# Patient Record
Sex: Female | Born: 1950 | Race: White | Hispanic: No | Marital: Single | Smoking: Never smoker
Health system: Southern US, Community
[De-identification: ages and names within clinical notes are randomized; demographics above are authoritative.]

## PROBLEM LIST (undated history)

## (undated) DIAGNOSIS — F70 Mild intellectual disabilities: Secondary | ICD-10-CM

## (undated) DIAGNOSIS — F329 Major depressive disorder, single episode, unspecified: Secondary | ICD-10-CM

## (undated) DIAGNOSIS — F32A Depression, unspecified: Secondary | ICD-10-CM

## (undated) DIAGNOSIS — I10 Essential (primary) hypertension: Secondary | ICD-10-CM

## (undated) DIAGNOSIS — E785 Hyperlipidemia, unspecified: Secondary | ICD-10-CM

## (undated) DIAGNOSIS — M810 Age-related osteoporosis without current pathological fracture: Secondary | ICD-10-CM

## (undated) HISTORY — PX: ABDOMINAL HYSTERECTOMY: SHX81

---

## 1999-03-29 ENCOUNTER — Emergency Department (HOSPITAL_COMMUNITY): Admission: EM | Admit: 1999-03-29 | Discharge: 1999-03-30 | Payer: Self-pay | Admitting: Emergency Medicine

## 1999-09-13 ENCOUNTER — Emergency Department (HOSPITAL_COMMUNITY): Admission: EM | Admit: 1999-09-13 | Discharge: 1999-09-13 | Payer: Self-pay | Admitting: Emergency Medicine

## 1999-09-20 ENCOUNTER — Emergency Department (HOSPITAL_COMMUNITY): Admission: EM | Admit: 1999-09-20 | Discharge: 1999-09-20 | Payer: Self-pay | Admitting: Emergency Medicine

## 2000-11-08 ENCOUNTER — Encounter (INDEPENDENT_AMBULATORY_CARE_PROVIDER_SITE_OTHER): Payer: Self-pay | Admitting: Specialist

## 2000-11-08 ENCOUNTER — Ambulatory Visit (HOSPITAL_COMMUNITY): Admission: RE | Admit: 2000-11-08 | Discharge: 2000-11-08 | Payer: Self-pay | Admitting: Gastroenterology

## 2002-11-20 ENCOUNTER — Ambulatory Visit (HOSPITAL_COMMUNITY): Admission: RE | Admit: 2002-11-20 | Discharge: 2002-11-20 | Payer: Self-pay | Admitting: Family Medicine

## 2002-11-29 ENCOUNTER — Encounter: Payer: Self-pay | Admitting: Emergency Medicine

## 2002-11-29 ENCOUNTER — Emergency Department (HOSPITAL_COMMUNITY): Admission: EM | Admit: 2002-11-29 | Discharge: 2002-11-29 | Payer: Self-pay | Admitting: Emergency Medicine

## 2002-12-23 ENCOUNTER — Ambulatory Visit (HOSPITAL_COMMUNITY): Admission: RE | Admit: 2002-12-23 | Discharge: 2002-12-23 | Payer: Self-pay | Admitting: Family Medicine

## 2002-12-23 ENCOUNTER — Encounter: Payer: Self-pay | Admitting: Family Medicine

## 2003-03-19 ENCOUNTER — Ambulatory Visit (HOSPITAL_COMMUNITY): Admission: RE | Admit: 2003-03-19 | Discharge: 2003-03-19 | Payer: Self-pay | Admitting: Family Medicine

## 2003-03-19 ENCOUNTER — Encounter: Payer: Self-pay | Admitting: Family Medicine

## 2005-04-18 ENCOUNTER — Ambulatory Visit (HOSPITAL_COMMUNITY): Admission: RE | Admit: 2005-04-18 | Discharge: 2005-04-18 | Payer: Self-pay | Admitting: *Deleted

## 2005-04-18 ENCOUNTER — Encounter (INDEPENDENT_AMBULATORY_CARE_PROVIDER_SITE_OTHER): Payer: Self-pay | Admitting: Specialist

## 2006-03-12 ENCOUNTER — Ambulatory Visit (HOSPITAL_COMMUNITY): Admission: RE | Admit: 2006-03-12 | Discharge: 2006-03-12 | Payer: Self-pay | Admitting: Family Medicine

## 2007-04-09 ENCOUNTER — Ambulatory Visit (HOSPITAL_COMMUNITY): Admission: RE | Admit: 2007-04-09 | Discharge: 2007-04-09 | Payer: Self-pay | Admitting: *Deleted

## 2008-04-07 ENCOUNTER — Ambulatory Visit (HOSPITAL_COMMUNITY): Admission: RE | Admit: 2008-04-07 | Discharge: 2008-04-07 | Payer: Self-pay | Admitting: Family Medicine

## 2008-09-01 ENCOUNTER — Encounter: Admission: RE | Admit: 2008-09-01 | Discharge: 2008-09-01 | Payer: Self-pay | Admitting: Family Medicine

## 2008-09-09 ENCOUNTER — Encounter: Admission: RE | Admit: 2008-09-09 | Discharge: 2008-09-09 | Payer: Self-pay | Admitting: Family Medicine

## 2008-09-21 ENCOUNTER — Encounter: Admission: RE | Admit: 2008-09-21 | Discharge: 2008-09-21 | Payer: Self-pay | Admitting: Gastroenterology

## 2008-09-21 ENCOUNTER — Encounter: Admission: RE | Admit: 2008-09-21 | Discharge: 2008-09-21 | Payer: Self-pay | Admitting: Family Medicine

## 2009-09-13 ENCOUNTER — Encounter: Admission: RE | Admit: 2009-09-13 | Discharge: 2009-09-13 | Payer: Self-pay | Admitting: Family Medicine

## 2010-06-13 ENCOUNTER — Ambulatory Visit (HOSPITAL_COMMUNITY): Admission: RE | Admit: 2010-06-13 | Discharge: 2010-06-13 | Payer: Self-pay | Admitting: Family Medicine

## 2010-08-07 ENCOUNTER — Encounter: Payer: Self-pay | Admitting: Family Medicine

## 2010-08-23 ENCOUNTER — Other Ambulatory Visit: Payer: Self-pay | Admitting: Family Medicine

## 2010-08-23 DIAGNOSIS — Z1231 Encounter for screening mammogram for malignant neoplasm of breast: Secondary | ICD-10-CM

## 2010-09-21 ENCOUNTER — Ambulatory Visit
Admission: RE | Admit: 2010-09-21 | Discharge: 2010-09-21 | Disposition: A | Discharge status: Home / Self Care | Payer: Self-pay | Source: Ambulatory Visit | Attending: Family Medicine | Admitting: Family Medicine

## 2010-09-21 DIAGNOSIS — Z1231 Encounter for screening mammogram for malignant neoplasm of breast: Secondary | ICD-10-CM

## 2010-09-23 ENCOUNTER — Other Ambulatory Visit: Payer: Self-pay | Admitting: Family Medicine

## 2010-09-23 DIAGNOSIS — R928 Other abnormal and inconclusive findings on diagnostic imaging of breast: Secondary | ICD-10-CM

## 2010-10-05 ENCOUNTER — Ambulatory Visit
Admission: RE | Admit: 2010-10-05 | Discharge: 2010-10-05 | Disposition: A | Discharge status: Home / Self Care | Payer: Self-pay | Source: Ambulatory Visit | Attending: Family Medicine | Admitting: Family Medicine

## 2010-10-05 DIAGNOSIS — R928 Other abnormal and inconclusive findings on diagnostic imaging of breast: Secondary | ICD-10-CM

## 2010-12-02 NOTE — Op Note (Signed)
[place]  Patient:    AAVA, DELAND                       MRN: [id number] Proc. Date: 11/08/00 Adm. Date:  [id number] Attending:  Orland Mustard CC:         Chales Salmon. Abigail Miyamoto, M.D.   Operative Report  PROCEDURE:  Colonoscopy and coagulation of polyps.  MEDICATIONS:  Fentanyl 50 mcg, Versed 5 mg IV.  INDICATION:  Polyps found on sigmoidoscopy in the rectum.  DESCRIPTION OF PROCEDURE:  The procedure had been explained to the patient prior to returning and consent obtained.  Placed in the left lateral decubitus position.  The Olympus adult video colonoscope inserted and advanced under direct visualization.  The prep was quite good.  We were able to advance to the cecum without difficulty.  Right lower quadrant transilluminated, ileocecal valve seen.  Scope withdrawn, and cecum, ascending colon, hepatic flexure, transverse colon, splenic flexure, descending, and sigmoid colon seen well.  In the rectum were two small 3-4 mm polyps that were cauterized and placed in a single jar.  The patient tolerated the procedure well, was maintained on low-flow oxygen and pulse oximetry.  ASSESSMENT:  Two small rectal polyps, cauterized.  PLAN:  Check pathology.  Routine postpolypectomy instructions. DD:  11/08/00 TD:  11/09/00 Job: [id number]

## 2010-12-02 NOTE — Op Note (Signed)
Amy Solis, Amy Solis                ACCOUNT NO.:  [id number]   MEDICAL RECORD NO.:  [id number]          PATIENT TYPE:  AMB   LOCATION:  SDC                           FACILITY:  [facility]   PHYSICIAN:  [place] B. Earlene Plater, M.D.  DATE OF BIRTH:  1951-07-06   DATE OF PROCEDURE:  04/18/2005  DATE OF DISCHARGE:                                 OPERATIVE REPORT   PREOPERATIVE DIAGNOSIS:  Persistent simple ovarian cyst.   POSTOPERATIVE DIAGNOSIS:  Persistent simple ovarian cyst.   PROCEDURE:  Laparoscopic bilateral salpingo-oophorectomy.   SURGEON:  Chester Holstein. Earlene Plater, M.D.   ASSISTANT:  Richardean Sale, M.D.   ANESTHESIA:  General.   SPECIMENS:  Bilateral tubes and ovaries.   ESTIMATED BLOOD LOSS:  Minimal.   COMPLICATIONS:  None.   INDICATIONS:  Patient with a history of a persistent simple-appearing  adnexal cyst.  Laterality was not clear on ultrasound but was suggestive of  emanating from the right.  Regardless, the patient is menopausal and we plan  to proceed with bilateral salpingo-oophorectomy.  Preop CA125 was normal.  Ultrasound appearance was of a simple 8 cm cyst with no complexity  whatsoever and no increased blood flow.  The patient had been counseled  preoperatively that there was a very low risk of malignancy and unless  intraoperative findings changed that opinion, we would proceed  laparoscopically.  Risks of surgery discussed including bleeding, infection  and damage to surrounding organs.   PROCEDURE:  The patient was taken to the operating room and general  anesthesia obtained.  Placed in the Perkins stirrups and prepped and draped in  standard fashion.  A Foley catheter inserted in the bladder.   A 10 mm incision placed in the umbilicus, carried sharply to the fascia.  The fascia was divided sharply and elevated with Kocher clamps.  Posterior  sheath and the peritoneum elevated with an Allis clamp and entered sharply  with a knife.  A pursestring suture of 0 Vicryl placed  around the fascial  defect.  Hasson cannula inserted and secured.  Pneumoperitoneum attained  with CO2 gas.  Five millimeter ports were placed in the midline  suprapubically and right lower quadrant.  An 11 mm XL trocar placed in the  left lower quadrant, all under direct laparoscopic visualization.   Trendelenburg position obtained, bowel mobilized superiorly.  There were  filmy adhesions from the sigmoid colon to the left adnexa.  This was taken  down sharply.  With inspection of the pelvis, it was determined that the  cystic mass was emanating from the left tube as a large benign simple-  appearing paratubal cyst.  Both ovaries appeared completely normal, as did  the anterior and posterior cul-de-sac, uterus, appendix and upper abdomen.  There was no sign of malignancy whatsoever.  Pelvic washings were obtained.   The left paratubal cyst was incised at its dome and drained with suction.  The left adnexa was then placed on traction.  Course of the left ureter  identified.  The IP ligament cauterized with bipolar and divided.  Dissection continued toward the uterine cornu.  The  tube and the uterine-  ovarian ligament were cauterized and divided.  The specimen was removed in  an Endobag.   Attention turned to the right ovary.  The course of the right ureter  identified and found to be well away.  Right IP ligament placed on traction,  cauterized with bipolar and divided.  Dissection continued back up to the  uterine-ovarian ligament and left tube.  These were all divided with  electrocauterization with bipolar.   The pelvis was irrigated and sites of dissection inspected, and they were  hemostatic.  The trocars were removed and their sites inspected.  Each was  hemostatic.  As the 11 mm port was an XL trocar placed in a Z-track fashion,  fascial closure was not deemed necessary.   Gas removed and hasson canula removed.  Fascia closed with the pursestring  suture.  The skin at the  umbilicus was closed with a layer of running  subcuticular 4-0 Vicryl.  The superior ports were closed with Dermabond.   The patient tolerated the procedure well, and there were no complications.  She was taken to the recovery room awake, alert and in stable condition.      Gerri Spore B. Earlene Plater, M.D.  Electronically Signed     WBD/MEDQ  D:  04/18/2005  T:  04/18/2005  Job:  [id number]

## 2011-08-02 ENCOUNTER — Other Ambulatory Visit: Payer: Self-pay | Admitting: Family Medicine

## 2011-08-02 DIAGNOSIS — Z1231 Encounter for screening mammogram for malignant neoplasm of breast: Secondary | ICD-10-CM

## 2011-09-26 ENCOUNTER — Ambulatory Visit
Admission: RE | Admit: 2011-09-26 | Discharge: 2011-09-26 | Disposition: A | Discharge status: Home / Self Care | Payer: Self-pay | Source: Ambulatory Visit | Attending: Family Medicine | Admitting: Family Medicine

## 2011-09-26 DIAGNOSIS — Z1231 Encounter for screening mammogram for malignant neoplasm of breast: Secondary | ICD-10-CM

## 2013-04-25 ENCOUNTER — Other Ambulatory Visit: Payer: Self-pay | Admitting: Nurse Practitioner

## 2013-04-25 DIAGNOSIS — Z1231 Encounter for screening mammogram for malignant neoplasm of breast: Secondary | ICD-10-CM

## 2013-05-13 ENCOUNTER — Ambulatory Visit: Payer: Self-pay

## 2013-05-15 ENCOUNTER — Ambulatory Visit
Admission: RE | Admit: 2013-05-15 | Discharge: 2013-05-15 | Disposition: A | Discharge status: Home / Self Care | Payer: BC Managed Care – PPO | Source: Ambulatory Visit | Attending: Nurse Practitioner | Admitting: Nurse Practitioner

## 2013-05-15 DIAGNOSIS — Z1231 Encounter for screening mammogram for malignant neoplasm of breast: Secondary | ICD-10-CM

## 2013-06-16 ENCOUNTER — Emergency Department (HOSPITAL_COMMUNITY): Payer: BC Managed Care – PPO

## 2013-06-16 ENCOUNTER — Encounter (HOSPITAL_COMMUNITY): Payer: Self-pay | Admitting: Emergency Medicine

## 2013-06-16 ENCOUNTER — Emergency Department (HOSPITAL_COMMUNITY)
Admission: EM | Admit: 2013-06-16 | Discharge: 2013-06-16 | Disposition: A | Discharge status: Home / Self Care | Payer: BC Managed Care – PPO | Attending: Emergency Medicine | Admitting: Emergency Medicine

## 2013-06-16 DIAGNOSIS — W010XXA Fall on same level from slipping, tripping and stumbling without subsequent striking against object, initial encounter: Secondary | ICD-10-CM

## 2013-06-16 DIAGNOSIS — M810 Age-related osteoporosis without current pathological fracture: Secondary | ICD-10-CM | POA: Insufficient documentation

## 2013-06-16 DIAGNOSIS — I10 Essential (primary) hypertension: Secondary | ICD-10-CM | POA: Insufficient documentation

## 2013-06-16 DIAGNOSIS — Z79899 Other long term (current) drug therapy: Secondary | ICD-10-CM | POA: Insufficient documentation

## 2013-06-16 DIAGNOSIS — S0003XA Contusion of scalp, initial encounter: Secondary | ICD-10-CM | POA: Insufficient documentation

## 2013-06-16 DIAGNOSIS — Y9210 Unspecified residential institution as the place of occurrence of the external cause: Secondary | ICD-10-CM | POA: Insufficient documentation

## 2013-06-16 DIAGNOSIS — W1809XA Striking against other object with subsequent fall, initial encounter: Secondary | ICD-10-CM | POA: Insufficient documentation

## 2013-06-16 DIAGNOSIS — Z7982 Long term (current) use of aspirin: Secondary | ICD-10-CM | POA: Insufficient documentation

## 2013-06-16 DIAGNOSIS — F79 Unspecified intellectual disabilities: Secondary | ICD-10-CM | POA: Insufficient documentation

## 2013-06-16 DIAGNOSIS — S0093XA Contusion of unspecified part of head, initial encounter: Secondary | ICD-10-CM

## 2013-06-16 DIAGNOSIS — Y939 Activity, unspecified: Secondary | ICD-10-CM | POA: Insufficient documentation

## 2013-06-16 HISTORY — DX: Depression, unspecified: F32.A

## 2013-06-16 HISTORY — DX: Age-related osteoporosis without current pathological fracture: M81.0

## 2013-06-16 HISTORY — DX: Mild intellectual disabilities: F70

## 2013-06-16 HISTORY — DX: Hyperlipidemia, unspecified: E78.5

## 2013-06-16 HISTORY — DX: Essential (primary) hypertension: I10

## 2013-06-16 HISTORY — DX: Major depressive disorder, single episode, unspecified: F32.9

## 2013-06-16 LAB — CBC
HCT: 41.2 % (ref 36.0–46.0)
Hemoglobin: 13.9 g/dL (ref 12.0–15.0)
MCH: 31.9 pg (ref 26.0–34.0)
MCHC: 33.7 g/dL (ref 30.0–36.0)
MCV: 94.5 fL (ref 78.0–100.0)
Platelets: 264 10*3/uL (ref 150–400)
RBC: 4.36 MIL/uL (ref 3.87–5.11)
WBC: 3.7 10*3/uL — ABNORMAL LOW (ref 4.0–10.5)

## 2013-06-16 LAB — BLOOD GAS, ARTERIAL
Acid-Base Excess: 1.7 mmol/L (ref 0.0–2.0)
Bicarbonate: 24.9 mEq/L — ABNORMAL HIGH (ref 20.0–24.0)
Drawn by: 310571
O2 Saturation: 92.4 %
pCO2 arterial: 36.5 mmHg (ref 35.0–45.0)
pH, Arterial: 7.449 (ref 7.350–7.450)

## 2013-06-16 LAB — COMPREHENSIVE METABOLIC PANEL
ALT: 16 U/L (ref 0–35)
AST: 17 U/L (ref 0–37)
CO2: 24 mEq/L (ref 19–32)
Calcium: 9.7 mg/dL (ref 8.4–10.5)
Chloride: 105 mEq/L (ref 96–112)
Creatinine, Ser: 0.8 mg/dL (ref 0.50–1.10)
GFR calc Af Amer: 88 mL/min — ABNORMAL LOW (ref >90)
GFR calc non Af Amer: 76 mL/min — ABNORMAL LOW (ref >90)
Glucose, Bld: 107 mg/dL — ABNORMAL HIGH (ref 70–99)
Total Bilirubin: 0.5 mg/dL (ref 0.3–1.2)

## 2013-06-16 NOTE — ED Notes (Signed)
Pt Oxygen sat 86%, called group home to check if pt was on oxygen there or any respiratory issues but pt has no respiratory issues or O2 at home. Pt denies SOB.

## 2013-06-16 NOTE — ED Provider Notes (Signed)
CSN: [id number]     Arrival date & time 06/16/13  7253 History   First MD Initiated Contact with Patient 06/16/13 [phone]     Chief Complaint  Patient presents with  . Fall   (Consider location/radiation/quality/duration/timing/severity/associated sxs/prior Treatment) Patient is a 62 y.o. female presenting with fall. The history is provided by the patient and a caregiver.  Fall Associated symptoms include headaches. Pertinent negatives include no chest pain, no abdominal pain and no shortness of breath.  pt with hx htn, mr, from group home. Fall at group home today. Was standing and fell back, hit head, dazed, c/o headache since, moderate. Staff at group home also report fall in past couple weeks where hit head very hard on counter - pt cant recall specifics of that fall. At baseline pt walks w walker, somewhat slow/unsteadily, fall risk.  Pt denies neck or back pain. No nv. No numbness/weakness. Pt denies any other pain or injury.  No anticoag use.  States otherwise recent health at baseline w exception that had recent cough/congestion. 'cold', but pt/staff feels those symptoms have improved. No fever.      Past Medical History  Diagnosis Date  . Hypertension   . Hyperlipemia   . Depressive disorder   . Osteoporosis   . Mild mental retardation    History reviewed. No pertinent past surgical history. No family history on file. History  Substance Use Topics  . Smoking status: Never Smoker   . Smokeless tobacco: Not on file  . Alcohol Use: No   OB History   Grav Para Term Preterm Abortions TAB SAB Ect Mult Living                 Review of Systems  Constitutional: Negative for fever and chills.  HENT: Negative for sore throat.   Eyes: Negative for visual disturbance.  Respiratory: Negative for shortness of breath.   Cardiovascular: Negative for chest pain.  Gastrointestinal: Negative for vomiting and abdominal pain.  Genitourinary: Negative for flank pain.  Musculoskeletal:  Negative for back pain and neck pain.  Skin: Negative for wound.  Neurological: Positive for headaches. Negative for weakness and numbness.  Hematological: Does not bruise/bleed easily.  Psychiatric/Behavioral: Negative for confusion.    Allergies  Review of patient's allergies indicates no known allergies.  Home Medications   Current Outpatient Rx  Name  Route  Sig  Dispense  Refill  . amLODipine-benazepril (LOTREL) 5-20 MG per capsule   Oral   Take 1 capsule by mouth daily.         Marland Kitchen ammonium lactate (LAC-HYDRIN) 12 % lotion   Topical   Apply 1 application topically 2 (two) times daily.         Marland Kitchen aspirin 81 MG tablet   Oral   Take 81 mg by mouth daily.         . Calcium Carb-Cholecalciferol (CALCIUM 600 + D PO)   Oral   Take 1 tablet by mouth 2 (two) times daily.         Marland Kitchen gabapentin (NEURONTIN) 800 MG tablet   Oral   Take 0.5 tablets by mouth 2 (two) times daily.         . Multiple Vitamins-Minerals (CEROVITE PO)   Oral   Take 1 tablet by mouth daily.         Marland Kitchen omeprazole (PRILOSEC) 20 MG capsule   Oral   Take 1 capsule by mouth daily.         Marland Kitchen oxybutynin (DITROPAN)  5 MG tablet   Oral   Take 1 tablet by mouth daily.         Marland Kitchen PARoxetine (PAXIL) 40 MG tablet   Oral   Take 1 tablet by mouth daily.         Bertram Gala Glycol-Propyl Glycol (SYSTANE) 0.4-0.3 % SOLN   Ophthalmic   Apply 1 drop to eye 2 (two) times daily.         . risperiDONE (RISPERDAL) 1 MG tablet   Oral   Take 1 tablet by mouth daily.         . simvastatin (ZOCOR) 20 MG tablet   Oral   Take 1 tablet by mouth daily.          BP 122/77  Pulse 86  Temp(Src) 97.9 F (36.6 C) (Oral)  Resp 16  SpO2 85% Physical Exam  Nursing note and vitals reviewed. Constitutional: She appears well-developed and well-nourished. No distress.  HENT:  Mouth/Throat: Oropharynx is clear and moist.  Contusion, tenderness posterior scalp.   Eyes: Conjunctivae are normal. Pupils  are equal, round, and reactive to light. No scleral icterus.  Neck: Normal range of motion. Neck supple. No tracheal deviation present.  Cardiovascular: Normal rate, normal heart sounds and intact distal pulses.  Exam reveals no gallop and no friction rub.   No murmur heard. Pulmonary/Chest: Effort normal and breath sounds normal. No respiratory distress.  Abdominal: Soft. Normal appearance. She exhibits no distension. There is no tenderness.  Musculoskeletal: She exhibits no edema and no tenderness.  CTLS spine, non tender, aligned, no step off. Good rom bil extremities without pain or focal tenderness.   Neurological: She is alert.  Alert, smiling, pleasant, responds to questions.  Mental status c/w baseline per caregiver/staff.   Motor intact bil, 5/5.   Skin: Skin is warm and dry. No rash noted. She is not diaphoretic.  Psychiatric: She has a normal mood and affect.    ED Course  Procedures (including critical care time)  EKG Interpretation   None      Results for orders placed during the hospital encounter of 06/16/13  CBC      Result Value Range   WBC 3.7 (*) 4.0 - 10.5 K/uL   RBC 4.36  3.87 - 5.11 MIL/uL   Hemoglobin 13.9  12.0 - 15.0 g/dL   HCT 16.1  09.6 - 04.5 %   MCV 94.5  78.0 - 100.0 fL   MCH 31.9  26.0 - 34.0 pg   MCHC 33.7  30.0 - 36.0 g/dL   RDW 40.9  81.1 - 91.4 %   Platelets 264  150 - 400 K/uL  COMPREHENSIVE METABOLIC PANEL      Result Value Range   Sodium 139  135 - 145 mEq/L   Potassium 4.1  3.5 - 5.1 mEq/L   Chloride 105  96 - 112 mEq/L   CO2 24  19 - 32 mEq/L   Glucose, Bld 107 (*) 70 - 99 mg/dL   BUN 17  6 - 23 mg/dL   Creatinine, Ser 7.82  0.50 - 1.10 mg/dL   Calcium 9.7  8.4 - 95.6 mg/dL   Total Protein 6.8  6.0 - 8.3 g/dL   Albumin 3.3 (*) 3.5 - 5.2 g/dL   AST 17  0 - 37 U/L   ALT 16  0 - 35 U/L   Alkaline Phosphatase 83  39 - 117 U/L   Total Bilirubin 0.5  0.3 - 1.2 mg/dL   GFR  calc non Af Amer 76 (*) >90 mL/min   GFR calc Af Amer 88  (*) >90 mL/min  BLOOD GAS, ARTERIAL      Result Value Range   pH, Arterial 7.449  7.350 - 7.450   pCO2 arterial 36.5  35.0 - 45.0 mmHg   pO2, Arterial 62.7 (*) 80.0 - 100.0 mmHg   Bicarbonate 24.9 (*) 20.0 - 24.0 mEq/L   TCO2 21.6  0 - 100 mmol/L   Acid-Base Excess 1.7  0.0 - 2.0 mmol/L   O2 Saturation 92.4     Patient temperature 98.6     Collection site LEFT RADIAL     Drawn by [id number]     Sample type ARTERIAL DRAW     Allens test (pass/fail) PASS  PASS   Dg Chest 2 View  06/16/2013   CLINICAL DATA:  Fall  EXAM: CHEST  2 VIEW  COMPARISON:  None.  FINDINGS: Cardiomediastinal silhouette is unremarkable. No acute infiltrate or pulmonary edema. Mild basilar atelectasis. Bony thorax is unremarkable.  IMPRESSION: No acute infiltrate or pulmonary edema.  Mild basilar atelectasis.   Electronically Signed   By: [name] Mead M.D.   On: 06/16/2013 10:59   Ct Head Wo Contrast  06/16/2013   CLINICAL DATA:  Head injury and pain after fall.  EXAM: CT HEAD WITHOUT CONTRAST  TECHNIQUE: Contiguous axial images were obtained from the base of the skull through the vertex without intravenous contrast.  COMPARISON:  Report of MRI of March 19, 2003. Images are not available.  FINDINGS: Bony calvarium appears intact. As noted on prior MRI exam, there appears to be partial agenesis of the corpus callosum. No mass effect or midline shift is noted. Two focal low densities are noted in the left cerebellar hemisphere consistent with old infarctions. There is no evidence of mass lesion, hemorrhage or acute infarction.  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 No acute intracranial abnormality seen.   Electronically Signed   By: Roque Lias M.D.   On: 06/16/2013 11:06      MDM  Ct.  Nursing notes low pulse ox on a couple different monitors, although small/scarred nails, some w polish. Will add cxr and labs. Pt  denies dyspnea.   Reviewed nursing notes and prior charts for additional history.   On room air abg, sat 92%, po 63.   Checked w pt on several occasions, pt denies any sob, or unusual doe or fatigue.  Pt states she feels she is breathing at baseline. Color good/normal.   Pt presents w mech fall, workup neg for acute traumatic inj.   Pt continues to deny any dyspnea/asymptomatic from resp standpoint - given marginal/low po2, borderline hypoxia on room air abg, will have f/u as outpt w pcp Cyndia Bent).     Suzi Roots, MD 06/16/13 [phone]

## 2013-06-16 NOTE — ED Notes (Signed)
Pt from group home. Pt was standing today and just fell back wards hitting back of head. Pt's caregiver states no LOC. Pt denies pain.

## 2013-06-22 ENCOUNTER — Encounter (HOSPITAL_COMMUNITY): Payer: Self-pay | Admitting: Emergency Medicine

## 2013-06-22 ENCOUNTER — Emergency Department (HOSPITAL_COMMUNITY): Payer: BC Managed Care – PPO

## 2013-06-22 ENCOUNTER — Emergency Department (HOSPITAL_COMMUNITY)
Admission: EM | Admit: 2013-06-22 | Discharge: 2013-06-22 | Disposition: A | Discharge status: Home / Self Care | Payer: BC Managed Care – PPO | Attending: Emergency Medicine | Admitting: Emergency Medicine

## 2013-06-22 DIAGNOSIS — L988 Other specified disorders of the skin and subcutaneous tissue: Secondary | ICD-10-CM | POA: Insufficient documentation

## 2013-06-22 DIAGNOSIS — F329 Major depressive disorder, single episode, unspecified: Secondary | ICD-10-CM | POA: Insufficient documentation

## 2013-06-22 DIAGNOSIS — Z7982 Long term (current) use of aspirin: Secondary | ICD-10-CM | POA: Insufficient documentation

## 2013-06-22 DIAGNOSIS — E785 Hyperlipidemia, unspecified: Secondary | ICD-10-CM | POA: Insufficient documentation

## 2013-06-22 DIAGNOSIS — F3289 Other specified depressive episodes: Secondary | ICD-10-CM | POA: Insufficient documentation

## 2013-06-22 DIAGNOSIS — R0902 Hypoxemia: Secondary | ICD-10-CM

## 2013-06-22 DIAGNOSIS — Z79899 Other long term (current) drug therapy: Secondary | ICD-10-CM | POA: Insufficient documentation

## 2013-06-22 DIAGNOSIS — I10 Essential (primary) hypertension: Secondary | ICD-10-CM | POA: Insufficient documentation

## 2013-06-22 DIAGNOSIS — Z8739 Personal history of other diseases of the musculoskeletal system and connective tissue: Secondary | ICD-10-CM | POA: Insufficient documentation

## 2013-06-22 LAB — BASIC METABOLIC PANEL
BUN: 15 mg/dL (ref 6–23)
Calcium: 9.5 mg/dL (ref 8.4–10.5)
Chloride: 103 mEq/L (ref 96–112)
Creatinine, Ser: 0.76 mg/dL (ref 0.50–1.10)
GFR calc non Af Amer: 87 mL/min — ABNORMAL LOW (ref >90)
Glucose, Bld: 94 mg/dL (ref 70–99)
Sodium: 134 mEq/L — ABNORMAL LOW (ref 135–145)

## 2013-06-22 LAB — CBC WITH DIFFERENTIAL/PLATELET
Basophils Absolute: 0 10*3/uL (ref 0.0–0.1)
Eosinophils Absolute: 0.1 10*3/uL (ref 0.0–0.7)
Eosinophils Relative: 3 % (ref 0–5)
Lymphocytes Relative: 38 % (ref 12–46)
MCH: 32.9 pg (ref 26.0–34.0)
MCV: 95.2 fL (ref 78.0–100.0)
Monocytes Absolute: 0.7 10*3/uL (ref 0.1–1.0)
Monocytes Relative: 15 % — ABNORMAL HIGH (ref 3–12)
Platelets: 234 10*3/uL (ref 150–400)
RDW: 12.8 % (ref 11.5–15.5)

## 2013-06-22 LAB — BLOOD GAS, VENOUS
Acid-Base Excess: 1.6 mmol/L (ref 0.0–2.0)
Patient temperature: 98.6
TCO2: 23.1 mmol/L (ref 0–100)
pCO2, Ven: 44.6 mmHg — ABNORMAL LOW (ref 45.0–50.0)

## 2013-06-22 LAB — CARBOXYHEMOGLOBIN
Methemoglobin: 1.8 % — ABNORMAL HIGH (ref 0.0–1.5)
O2 Saturation: 63.4 %
Total hemoglobin: 16 g/dL (ref 12.0–16.0)

## 2013-06-22 NOTE — ED Notes (Signed)
Pt was brought in from nursing home with staff they reported that pts ox level was low. When checked it was 95%  RA. Pt denies any sob, pt denies any c/o . The tech with her states that she is her for SOB.

## 2013-06-22 NOTE — ED Provider Notes (Signed)
CSN: [id number]     Arrival date & time 06/22/13  1341 History   First MD Initiated Contact with Patient 06/22/13 1507     Chief Complaint  Patient presents with  . Shortness of Breath   HPI  Patient p/w staff concerns of possible hypoxia and new discoloration about the R third digit. Patient lives at a group home, and staff provides much of HPI. Patient was seen one week ago for a fall and found to have borderline hypoxia during that evaluation. Staff has not taken the patient to her PMD since that evaluation (one of the discharge instructions.) The patient herself has no complaints.  (Staff states that this is typical for this lady). Staff denies new cough, fever, emesis, diarrhea, behavior changes.  The patient does note that her R third digit seems "darker".  No pain, no sensory loss, no other affected digits.  Past Medical History  Diagnosis Date  . Hypertension   . Hyperlipemia   . Depressive disorder   . Osteoporosis   . Mild mental retardation    History reviewed. No pertinent past surgical history. No family history on file. History  Substance Use Topics  . Smoking status: Never Smoker   . Smokeless tobacco: Not on file  . Alcohol Use: No   OB History   Grav Para Term Preterm Abortions TAB SAB Ect Mult Living                 Review of Systems  Unable to perform ROS: Other  Patient with retardation - LEVEL FIVE  Allergies  Review of patient's allergies indicates no known allergies.  Home Medications   Current Outpatient Rx  Name  Route  Sig  Dispense  Refill  . amLODipine-benazepril (LOTREL) 5-20 MG per capsule   Oral   Take 1 capsule by mouth daily.         Marland Kitchen ammonium lactate (LAC-HYDRIN) 12 % lotion   Topical   Apply 1 application topically 2 (two) times daily.         Marland Kitchen aspirin 81 MG tablet   Oral   Take 81 mg by mouth daily.         . Calcium Carb-Cholecalciferol (CALCIUM 600 + D PO)   Oral   Take 1 tablet by mouth 2 (two) times  daily.         Marland Kitchen gabapentin (NEURONTIN) 800 MG tablet   Oral   Take 400 tablets by mouth 2 (two) times daily.          . Multiple Vitamins-Minerals (CEROVITE PO)   Oral   Take 1 tablet by mouth daily.         Marland Kitchen omeprazole (PRILOSEC) 20 MG capsule   Oral   Take 1 capsule by mouth daily.         Marland Kitchen oxybutynin (DITROPAN) 5 MG tablet   Oral   Take 1 tablet by mouth daily.         Marland Kitchen PARoxetine (PAXIL) 40 MG tablet   Oral   Take 1 tablet by mouth daily.         Bertram Gala Glycol-Propyl Glycol (SYSTANE) 0.4-0.3 % SOLN   Ophthalmic   Apply 1 drop to eye 2 (two) times daily.         . risperiDONE (RISPERDAL) 1 MG tablet   Oral   Take 1 tablet by mouth daily.         . simvastatin (ZOCOR) 20 MG tablet   Oral  Take 1 tablet by mouth daily.          BP 151/93  Pulse 96  Temp(Src) 97.5 F (36.4 C) (Oral)  SpO2 95% Physical Exam  Nursing note and vitals reviewed. Constitutional: She is oriented to person, place, and time. She appears well-developed and well-nourished. No distress.  HENT:  Head: Normocephalic and atraumatic.  Eyes: Conjunctivae and EOM are normal.  Cardiovascular: Normal rate, regular rhythm, intact distal pulses and normal pulses.   Cap refill in affected digit is appropriate.  Pulmonary/Chest: Effort normal and breath sounds normal. No stridor. No respiratory distress.  Abdominal: She exhibits no distension.  Musculoskeletal: She exhibits no edema.  Neurological: She is alert and oriented to person, place, and time. No cranial nerve deficit.  Skin: Skin is warm and dry.     Psychiatric: She has a normal mood and affect. Her speech is normal and behavior is normal. Cognition and memory are impaired.    ED Course  Procedures (including critical care time) Labs Review Labs Reviewed  CBC WITH DIFFERENTIAL - Abnormal; Notable for the following:    Hemoglobin 15.9 (*)    Monocytes Relative 15 (*)    All other components within normal  limits  BLOOD GAS, VENOUS  BASIC METABOLIC PANEL  CARBOXYHEMOGLOBIN   Imaging Review Dg Chest 2 View  06/22/2013   CLINICAL DATA:  Hypoxia  EXAM: CHEST  2 VIEW  COMPARISON:  06/16/2013  FINDINGS: Study is limited by poor inspiration. Cardiomegaly again noted. Hazy bilateral basilar atelectasis or infiltrate. No pulmonary edema. Small amount of fluid or thickening noted right minor fissure.  IMPRESSION: Limited study by poor inspiration. Hazy bilateral basilar atelectasis or infiltrate. No pulmonary edema.   Electronically Signed   By: [name] Mead M.D.   On: 06/22/2013 15:53    EKG Interpretation   None      After the initial eval I reviewed the E-chart.   O2- 92%ra, low / abnormal  7:45 PM Patient remains in in no distress.  Oxygen saturation is improved. MDM  No diagnosis found. This patient presents with concern for possible hypoxic.  Notably, the patient is afebrile, with no complaints.  However, given the patient's mental retardation, differential was considered, given the initial abnormal vital signs as well.  Patient remained stable throughout her emergency department course.  Repeat x-ray did not demonstrate significant opacification, and the patient is afebrile, with no cough suggesting infection.  Patient has previous did not follow up with her primary care physician, in spite of explicit discharge instructions 1 week ago.  The importance primary care followup in the coming days was reiterated to the patient and her accompanying staff member.  Given the absence of distress, currently normal vital signs, the reassuring laboratory findings, she was discharged in stable condition to follow up with her primary care physician.    Gerhard Munch, MD 06/22/13 [phone]

## 2014-02-23 ENCOUNTER — Other Ambulatory Visit: Payer: Self-pay | Admitting: Family Medicine

## 2014-02-23 DIAGNOSIS — Z1231 Encounter for screening mammogram for malignant neoplasm of breast: Secondary | ICD-10-CM

## 2014-05-20 ENCOUNTER — Encounter (INDEPENDENT_AMBULATORY_CARE_PROVIDER_SITE_OTHER): Payer: Self-pay

## 2014-05-20 ENCOUNTER — Ambulatory Visit
Admission: RE | Admit: 2014-05-20 | Discharge: 2014-05-20 | Disposition: A | Discharge status: Home / Self Care | Payer: Medicare Other | Source: Ambulatory Visit | Attending: Family Medicine | Admitting: Family Medicine

## 2014-05-20 DIAGNOSIS — Z1231 Encounter for screening mammogram for malignant neoplasm of breast: Secondary | ICD-10-CM

## 2015-04-26 ENCOUNTER — Other Ambulatory Visit: Payer: Self-pay

## 2015-04-26 DIAGNOSIS — Z1231 Encounter for screening mammogram for malignant neoplasm of breast: Secondary | ICD-10-CM

## 2015-06-07 ENCOUNTER — Ambulatory Visit
Admission: RE | Admit: 2015-06-07 | Discharge: 2015-06-07 | Disposition: A | Discharge status: Home / Self Care | Payer: Medicare Other | Source: Ambulatory Visit

## 2015-06-07 DIAGNOSIS — Z1231 Encounter for screening mammogram for malignant neoplasm of breast: Secondary | ICD-10-CM

## 2015-06-17 ENCOUNTER — Emergency Department (HOSPITAL_COMMUNITY)
Admission: EM | Admit: 2015-06-17 | Discharge: 2015-06-17 | Disposition: A | Discharge status: Home / Self Care | Payer: Medicare Other | Attending: Emergency Medicine | Admitting: Emergency Medicine

## 2015-06-17 ENCOUNTER — Encounter (HOSPITAL_COMMUNITY): Payer: Self-pay

## 2015-06-17 DIAGNOSIS — E785 Hyperlipidemia, unspecified: Secondary | ICD-10-CM | POA: Insufficient documentation

## 2015-06-17 DIAGNOSIS — I10 Essential (primary) hypertension: Secondary | ICD-10-CM | POA: Insufficient documentation

## 2015-06-17 DIAGNOSIS — R220 Localized swelling, mass and lump, head: Secondary | ICD-10-CM | POA: Insufficient documentation

## 2015-06-17 DIAGNOSIS — R7981 Abnormal blood-gas level: Secondary | ICD-10-CM

## 2015-06-17 DIAGNOSIS — Z7982 Long term (current) use of aspirin: Secondary | ICD-10-CM | POA: Insufficient documentation

## 2015-06-17 DIAGNOSIS — R229 Localized swelling, mass and lump, unspecified: Secondary | ICD-10-CM

## 2015-06-17 DIAGNOSIS — R0902 Hypoxemia: Secondary | ICD-10-CM | POA: Insufficient documentation

## 2015-06-17 DIAGNOSIS — Z79899 Other long term (current) drug therapy: Secondary | ICD-10-CM | POA: Insufficient documentation

## 2015-06-17 DIAGNOSIS — R5383 Other fatigue: Secondary | ICD-10-CM | POA: Diagnosis present

## 2015-06-17 DIAGNOSIS — M810 Age-related osteoporosis without current pathological fracture: Secondary | ICD-10-CM | POA: Diagnosis not present

## 2015-06-17 DIAGNOSIS — F329 Major depressive disorder, single episode, unspecified: Secondary | ICD-10-CM | POA: Diagnosis not present

## 2015-06-17 NOTE — Discharge Instructions (Signed)
Hives Hives are itchy, red, swollen areas of the skin. They can vary in size and location on your body. Hives can come and go for hours or several days (acute hives) or for several weeks (chronic hives). Hives do not spread from person to person (noncontagious). They may get worse with scratching, exercise, and emotional stress. CAUSES   Allergic reaction to food, additives, or drugs.  Infections, including the common cold.  Illness, such as vasculitis, lupus, or thyroid disease.  Exposure to sunlight, heat, or cold.  Exercise.  Stress.  Contact with chemicals. SYMPTOMS   Red or white swollen patches on the skin. The patches may change size, shape, and location quickly and repeatedly.  Itching.  Swelling of the hands, feet, and face. This may occur if hives develop deeper in the skin. DIAGNOSIS  Your caregiver can usually tell what is wrong by performing a physical exam. Skin or blood tests may also be done to determine the cause of your hives. In some cases, the cause cannot be determined. TREATMENT  Mild cases usually get better with medicines such as antihistamines. Severe cases may require an emergency epinephrine injection. If the cause of your hives is known, treatment includes avoiding that trigger.  HOME CARE INSTRUCTIONS   Avoid causes that trigger your hives.  Take antihistamines as directed by your caregiver to reduce the severity of your hives. Non-sedating or low-sedating antihistamines are usually recommended. Do not drive while taking an antihistamine.  Take any other medicines prescribed for itching as directed by your caregiver.  Wear loose-fitting clothing.  Keep all follow-up appointments as directed by your caregiver. SEEK MEDICAL CARE IF:   You have persistent or severe itching that is not relieved with medicine.  You have painful or swollen joints. SEEK IMMEDIATE MEDICAL CARE IF:   You have a fever.  Your tongue or lips are swollen.  You have  trouble breathing or swallowing.  You feel tightness in the throat or chest.  You have abdominal pain. These problems may be the first sign of a life-threatening allergic reaction. Call your local emergency services (911 in [place] MAKE SURE YOU:   Understand these instructions.  Will watch your condition.  Will get help right away if you are not doing well or get worse.   This information is not intended to replace advice given to you by your health care provider. Make sure you discuss any questions you have with your health care provider.   Document Released: 07/03/2005 Document Revised: 07/08/2013 Document Reviewed: 09/26/2011 Elsevier Interactive Patient Education 2016 Elsevier Inc. Chronic Hypoxemia Hypoxemia occurs when your blood does not contain enough oxygen. The body cannot work well when it does not have enough oxygen because every part of your body needs oxygen. Oxygen travels to all parts of the body through your blood. Hypoxemia can develop suddenly or can come on slowly. CAUSES Some common causes of hypoxemia include:  Long-term (chronic) lung diseases, such as chronic obstructive pulmonary disease (COPD) or interstitial lung disease.  Disorders that affect breathing at night, such as sleep apnea.  Fluid buildup in your lungs (pulmonary edema).  Lung infection (pneumonia).  Lung or throat cancer.  Abnormal blood flow that bypasses the lungs (shunt).  Certain diseasesthat affect nerves or muscles.  A collapsed lung (pneumothorax).  A blood clot in the lungs (pulmonary embolus).  Certain types of heart disease.  Slow or shallow breathing (hypoventilation).  Certain medicines.  High altitudes.  Toxic chemicals and gases. SIGNS AND SYMPTOMS Not  everyone who has hypoxemia will develop symptoms. If the hypoxemia developed quickly, you will likely have symptoms such as shortness of breath. If the hypoxemia came on slowly over months or years, you may not  notice any symptoms. Symptoms can include:  Shortness of breath (dyspnea).  Bluish color of the skin, lips, or nail beds.  Breathing that is fast, noisy, or shallow.  A fast heartbeat.  Feeling tired or sleepy.  Being confused or feeling anxious. DIAGNOSIS To determine if you have hypoxemia, your health care provider may perform:  A physical exam.  Blood tests.  A pulse oximetry. A sensor will be put on your finger, toe, or earlobe to measure the percent of oxygen in your blood. TREATMENT You will likely be treated with oxygen therapy. Depending on the cause of your hypoxemia, you may need oxygen for a short time (weeks or months), or you may need it indefinitely. Your health care provider may also recommend other therapies to treat the underlying cause of your hypoxemia. HOME CARE INSTRUCTIONS  Only take over-the-counter or prescription medicines as directed by your health care provider.  Follow oxygen safety measures if you are on oxygen therapy. These may include:  Always having a backup supply of oxygen.  Not allowing anyone to smoke around oxygen.  Handling the oxygen tanks carefully and as instructed.  If you smoke, quit. Stay away from people who smoke.  Follow up with your health care provider as directed. SEEK MEDICAL CARE IF:  You have any concerns about your oxygen therapy.  You still have trouble breathing.  You become short of breath when you exercise.  You are tired when you wake up.  You have a headache when you wake up. SEEK IMMEDIATE MEDICAL CARE IF:   Your breathing gets worse.  You have new shortness of breath with normal activity.  You have a bluish color of the skin, lips, or nail beds.  You have confusion or cloudy thinking.  You cough up dark mucus.  You have chest pain.  You have a fever. MAKE SURE YOU:  Understand these instructions.  Will watch your condition.  Will get help right away if you are not doing well or get  worse.   This information is not intended to replace advice given to you by your health care provider. Make sure you discuss any questions you have with your health care provider.   Document Released: 01/16/2011 Document Revised: 07/08/2013 Document Reviewed: 01/30/2013 Elsevier Interactive Patient Education [employer]

## 2015-06-17 NOTE — ED Notes (Signed)
Patient presents to the ED from group home with complaints of hypoxia and lethargy since earlier this morning.  Group home staff with patient states patient was lethargic, but staff also state patient was "screaming and hollerin'" this morning because she didn't want to use a shower chair.  Staff deny noticing SOB in patient.  Staff did not check patient's oxygen level, but noticed swelling under patient's right eye and took her to UC.  [facility] patient's O2 level was apparently found to be in the 80's and sent her to ED.  Patient also c/o chronic left knee pain.  Patient denies sensation of dyspnea, N/V/D, fever, cough, sore throat, ear ache and nasal congestion.  On exam, patients lung sounds are clear to auscultation with no wheezing or crackles.  Heart sounds S1/S2, no murmurs, rub or gallop. +2 radial and pedal pulses.  No pre-tibial or pedal edema.  Patient has some mild swelling under right eye that appears allergy related.

## 2015-06-17 NOTE — ED Notes (Signed)
Patient lives in a group home. Staff is here with the patient. Staff reports that the patient is suppose to be on oxygen at night, but did not have on when they arrived this AM. Staff reports that there is "no awake staff " on duty at night at the group home. Staff reports that the patient was very lethargic when they arrived this AM. Patient was placed on oxygen at the group home, but only registered 88% on O2 2L/min via [place]. Patient's Sats  In the ED 95% on O2 2L/min via [place].

## 2015-06-17 NOTE — ED Provider Notes (Signed)
CSN: [id number]     Arrival date & time 06/17/15  1154 History   First MD Initiated Contact with Patient 06/17/15 1221     Chief Complaint  Patient presents with  . oxygen issues   . lethargic      (Consider location/radiation/quality/duration/timing/severity/associated sxs/prior Treatment) HPI Patient was first taken to urgent care by her caregivers because of a small area of swelling beneath her right eye. They deny that they initiated the visit with any other immediate concerns. A while at the urgent care, oxygen saturation monitoring showed the patient had an oxygen saturation in the 80s. The patient does not endorse chest pain or shortness of breath and the caregivers have not noted the symptoms objectively. The patient was referred to the emergency department for further assessment. The patient caregivers are requesting prescription for a nighttime C Pap. They report that the patient frequently removes her oxygen during the night and they feel that having a mass, would be better. They report they discussed that with her provider who prescribes her oxygen and were told not to be concerned for that and the patient did not need a CPAP. Past Medical History  Diagnosis Date  . Hypertension   . Hyperlipemia   . Depressive disorder   . Osteoporosis   . Mild mental retardation    History reviewed. No pertinent past surgical history. Family History  Problem Relation Age of Onset  . Family history unknown: Yes   Social History  Substance Use Topics  . Smoking status: Never Smoker   . Smokeless tobacco: Never Used  . Alcohol Use: No   OB History    No data available     Review of Systems  10 Systems reviewed and are negative for acute change except as noted in the HPI.   Allergies  Review of patient's allergies indicates no known allergies.  Home Medications   Prior to Admission medications   Medication Sig Start Date End Date Taking? Authorizing Provider   amLODipine-benazepril (LOTREL) 5-20 MG per capsule Take 1 capsule by mouth daily. 05/17/13  Yes Historical Provider, MD  ammonium lactate (LAC-HYDRIN) 12 % lotion Apply 1 application topically 2 (two) times daily. Apply to face 05/19/13  Yes Historical Provider, MD  aspirin 81 MG chewable tablet Chew 81 mg by mouth daily.   Yes Historical Provider, MD  Calcium Carb-Cholecalciferol (CALCIUM 600 + D PO) Take 1 tablet by mouth 2 (two) times daily.   Yes Historical Provider, MD  gabapentin (NEURONTIN) 800 MG tablet Take 400-1,200 tablets by mouth 3 (three) times daily. Takes 400mg  at 0700 and 1600, then takes 1200mg  at bedtime 05/17/13  Yes Historical Provider, MD  Multiple Vitamin (MULTIVITAMIN WITH MINERALS) TABS tablet Take 1 tablet by mouth daily.   Yes Historical Provider, MD  omeprazole (PRILOSEC) 20 MG capsule Take 1 capsule by mouth daily with breakfast.  06/13/13  Yes Historical Provider, MD  oxybutynin (DITROPAN-XL) 10 MG 24 hr tablet Take 10 mg by mouth daily with breakfast.   Yes Historical Provider, MD  PARoxetine (PAXIL) 40 MG tablet Take 1 tablet by mouth daily. 06/13/13  Yes Historical Provider, MD  Polyethyl Glycol-Propyl Glycol (SYSTANE ULTRA) 0.4-0.3 % SOLN Place 1 drop into both eyes 2 (two) times daily.   Yes Historical Provider, MD  risperiDONE (RISPERDAL) 1 MG tablet Take 1-2 tablets by mouth 2 (two) times daily. Takes 1mg  in the morning and 2mg  at bedtime 05/17/13  Yes Historical Provider, MD  simvastatin (ZOCOR) 20 MG tablet Take  1 tablet by mouth daily with breakfast.  05/19/13  Yes Historical Provider, MD   BP 150/96 mmHg  Pulse 98  Temp(Src) 97.9 F (36.6 C) (Oral)  Resp 18  SpO2 94% Physical Exam  Constitutional: She appears well-developed and well-nourished.  Patient is no distress. She is alert and cheerful.  HENT:  Head: Normocephalic and atraumatic.  Right Ear: External ear normal.  Left Ear: External ear normal.  Nose: Nose normal.  Mouth/Throat: Oropharynx is  clear and moist.  Patient has a small, soft swelling beneath her right eye. There is no associated erythema this is very soft. There is no periorbital edema. This is consistent with a small focus of allergic edema. The remainder of the face is normal.  Eyes: EOM are normal. Pupils are equal, round, and reactive to light.  Neck: Neck supple.  Cardiovascular: Normal rate, regular rhythm, normal heart sounds and intact distal pulses.   Pulmonary/Chest: Effort normal and breath sounds normal.  Abdominal: Soft. Bowel sounds are normal. She exhibits no distension. There is no tenderness.  Musculoskeletal: Normal range of motion. She exhibits no edema.  Patient has some chronic, arthritic enlargement of the left knee. There is a small amount of effusion. No associated erythema. Calves are soft and nontender.  Neurological: She is alert. She has normal strength. Coordination normal. GCS eye subscore is 4. GCS verbal subscore is 5. GCS motor subscore is 6.  Skin: Skin is warm, dry and intact.  Psychiatric: She has a normal mood and affect.    ED Course  Procedures (including critical care time) Labs Review Labs Reviewed - No data to display  Imaging Review No results found. I have personally reviewed and evaluated these images and lab results as part of my medical decision-making.   EKG Interpretation None      MDM   Final diagnoses:  Localized soft tissue swelling  Low oxygen saturation   Currently I do not find indications the patient is having an acute hypoxic event. She is stable on her baseline home O2 and has not been noted to be dyspneic, febrile or reporting any chest pain. She has a mild localized urticarial area on the face which was the primary concern for seeking treatment today. The caregivers expressed concern that the patient removes her oxygen at night and requested C Pap prescription. Apparently they have requested this from her primary provider but it sounds like she did  not meet necessity for it. I have explained that I cannot prescribe for sleep apnea the emergency department and this will have to be further assessed on an outpatient basis. Patient is discharged in stable condition.    Arby Barrette, MD 06/17/15 [phone]

## 2015-07-16 ENCOUNTER — Other Ambulatory Visit: Payer: Self-pay | Admitting: Nurse Practitioner

## 2015-07-16 DIAGNOSIS — M810 Age-related osteoporosis without current pathological fracture: Secondary | ICD-10-CM

## 2015-08-17 ENCOUNTER — Ambulatory Visit
Admission: RE | Admit: 2015-08-17 | Discharge: 2015-08-17 | Disposition: A | Discharge status: Home / Self Care | Payer: Medicare Other | Source: Ambulatory Visit | Attending: Nurse Practitioner | Admitting: Nurse Practitioner

## 2015-08-17 DIAGNOSIS — M810 Age-related osteoporosis without current pathological fracture: Secondary | ICD-10-CM

## 2016-03-09 ENCOUNTER — Encounter: Payer: Self-pay | Admitting: Internal Medicine

## 2016-03-09 ENCOUNTER — Ambulatory Visit (INDEPENDENT_AMBULATORY_CARE_PROVIDER_SITE_OTHER): Payer: Medicare Other | Admitting: Internal Medicine

## 2016-03-09 VITALS — BP 122/82 | HR 77 | Wt 142.0 lb

## 2016-03-09 DIAGNOSIS — R0600 Dyspnea, unspecified: Secondary | ICD-10-CM | POA: Insufficient documentation

## 2016-03-09 DIAGNOSIS — R0902 Hypoxemia: Secondary | ICD-10-CM | POA: Diagnosis not present

## 2016-03-09 NOTE — Patient Instructions (Addendum)
ICD-9-CM ICD-10-CM   1. Hypoxemia 799.02 R09.02 Pulse oximetry, overnight  2. Dyspnea 786.09 R06.00 CT Chest High Resolution    Do ONO on room air Do HRCT chest supine and prone  followup  -return next weeks to see APP pr MD after above -  At followup test room air resting pulse ox

## 2016-03-09 NOTE — Progress Notes (Signed)
Subjective:    Patient ID: Amy Solis, female    DOB: [birth year], 65 y.o.   MRN: [id number]  PCP Amy InchBADGER,MICHAEL C, MD  HPI  OV 03/09/2016  Chief Amy RossettiComplaint  Patient presents with  . Pulmonary Consult    [facility] [place] lung scar[place]. Pt denies SOB, cough and CP/tightness.    65 year old lady. 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 She is always in a wheelchair because of knee issues. She lives in [place]. She is accompanied by a caretaker who knows her somewhat only. Her name is Amy Solis. We do not have old charts with her . As best as I can figure out whether she tells me that patient has been on oxygen for many years [facility] [place]. But because [place] [facility] [place] patient cannot go to that center anymore. Impression is wondering if patient can come off oxygen. Patient herself denies any cough or shortness of breath although it is shortness of breath that is mild.-Also states that intermittently when oxygen on some patient does not have any problems. No one knows what her pulse ox's. Is also chest x-ray was CT scan or any images. It is possible patient has dyspnea but she is mostly in the wheelchair and she can watch TV and understand basic stuff.    has a past medical history of Depressive disorder; Hyperlipemia; Hypertension; Mild mental retardation; and Osteoporosis.   reports that she has never smoked. She has never used smokeless tobacco.  Past Surgical History:  Procedure Laterality Date  . ABDOMINAL HYSTERECTOMY      No Known Allergies   There is no immunization history on file for this patient.  Family History  Problem Relation Age of Onset  . Breast cancer Mother      Current Outpatient Prescriptions:  .  amLODipine-benazepril (LOTREL) 5-20 MG per capsule, Take 1 capsule by mouth daily., Disp: , Rfl:  .  ammonium  lactate (AMLACTIN) 12 % cream, Apply topically as needed for dry skin., Disp: , Rfl:  .  aspirin 81 MG chewable tablet, Chew 81 mg by mouth daily., Disp: , Rfl:  .  atorvastatin (LIPITOR) 10 MG tablet, Take 10 mg by mouth daily., Disp: , Rfl:  .  gabapentin (NEURONTIN) 800 MG tablet, Take 400-1,200 tablets by mouth 3 (three) times daily. Takes 400mg  at 0700 and 1600, then takes 1200mg  at bedtime, Disp: , Rfl:  .  meloxicam (MOBIC) 15 MG tablet, Take 15 mg by mouth daily., Disp: , Rfl:  .  Multiple Vitamin (MULTIVITAMIN WITH MINERALS) TABS tablet, Take 1 tablet by mouth daily., Disp: , Rfl:  .  omeprazole (PRILOSEC) 20 MG capsule, Take 1 capsule by mouth daily with breakfast. , Disp: , Rfl:  .  oxybutynin (DITROPAN-XL) 10 MG 24 hr tablet, Take 10 mg by mouth daily with breakfast., Disp: , Rfl:  .  PARoxetine (PAXIL) 40 MG tablet, Take 1 tablet by mouth daily., Disp: , Rfl:  .  Polyethyl Glycol-Propyl Glycol (SYSTANE ULTRA) 0.4-0.3 % SOLN, Place 1 drop into both eyes 2 (two) times daily., Disp: , Rfl:  .  risperiDONE (RISPERDAL) 1 MG tablet, Take 1-2 tablets by mouth 2 (two) times daily. Takes 1mg  in the morning and 2mg  at bedtime, Disp: , Rfl:     Review of Systems  Constitutional: Negative for fever and unexpected weight change.  HENT: Negative for congestion, dental problem,  ear pain, nosebleeds, postnasal drip, rhinorrhea, sinus pressure, sneezing, sore throat and trouble swallowing.   Eyes: Negative for redness and itching.  Respiratory: Negative for cough, chest tightness, shortness of breath and wheezing.   Cardiovascular: Positive for leg swelling. Negative for palpitations.  Gastrointestinal: Negative for nausea and vomiting.  Genitourinary: Negative for dysuria.  Musculoskeletal: Negative for joint swelling.  Skin: Negative for rash.  Neurological: Negative for headaches.  Hematological: Does not bruise/bleed easily.  Psychiatric/Behavioral: Negative for dysphoric mood. The patient  is not nervous/anxious.        Objective:   Physical Exam Vitals:   03/09/16 1415  BP: 122/82  Pulse: 77  SpO2: 93%  Weight: 142 lb (64.4 kg)   There is no height or weight on file to calculate BMI.  92% on 2 L oxygen and 88% on room air at 10 minutes  Gen.: Overweight female seated on a wheelchair Central nervous system: Alert and oriented 3. Speech normal Psychiatry: Limited insight but able to understand and communicate basic stuff. Respiratory exam: No distress. Possible mild basal crackles Cardiovascular normal heart sounds regular rate and rhythm Abdomen soft nontender Extremities chronic venous stasis edema Skin: Intact      Assessment & Plan:     ICD-9-CM ICD-10-CM   1. Hypoxemia 799.02 R09.02 Pulse oximetry, overnight  2. Dyspnea 786.09 R06.00 CT Chest High Resolution    I have no idea base and the limited history walked her underlying problem is and why she is on oxygen but does well even when she is off oxygen. Best to reassess. I do not think she can do pulmonary function test because of her mild mental retardation. We will just get overnight oxygen desaturation on room air. We'll also get a high-resolution CT chest at at follow-up will get a room air resting pulse ox   Dr. Kalman ShanMurali Celese Solis, M.D., [facility]Solis.Solis.P Pulmonary and Critical Care Medicine Staff Physician [facility] Pager: [phone], If no answer or between  15:00h - 7:00h: call [phone]  03/09/2016 2:44 PM

## 2016-03-11 ENCOUNTER — Encounter: Payer: Self-pay | Admitting: Internal Medicine

## 2016-03-13 ENCOUNTER — Telehealth: Payer: Self-pay | Admitting: Internal Medicine

## 2016-03-13 DIAGNOSIS — R0902 Hypoxemia: Secondary | ICD-10-CM

## 2016-03-13 DIAGNOSIS — R0600 Dyspnea, unspecified: Secondary | ICD-10-CM

## 2016-03-13 NOTE — Telephone Encounter (Signed)
LMTC x 1  

## 2016-03-13 NOTE — Telephone Encounter (Signed)
[phone], pt care giver returned call

## 2016-03-13 NOTE — Telephone Encounter (Signed)
I called and spoke with pts caregiver and she stated that the [place] and may need to have this test done elsewhere.  Caregiver suggested that we call---[phone]---and I have lmom to call back about this.  Need to find out where we can schedule this at for the pt.

## 2016-03-15 ENCOUNTER — Inpatient Hospital Stay: Admission: RE | Admit: 2016-03-15 | Payer: Medicare Other | Source: Ambulatory Visit

## 2016-03-15 NOTE — Telephone Encounter (Signed)
Attempted to call the number provided by the pt's caregiver. There was no answer and I could not leave a message. Will try back later.

## 2016-03-16 NOTE — Telephone Encounter (Signed)
Amy Solis  If you call me and we go over the results; I can advise. Or wait till I get back to office rotation  Thanks  Dr. Kalman ShanMurali Doratha Mcswain, M.D., [facility]P Pulmonary and Critical Care Medicine Staff Physician [facility] Pager: [phone], If no answer or between  15:00h - 7:00h: call [phone]  03/16/2016 3:37 PM

## 2016-03-16 NOTE — Telephone Encounter (Signed)
Attempted to call the number given again. The line rang several times with no one coming to the line. Will try back.

## 2016-03-16 NOTE — Telephone Encounter (Signed)
Amy Solis called to get the results of the ONO for the pt.   MR please advise of these results.

## 2016-03-21 ENCOUNTER — Inpatient Hospital Stay: Admission: RE | Admit: 2016-03-21 | Payer: Medicare Other | Source: Ambulatory Visit

## 2016-03-22 NOTE — Telephone Encounter (Signed)
MR would you mind please putting the pt's results in this message. Robynn Panelise is not going to be in the office today. Thanks.

## 2016-03-23 ENCOUNTER — Ambulatory Visit (INDEPENDENT_AMBULATORY_CARE_PROVIDER_SITE_OTHER)
Admission: RE | Admit: 2016-03-23 | Discharge: 2016-03-23 | Disposition: A | Discharge status: Home / Self Care | Payer: Medicare Other | Source: Ambulatory Visit | Attending: Internal Medicine | Admitting: Internal Medicine

## 2016-03-23 DIAGNOSIS — R0600 Dyspnea, unspecified: Secondary | ICD-10-CM | POA: Diagnosis not present

## 2016-03-24 NOTE — Telephone Encounter (Signed)
Patient's nurse calling to get results from CT and ONO.  She said she can be reached at [phone] Home Number (call this number first), or Cell: [phone].    IMPRESSION: 1. No findings to suggest interstitial lung disease. 2. Marked elevation of the right hemidiaphragm which appears to be chronic based on comparison with prior chest radiographs. This is associated with some scarring and/or subsegmental atelectasis in the right lung base. 3. Aortic atherosclerosis, in addition to left main and 3 vessel coronary artery disease. Please note that although the presence of coronary artery calcium documents the presence of coronary artery disease, the severity of this disease and any potential stenosis cannot be assessed on this non-gated CT examination. Assessment for potential risk factor modification, dietary therapy or pharmacologic therapy may be warranted, if clinically indicated. 4. 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 Recommend annual imaging followup by CTA or MRA. This recommendation follows 2010 ACCF/AHA/AATS/ACR/ASA/SCA/SCAI/SIR/STS/SVM Guidelines for the Diagnosis and Management of Patients with Thoracic Aortic Disease. Circulation. 2010; 121: [id number]. 5. Mild air trapping, indicative of mild small airways disease.  Dr. Marchelle Gearingamaswamy, please advise.  Caregiver notified that MR is out of office until Monday, okay waiting until then for results.

## 2016-03-24 NOTE — Telephone Encounter (Signed)
Patient caregiver Amy Solis called and is wanting results of ONO and CT scan. She can be reached at [phone]. She would like a call by 10am because she will out of the home after that time. -pr

## 2016-03-27 ENCOUNTER — Telehealth: Payer: Self-pay | Admitting: Internal Medicine

## 2016-03-27 DIAGNOSIS — R0902 Hypoxemia: Secondary | ICD-10-CM

## 2016-03-27 DIAGNOSIS — R0600 Dyspnea, unspecified: Secondary | ICD-10-CM

## 2016-03-27 NOTE — Telephone Encounter (Signed)
Spoke with MR and he states that he has results and will review ASAP.   Spoke with pt's caregiver and advised that we will contact her once MR has reviewed results and made recommendations. She voiced understanding.

## 2016-03-27 NOTE — Telephone Encounter (Signed)
This message has already been given to MR for results. Caregiver was informed this morning that we are waiting on MR to review.   Routing to MR and Robynn Panelise for follow up.

## 2016-03-27 NOTE — Telephone Encounter (Signed)
Patient caregiver called back requesting results - advised that we are waiting on MR and will call as soon as we have results - pr

## 2016-03-27 NOTE — Telephone Encounter (Signed)
Patient caregiver Molly MaduroLatasha Green calling back for results. She can be reached at [phone]

## 2016-03-27 NOTE — Telephone Encounter (Signed)
Patient's nurse called Vivi BarrackStacy, [facility], and stated that she wants to discuss results from CT and ONO and will call every hour until someone calls her.  States she was very insistent someone call her ASAP.

## 2016-03-28 NOTE — Telephone Encounter (Signed)
Pt's nurse called back again today. She is very rude and disrespectful. She is demanding that MR review pt's results TODAY!  MR - please review these results. Thanks.

## 2016-03-28 NOTE — Telephone Encounter (Addendum)
Spoke with pt's caregiver. She is aware of CT results. She is upset that we do not have ONO report. [facility] is the one to performed the ONO. I called their office to get a copy of this and was told that [place], [place]. That office is non functioning right now. [facility] will attempted to send a copy of this report when they can.

## 2016-03-28 NOTE — Telephone Encounter (Signed)
natasha returning call.Caren GriffinsStanley A Dalton

## 2016-03-28 NOTE — Telephone Encounter (Signed)
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 Does not make sedendatry people short of breath. No Rx - there is coronary artery calcfication - refer cardiology  ONO  - not sure where it is?   Dr. Kalman ShanMurali Faiza Bansal, M.D., [facility] Pulmonary and Critical Care Medicine Staff Physician [place] [facility] [place] Pulmonary and Critical Care Pager: [phone], If no answer or between  15:00h - 7:00h: call [phone]  03/28/2016 9:03 AM

## 2016-03-28 NOTE — Telephone Encounter (Deleted)
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 Does not make sedendatry people short of breath. No Rx - there is coronary artery calcfication - refer cardiology  ONO  - not sure where it is?   Dr. Kalman ShanMurali Ramaswamy, M.D., [facility]P Pulmonary and Critical Care Medicine Staff Physician [place] [facility] [place] Pulmonary and Critical Care Pager: [phone], If no answer or between  15:00h - 7:00h: call [phone]  03/28/2016 9:17 AM

## 2016-03-28 NOTE — Telephone Encounter (Addendum)
ONO - RAM - < /-= 88% 2h 30min 29% of sleep - use 2L O2 at night. Also, hypoxemia unexplained  Plan  -r efer cards for co art calcification  - also order 2D echo    Dr. Kalman ShanMurali Majesty Stehlin, M.D., [facility]P Pulmonary and Critical Care Medicine Staff Physician [facility] [place] Pulmonary and Critical Care Pager: [phone], If no answer or between  15:00h - 7:00h: call [phone]  03/28/2016 9:09 AM

## 2016-03-28 NOTE — Telephone Encounter (Signed)
I have already spoken with [name] about the pt's CT results. She states that they will find the pt a cardiologist to see. I did not discuss with her about the 2D echo as this was not in MR's previous documentation. [name] is not aware of the ONO results as MR stated earlier that he could not find the report. I have left a message with [name] to return our call x1.

## 2016-03-28 NOTE — Telephone Encounter (Signed)
[facility] and gave [place] and recommendations. She requests signed order for O2 to be faxed to her [phone]. She is aware that they will receive call to schedule 2D echo. Orders placed. Nothing further needed.

## 2016-03-30 ENCOUNTER — Telehealth: Payer: Self-pay | Admitting: Internal Medicine

## 2016-03-30 NOTE — Telephone Encounter (Signed)
Called and spoke with Ebony. She is faxing over an O2 order on pt and needs this signed, then faxed back. Will await fax.

## 2016-03-30 NOTE — Progress Notes (Signed)
Called and spoke to caregiver. Appt made with MR on 05/16/16. Nothing further needed a this time.

## 2016-03-31 NOTE — Telephone Encounter (Signed)
This has been placed in MR's look-ats. Will update chart once form has been returned back to me.

## 2016-03-31 NOTE — Telephone Encounter (Signed)
Amy Solis has form for MR to sign. Please advise once done thanks

## 2016-04-03 ENCOUNTER — Telehealth: Payer: Self-pay | Admitting: Internal Medicine

## 2016-04-03 DIAGNOSIS — R0902 Hypoxemia: Secondary | ICD-10-CM

## 2016-04-03 DIAGNOSIS — R0600 Dyspnea, unspecified: Secondary | ICD-10-CM

## 2016-04-03 NOTE — Telephone Encounter (Signed)
[facility], needing an order with clearer instructions for [facility]2 [facility] pulse ox checks so there is no way to know if the patient's O2 is dropping below 88%. I explained that the order is written for the patient to only use daytime O2 for when her O2 levels are below 88% not for when she is "SOB" - Marylene Landngela stated again that they do not do O2 checks and that the order will have to be written for "daytime use PRN SOB" for the patient to be able to use during the daytime. Marylene Landngela states that if the [facility] were to walk in their facility and see that order written as we sent it, they would get a fine.  Kalman ShanMurali Ramaswamy, MD    2L o2 at night  + daytime prn for pulse ox goal > 88%     Please advise MR what you would like to do. Do you want us to try and get the patient a pulse ox of her own through the DME so her O2 can be managed properly as the nursing home is not doing this?

## 2016-04-03 NOTE — Telephone Encounter (Signed)
LMTCB for Amy Solis  

## 2016-04-03 NOTE — Telephone Encounter (Signed)
2L o2 at night  + daytime prn for pulse ox goal > 88%  Dr. Kalman ShanMurali Malissia Rabbani, M.D., [facility] Pulmonary and Critical Care Medicine Staff Physician [place] [facility] [place] Pulmonary and Critical Care Pager: [phone], If no answer or between  15:00h - 7:00h: call [phone]  04/03/2016 12:22 PM

## 2016-04-03 NOTE — Telephone Encounter (Signed)
Let us keep it simple - do o2 2L QHS and prn day time for dyspnea

## 2016-04-03 NOTE — Telephone Encounter (Signed)
Form signed by MR [facility] Called and left Ebony and VM letting her know the form has been faxed and to call back if she has not received it or if anything further is needed. Nothing further needed at this time. Will sign off.

## 2016-04-03 NOTE — Telephone Encounter (Signed)
k see if dme company can do this monitoring in day time

## 2016-04-03 NOTE — Telephone Encounter (Signed)
[facility] and they are unable to send someone out to monitor pt's O2 sats and they cannot provide pulse ox monitor as that is a retail item available OTC.   Would you be ok with [facility]'s [facility] sats 3-4 times daily and as needed when pt is ShOB? That may be the only way to get her O2 levels checked so that when pt is <88% she can be put on O2. Other than that they want O2 order revised to say "2L qhs and prn for ShOB".   MR - Please advise. Thanks!

## 2016-04-04 NOTE — Telephone Encounter (Signed)
[facility] spoke with Marylene Landngela - aware of order per MR Order placed, needs to be faxed to Marylene Landngela at [phone] [facility] to [facility] to ensure this gets faxed today.

## 2016-04-04 NOTE — Telephone Encounter (Signed)
Amy Solis returned call, CB is [phone]

## 2016-04-04 NOTE — Telephone Encounter (Signed)
I [facility]ed [facility] & confirmation was received.  Nothing further needed.

## 2016-04-10 ENCOUNTER — Telehealth: Payer: Self-pay | Admitting: Internal Medicine

## 2016-04-10 NOTE — Telephone Encounter (Signed)
Spoke with pt's caregiver, [name]. She had several questions about pt's oxygen orders. Her questions have been answered. Nothing further was needed at this time.

## 2016-04-11 ENCOUNTER — Other Ambulatory Visit (HOSPITAL_COMMUNITY): Payer: Medicare Other

## 2016-04-20 ENCOUNTER — Ambulatory Visit (HOSPITAL_COMMUNITY): Payer: Medicare Other | Attending: Cardiology

## 2016-04-20 ENCOUNTER — Other Ambulatory Visit: Payer: Self-pay

## 2016-04-20 DIAGNOSIS — I340 Nonrheumatic mitral (valve) insufficiency: Secondary | ICD-10-CM | POA: Insufficient documentation

## 2016-04-20 DIAGNOSIS — R0600 Dyspnea, unspecified: Secondary | ICD-10-CM | POA: Insufficient documentation

## 2016-04-20 DIAGNOSIS — E785 Hyperlipidemia, unspecified: Secondary | ICD-10-CM | POA: Diagnosis not present

## 2016-04-20 DIAGNOSIS — I071 Rheumatic tricuspid insufficiency: Secondary | ICD-10-CM | POA: Insufficient documentation

## 2016-04-20 DIAGNOSIS — I119 Hypertensive heart disease without heart failure: Secondary | ICD-10-CM | POA: Insufficient documentation

## 2016-04-21 ENCOUNTER — Telehealth: Payer: Self-pay | Admitting: Internal Medicine

## 2016-04-21 NOTE — Telephone Encounter (Signed)
lmtcb x1 

## 2016-04-21 NOTE — Telephone Encounter (Signed)
Let Amy RossettiSusan F Solis know that echo is normal  Dr. Kalman ShanMurali Aariya Ferrick, M.D., [facility] Pulmonary and Critical Care Medicine Staff Physician [place] [facility] [place] Pulmonary and Critical Care Pager: [phone], If no answer or between  15:00h - 7:00h: call [phone]  04/21/2016 10:49 AM

## 2016-04-24 NOTE — Telephone Encounter (Signed)
[name] returning call and can be reached @ [phone] .Caren GriffinsStanley A Dalton

## 2016-04-24 NOTE — Telephone Encounter (Signed)
Spoke with patient's care giver-[name] (DPR on file)-she is aware of echo results being normal but would like to know when patient should follow up with MD or NP.   MR Please advise.

## 2016-04-24 NOTE — Telephone Encounter (Signed)
Pt already has follow up with MR on 10/31 @ 9:30am.   LMTCB

## 2016-04-24 NOTE — Telephone Encounter (Signed)
[name] returned call - advised next follow up was routine and she was not satisfied with that she wants to know what specifically will be done at the visit and if MR is only to talk to her for a few minutes she will not bring her. She said to call her on her HOME PHONE only at [phone]- pr

## 2016-04-24 NOTE — Telephone Encounter (Signed)
Stacy called from [facility] - [name] called her again for results of CT - she would like us to tell her not to call her for results and to follow up with us - pr

## 2016-04-24 NOTE — Telephone Encounter (Signed)
Spoke with pt's caregiver and she states that appt on 10/31 was NOT made by her, so she cancelled it. She rescheduled to 10/24. I explained that MR would be reviewing test results and a physical exam when pt comes in. She was satisfied with this answer and will bring pt to appt. Nothing further needed.

## 2016-04-24 NOTE — Telephone Encounter (Signed)
LVM for [name] to return call.

## 2016-04-24 NOTE — Telephone Encounter (Signed)
[phone] [name] is calling back

## 2016-05-09 ENCOUNTER — Ambulatory Visit (INDEPENDENT_AMBULATORY_CARE_PROVIDER_SITE_OTHER): Payer: Medicare Other | Admitting: Internal Medicine

## 2016-05-09 VITALS — BP 124/80 | HR 83 | Wt 143.0 lb

## 2016-05-09 DIAGNOSIS — Z8249 Family history of ischemic heart disease and other diseases of the circulatory system: Secondary | ICD-10-CM

## 2016-05-09 DIAGNOSIS — I2584 Coronary atherosclerosis due to calcified coronary lesion: Secondary | ICD-10-CM

## 2016-05-09 DIAGNOSIS — R0600 Dyspnea, unspecified: Secondary | ICD-10-CM

## 2016-05-09 DIAGNOSIS — G4734 Idiopathic sleep related nonobstructive alveolar hypoventilation: Secondary | ICD-10-CM | POA: Diagnosis not present

## 2016-05-09 DIAGNOSIS — I2510 Atherosclerotic heart disease of native coronary artery without angina pectoris: Secondary | ICD-10-CM | POA: Diagnosis not present

## 2016-05-09 NOTE — Patient Instructions (Addendum)
ICD-9-CM ICD-10-CM   1. Dyspnea, unspecified type 786.09 R06.00   2. Nocturnal hypoxemia 327.24 G47.34   3. Coronary artery calcification seen on CAT scan 414.00 I25.10   4. Family history of early CAD V17.3 Z82.49     Shortness of breath probably due to weight and deconditioning and right diaphragm weakness No lung diseas noted Night o2 need could be related to undiagnosed sleep apnea  - wear 2L [place] at night  - hold off formal sleep apnea eval for time being till cards eval complete Refer cardiology for coronary artery calcification and family history of CAD   Followup 6 months or sooner

## 2016-05-09 NOTE — Progress Notes (Signed)
Subjective:     Patient ID: Amy Solis, female   DOB: 1951/01/18, 65 y.o.   MRN: [id number]  HPI   PCP Eartha InchBADGER,MICHAEL C, MD  HPI  OV 03/09/2016  Chief Complaint  Patient presents with  . Pulmonary Consult    [facility] [place] lung [place]. Pt denies SOB, cough and CP/tightness.    65 year old lady. 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 She is always in a wheelchair because of knee issues. [place] She is accompanied by a caretaker who knows her somewhat only. Her name is Amy Solis. We do not have old charts with her . As best as I can figure out whether she tells me that patient has been on oxygen for many years [facility] [place]. But because [place] [facility] patient cannot go to that center anymore. Impression is wondering if patient can come off oxygen. Patient herself denies any cough or shortness of breath although it is shortness of breath that is mild.-Also states that intermittently when oxygen on some patient does not have any problems. No one knows what her pulse ox's. Is also chest x-ray was CT scan or any images. It is possible patient has dyspnea but she is mostly in the wheelchair and she can watch TV and understand basic stuff.    has a past medical history of Depressive disorder; Hyperlipemia; Hypertension; Mild mental retardation; and Osteoporosis.   reports that she has never smoked. She has never used smokeless tobacco.   OV 05/09/2016  Chief Complaint  Patient presents with  . Follow-up    Pt's caregiver states she thinks her breathing seems to be doing well. Pt denies CP/tightness and f/c/s. Pt here after ONO and HRCT.    Follow-up oxygen evaluation with possible dyspnea    Patient presents again for follow-up of test results with regular [name].Patient smoked communicative today. Amy Solis tells me  that overnight oxygen is not been started despite positive results. Patient also says that both her father and mother died in the 8060s and brother died in his 4050s from coronary artery disease. CT scan of the chest in September 2017 did not show any issues in the lung parenchyma. 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 CT scan also shows coronary artery calcifications. Echocardiogram shows grade 1 diastolic dysfunction. Overnight saturations test was positive. We started her on oxygen therapy but this has not been implemented at the living facility. She tells me patient snores. She has not been formally evaluated for sleep apnea  Off note patient is still very sedentary. She hardly walks. Amy Solis tells me that at facility they plan to ambulate slowly with a walker. 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 Also, hypoxemia unexplained  Impressions: ECHO 04/20/16 - Normal LV systolic function; mild LVH; grade 1 diastolic   dysfunction; mild MR; mild LAE; trace TR; density noted in RA   likely represents prominent eustachian valve.         has a past medical history of Depressive disorder; Hyperlipemia; Hypertension; Mild mental retardation; and Osteoporosis.  reports that she has never smoked. She has never used smokeless tobacco.  Past Surgical History:  Procedure Laterality Date  . ABDOMINAL HYSTERECTOMY      No Known Allergies   There is no immunization history on file for this patient.  Family History  Problem Relation Age of Onset  . Breast cancer Mother      Current Outpatient Prescriptions:  .  amLODipine-benazepril (LOTREL) 5-20 MG per capsule, Take 1 capsule by mouth daily., Disp: , Rfl:  .  ammonium lactate (AMLACTIN) 12 %  cream, Apply topically as needed for dry skin., Disp: , Rfl:  .  aspirin 81 MG chewable tablet, Chew 81 mg by mouth daily., Disp: , Rfl:  .  atorvastatin (LIPITOR) 10 MG tablet, Take 10 mg by mouth daily., Disp: , Rfl:  .  gabapentin (NEURONTIN) 800 MG tablet, Take 400-1,200 tablets by mouth 3 (three) times daily. Takes 400mg  at 0700 and 1600, then takes 1200mg  at bedtime, Disp: , Rfl:  .  meloxicam (MOBIC) 15 MG tablet, Take 15 mg by mouth daily., Disp: , Rfl:  .  Multiple Vitamin (MULTIVITAMIN WITH MINERALS) TABS tablet, Take 1 tablet by mouth daily., Disp: , Rfl:  .  omeprazole (PRILOSEC) 20 MG capsule, Take 1 capsule by mouth daily with breakfast. , Disp: , Rfl:  .  oxybutynin (DITROPAN-XL) 10 MG 24 hr tablet, Take 10 mg by mouth daily with breakfast., Disp: , Rfl:  .  PARoxetine (PAXIL) 40 MG tablet, Take 1 tablet by mouth daily., Disp: , Rfl:  .  Polyethyl Glycol-Propyl Glycol (SYSTANE ULTRA) 0.4-0.3 % SOLN, Place 1 drop into both eyes 2 (two) times daily., Disp: , Rfl:  .  risperiDONE (RISPERDAL) 1 MG tablet, Take 1-2 tablets by mouth 2 (two) times daily. Takes 1mg  in the morning and 2mg  at bedtime, Disp: , Rfl:     Review of Systems     Objective:   Physical Exam Vitals:   05/09/16 1020  BP: 124/80  Pulse: 83  SpO2: 92%  Weight: 143 lb (64.9 kg)    Brief exam shows obese female sitting in a wheelchair. She will talk slowly. She is able to answer some simple questions. Cardiac exam shows a murmur. Respiratory exam is clear. She is obese. There is no pedal edema    Assessment:       ICD-9-CM ICD-10-CM   1. Dyspnea, unspecified type 786.09 R06.00   2. Nocturnal hypoxemia 327.24 G47.34   3. Coronary artery calcification seen on CAT scan 414.00 I25.10 Ambulatory referral to Cardiology  4. Family history of early CAD V17.3 Z82.49 Ambulatory referral to Cardiology       Plan:       Shortness of breath probably due to weight and deconditioning and right diaphragm  weakness No lung diseas noted Night o2 need could be related to undiagnosed sleep apnea  - wear 2L [place] at night  - hold off formal sleep apnea eval for time being till cards eval complete Refer cardiology for coronary artery calcification and family history of CAD   Followup 6 months or sooner   (> 50% of this 15 min visit spent in face to face counseling or/and coordination of care)  Dr. [name], M.D., [facility] Pulmonary and Critical Care Medicine Staff Physician [place] [facility] [place] Pulmonary and Critical Care Pager: [phone], If no answer or between  15:00h - 7:00h: call [phone]  05/09/2016 10:44 AM

## 2016-05-16 ENCOUNTER — Ambulatory Visit: Payer: Medicare Other | Admitting: Internal Medicine

## 2016-06-06 ENCOUNTER — Ambulatory Visit (INDEPENDENT_AMBULATORY_CARE_PROVIDER_SITE_OTHER): Payer: Medicare Other | Admitting: Cardiovascular Disease

## 2016-06-06 ENCOUNTER — Encounter: Payer: Self-pay | Admitting: Cardiovascular Disease

## 2016-06-06 VITALS — BP 130/70 | HR 82 | Ht 64.0 in | Wt 145.8 lb

## 2016-06-06 DIAGNOSIS — I5189 Other ill-defined heart diseases: Secondary | ICD-10-CM | POA: Insufficient documentation

## 2016-06-06 DIAGNOSIS — I2510 Atherosclerotic heart disease of native coronary artery without angina pectoris: Secondary | ICD-10-CM

## 2016-06-06 DIAGNOSIS — I519 Heart disease, unspecified: Secondary | ICD-10-CM | POA: Diagnosis not present

## 2016-06-06 DIAGNOSIS — I2584 Coronary atherosclerosis due to calcified coronary lesion: Secondary | ICD-10-CM | POA: Diagnosis not present

## 2016-06-06 NOTE — Progress Notes (Signed)
Cardiology Office Note   Date:  06/06/2016   ID:  Amy Solis, DOB 1951-03-01, MRN [id number]  PCP:  Eartha Inch, MD  Cardiologist:   Kristeen Miss, MD   Chief Complaint  Patient presents with  . Coronary Artery Calcification   Problem list 1. Mental retardation 2. Hyperlipidemia 3. Essential hypertension   History of Present Illness: Amy Solis is a 65 y.o. female who presents for further evaluations of coronary calcifications over incidentally noted on CT scan.  Amy Solis has been on home oxygen for quite some time. She is a resident at [facility]. She was recently seen by [name], M.D who ordered a CT scan of her lung. Just noted have coronary calcifications in the LM and all 3 coronary arteries.   Pt has been at a group home for years and now lives at  [facility] .   Was seen with her private care giver, [name].   [name] is able to speak but almost all of the history is from her caregiver   She is wheel chair bound Has knee pain .  Her private caregiver says that she is supposed to have a procedure in her knee which may allow her to walk with a walker.     Eats an unrestricted diet She eats a fair bit of salt in the diet   Primary MD is [name], MD   Denies Any chest pain or shortness breath. She denies any syncope or presyncope.   Past Medical History:  Diagnosis Date  . Depressive disorder   . Hyperlipemia   . Hypertension   . Mild mental retardation   . Osteoporosis     Past Surgical History:  Procedure Laterality Date  . ABDOMINAL HYSTERECTOMY       Current Outpatient Prescriptions  Medication Sig Dispense Refill  . amLODipine-benazepril (LOTREL) 5-20 MG per capsule Take 1 capsule by mouth daily.    Marland Kitchen ammonium lactate (AMLACTIN) 12 % cream Apply topically as needed for dry skin.    Marland Kitchen aspirin 81 MG chewable tablet Chew 81 mg by mouth daily.    Marland Kitchen atorvastatin (LIPITOR) 10 MG tablet Take 10 mg by mouth daily.      Marland Kitchen gabapentin (NEURONTIN) 800 MG tablet Take 400-1,200 tablets by mouth 3 (three) times daily. Takes 400mg  at 0700 and 1600, then takes 1200mg  at bedtime    . meloxicam (MOBIC) 15 MG tablet Take 15 mg by mouth daily.    . Multiple Vitamin (MULTIVITAMIN WITH MINERALS) TABS tablet Take 1 tablet by mouth daily.    Marland Kitchen omeprazole (PRILOSEC) 20 MG capsule Take 1 capsule by mouth daily with breakfast.     . oxybutynin (DITROPAN-XL) 10 MG 24 hr tablet Take 10 mg by mouth daily with breakfast.    . PARoxetine (PAXIL) 40 MG tablet Take 1 tablet by mouth daily.    Bertram Gala Glycol-Propyl Glycol (SYSTANE ULTRA) 0.4-0.3 % SOLN Place 1 drop into both eyes 2 (two) times daily.    . risperiDONE (RISPERDAL) 1 MG tablet Take 1-2 tablets by mouth 2 (two) times daily. Takes 1mg  in the morning and 2mg  at bedtime     No current facility-administered medications for this visit.     Allergies:   Patient has no known allergies.    Social History:  The patient  reports that she has never smoked. She has never used smokeless tobacco. She reports that she does not drink alcohol or use drugs.   Family History:  The patient's family history includes Breast cancer in her mother; Heart attack in her brother and father.    ROS:  Please see the history of present illness.    Review of Systems: Constitutional:  denies fever, chills, diaphoresis, appetite change and fatigue.  HEENT: denies photophobia, eye pain, redness, hearing loss, ear pain, congestion, sore throat, rhinorrhea, sneezing, neck pain, neck stiffness and tinnitus.  Respiratory: denies SOB, DOE, cough, chest tightness, and wheezing.  Cardiovascular: denies chest pain, palpitations and leg swelling.  Gastrointestinal: denies nausea, vomiting, abdominal pain, diarrhea, constipation, blood in stool.  Genitourinary: denies dysuria, urgency, frequency, hematuria, flank pain and difficulty urinating.  Musculoskeletal: denies  myalgias, back pain, joint  swelling, arthralgias and gait problem.   Skin: denies pallor, rash and wound.  Neurological: denies dizziness, seizures, syncope, weakness, light-headedness, numbness and headaches.   Hematological: denies adenopathy, easy bruising, personal or family bleeding history.  Psychiatric/ Behavioral: denies suicidal ideation, mood changes, confusion, nervousness, sleep disturbance and agitation.       All other systems are reviewed and negative.    PHYSICAL EXAM: VS:  BP 130/70   Pulse 82   Ht 5\' 4"  (1.626 m)   Wt 145 lb 12.8 oz (66.1 kg)   BMI 25.03 kg/m  , BMI Body mass index is 25.03 kg/m. GEN: middle age female,  Examined in wheelchair.   Partially slumped over in the chair HEENT: normal  Neck: no JVD, carotid bruits, or masses Cardiac: RRR; soft systolic murmur, no rubs, or gallops,no edema  Respiratory:  clear to auscultation bilaterally, normal work of breathing GI: soft, nontender, nondistended, + BS MS: no deformity or atrophy  Skin: warm and dry, no rash Neuro:  Strength and sensation are intact Psych: normal   EKG:  EKG is ordered today. The ekg ordered today demonstrates  NSR at 82.   No ST or T wave changes.    Recent Labs: No results found for requested labs within last 8760 hours.    Lipid Panel No results found for: CHOL, TRIG, HDL, CHOLHDL, VLDL, LDLCALC, LDLDIRECT    Wt Readings from Last 3 Encounters:  06/06/16 145 lb 12.8 oz (66.1 kg)  05/09/16 143 lb (64.9 kg)  03/09/16 142 lb (64.4 kg)      Other studies Reviewed: Additional studies/ records that were reviewed today include: . Review of the above records demonstrates:    ASSESSMENT AND PLAN:  1.  Cornea artery calcifications: Amy PikesSusan is referred for further evaluation of coronary artery calcifications. She is completely asymptomatic. She's mentally retarded and really is not able to provide much history. She has normal left systolic function by echo.  At this point I do not recommend any  further workup. I do not think that we should consider any invasive or interventional studies in this patient who has no symptoms.  She's on atorvastatin and I presume that she has some hyperlipidemia. We should consider the fairly aggressive with her atorvastatin therapy.  2. Chronic diastolic dysfunction: She has had an echo card gram which reveals normal left ventricular systolic function. She does have mild diastolic dysfunction which I suspect is due to her history of hypertension and her age. Her blood pressure is currently normal. I do not have any further recommendations.  3. Hypoxemia: Her O2 saturations were initially fairly low-86-87% on room air. I noticed that she was slumped over in the chair. I had her sit up and take several deep breaths and her O2 saturations promptly increased to 96 -  97%.  I think her hypoxemia is due to positional hypoventilation/obesity hypoventilation.  She has normal left ventricle systolic function and her hypoxemia is not due to a cardiac etiology.  3. Hyperlipidemia: This is managed by Dr.  Cyndia BentBadger.    Current medicines are reviewed at length with the patient today.  The patient does not have concerns regarding medicines.  Labs/ tests ordered today include:  No orders of the defined types were placed in this encounter.    Disposition:   FU with me as needed.      Kristeen MissPhilip Charvi Gammage, MD  06/06/2016 10:45 AM    [facility] [address], [place], [place]  [id number] Phone: [phone]; Fax: [phone]

## 2016-06-06 NOTE — Patient Instructions (Signed)

## 2016-08-14 ENCOUNTER — Other Ambulatory Visit: Payer: Self-pay | Admitting: Nurse Practitioner

## 2016-08-14 DIAGNOSIS — Z1231 Encounter for screening mammogram for malignant neoplasm of breast: Secondary | ICD-10-CM

## 2016-09-12 ENCOUNTER — Ambulatory Visit
Admission: RE | Admit: 2016-09-12 | Discharge: 2016-09-12 | Disposition: A | Discharge status: Home / Self Care | Payer: Medicare Other | Source: Ambulatory Visit | Attending: Nurse Practitioner | Admitting: Nurse Practitioner

## 2016-09-12 DIAGNOSIS — Z1231 Encounter for screening mammogram for malignant neoplasm of breast: Secondary | ICD-10-CM

## 2016-12-28 ENCOUNTER — Ambulatory Visit (INDEPENDENT_AMBULATORY_CARE_PROVIDER_SITE_OTHER): Payer: Medicare Other | Admitting: Acute Care

## 2016-12-28 ENCOUNTER — Encounter: Payer: Self-pay | Admitting: Acute Care

## 2016-12-28 DIAGNOSIS — G4734 Idiopathic sleep related nonobstructive alveolar hypoventilation: Secondary | ICD-10-CM | POA: Diagnosis not present

## 2016-12-28 DIAGNOSIS — R0902 Hypoxemia: Secondary | ICD-10-CM

## 2016-12-28 NOTE — Patient Instructions (Addendum)
It is nice to meet you today. [facility] at 2 L [place] every night without fail. Oxygen during the day as needed. Please maintain oxygen saturation > 92% with exercise. Remember to sit up straight for better distribution of oxygen to help with your oxygen saturations Follow up with Dr. Marchelle Gearingamaswamy in 6 months Please contact office for sooner follow up if symptoms do not improve or worsen or seek emergency care

## 2016-12-28 NOTE — Progress Notes (Signed)
History of Present Illness Amy Solis is a 66 y.o. female never smoker with dyspnea. She is followed by Dr. Marchelle Solis  66 year old lady. 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 She is always in a wheelchair because of knee issues. She lives in [place]. She is on oxygen 2L at night and she wears it prn throughout the day.  12/28/2016 6 month follow up: Pt. Presents for 6 month follow up. She lives at [facility].Pt. States she is doing well at [facility]. She states that she has not had any cough or breathing issues. She states that [facility] is not placing her on her oxygen at night. Additionally she is not being placed on oxygen during the day prn. She is here with her caregiver today. Saturation was 85% on RA today in the office. She does immediately rebound to greater than 92% on oxygen. She denies fever, chest pain, orthopnea or hemoptysis.  Test Results:  CBC Latest Ref Rng & Units 06/22/2013 06/16/2013  WBC 4.0 - 10.5 K/uL 4.5 3.7(L)  Hemoglobin 12.0 - 15.0 g/dL 15.9(H) 13.9  Hematocrit 36.0 - 46.0 % 46.0 41.2  Platelets 150 - 400 K/uL 234 264    BMP Latest Ref Rng & Units 06/22/2013 06/16/2013  Glucose 70 - 99 mg/dL 94 161(W)  BUN 6 - 23 mg/dL 15 17  Creatinine 9.60 - 1.10 mg/dL 4.54 0.98  Sodium 119 - 145 mEq/L 134(L) 139  Potassium 3.5 - 5.1 mEq/L 5.1 4.1  Chloride 96 - 112 mEq/L 103 105  CO2 19 - 32 mEq/L 18(L) 24  Calcium 8.4 - 10.5 mg/dL 9.5 9.7      Past medical hx Past Medical History:  Diagnosis Date  . Depressive disorder   . Hyperlipemia   . Hypertension   . Mild mental retardation   . Osteoporosis      Social History  Substance Use Topics  . Smoking status: Never Smoker  . Smokeless tobacco: Never Used  . Alcohol use No    Tobacco Cessation: Never smoker  Past surgical hx, Family hx, Social hx all reviewed.  Current Outpatient Prescriptions on File Prior to Visit  Medication Sig  .  amLODipine-benazepril (LOTREL) 5-20 MG per capsule Take 1 capsule by mouth daily.  Marland Kitchen ammonium lactate (AMLACTIN) 12 % cream Apply topically as needed for dry skin.  Marland Kitchen aspirin 81 MG chewable tablet Chew 81 mg by mouth daily.  Marland Kitchen atorvastatin (LIPITOR) 10 MG tablet Take 10 mg by mouth daily.  Marland Kitchen gabapentin (NEURONTIN) 800 MG tablet Take 400-1,200 tablets by mouth 3 (three) times daily. Takes 400mg  at 0700 and 1600, then takes 1200mg  at bedtime  . meloxicam (MOBIC) 15 MG tablet Take 15 mg by mouth daily.  . Multiple Vitamin (MULTIVITAMIN WITH MINERALS) TABS tablet Take 1 tablet by mouth daily.  Marland Kitchen omeprazole (PRILOSEC) 20 MG capsule Take 1 capsule by mouth daily with breakfast.   . oxybutynin (DITROPAN-XL) 10 MG 24 hr tablet Take 10 mg by mouth daily with breakfast.  . PARoxetine (PAXIL) 40 MG tablet Take 1 tablet by mouth daily.  Bertram Gala Glycol-Propyl Glycol (SYSTANE ULTRA) 0.4-0.3 % SOLN Place 1 drop into both eyes 2 (two) times daily.  . risperiDONE (RISPERDAL) 1 MG tablet Take 1-2 tablets by mouth 2 (two) times daily. Takes 1mg  in the morning and 2mg  at bedtime   No current facility-administered medications on file prior to visit.      No Known Allergies  Review Of Systems:  Constitutional:   No  weight loss, night sweats,  Fevers, chills, fatigue, or  lassitude.  HEENT:   No headaches,  Difficulty swallowing,  Tooth/dental problems, or  Sore throat,                No sneezing, itching, ear ache, nasal congestion, post nasal drip,   CV:  No chest pain,  Orthopnea, PND, swelling in lower extremities, anasarca, dizziness, palpitations, syncope.   GI  No heartburn, indigestion, abdominal pain, nausea, vomiting, diarrhea, change in bowel habits, loss of appetite, bloody stools.   Resp: No shortness of breath with exertion or at rest.  No excess mucus, no productive cough,  No non-productive cough,  No coughing up of blood.  No change in color of mucus.  No wheezing.  No chest wall  deformity  Skin: no rash or lesions.  GU: no dysuria, change in color of urine, no urgency or frequency.  No flank pain, no hematuria   MS:  No joint pain or swelling.  No decreased range of motion.  No back pain.  Psych:  No change in mood or affect. No depression or anxiety.  No memory loss.   Vital Signs BP 120/80 (BP Location: Right Arm, Cuff Size: Normal)   Pulse 87   Wt 149 lb 11.2 oz (67.9 kg)   SpO2 91%   BMI 25.70 kg/m    Physical Exam:  General- No distress,  A&Ox3, pleasant ENT: No sinus tenderness, TM clear, pale nasal mucosa, no oral exudate,no post nasal drip, no LAN Cardiac: S1, S2, regular rate and rhythm, no murmur Chest: No wheeze/ rales/ dullness; no accessory muscle use, no nasal flaring, no sternal retractions Abd.: Soft Non-tender Ext: No clubbing cyanosis, 1+ bilateral lower extremity edema Neuro:  Deconditioned, wheelchair-bound, moving all extremities 4, alert and oriented to self place and time, follows commands appropriately, answers questions appropriately Skin: No rashes, warm and dry Psych: normal mood and behavior, mild mental retardation but cognizant and converses    Assessment/Plan  Hypoxemia Saturation was 85% today on room air. Immediately rebounds on 2 L nasal cannula Per caregiver and patient she is not being placed on oxygen as needed at the nursing home Saturations to improve when patient repositions her self and sits up straight in her wheelchair. Suspect some of her hypoxia is secondary to positional hypoventilation/obesity hypoventilation Plan [facility] at 2 L [place] every night without fail. Oxygen during the day as needed. Please maintain oxygen saturation > 92% with exercise, and at rest. Encourage patient to sit up straight for better distribution of oxygen to help improve oxygen saturations Follow up with Dr. Marchelle Gearingamaswamy in 6 months Please contact office for sooner follow up if  symptoms do not improve or worsen or seek emergency care    Nocturnal hypoxemia Patient is currently not being placed [facility] Plan Order sent for patient to be placed on oxygen 2 L nasal cannula every night as was previously ordered. Saturation goal is greater than 92%    Bevelyn NgoSarah F Nasiah Lehenbauer, NP 12/28/2016  2:19 PM

## 2016-12-28 NOTE — Assessment & Plan Note (Signed)
Saturation was 85% today on room air. Immediately rebounds on 2 L nasal cannula Per caregiver and patient she is not being placed on oxygen as needed at the nursing home Saturations to improve when patient repositions her self and sits up straight in her wheelchair. Suspect some of her hypoxia is secondary to positional hypoventilation/obesity hypoventilation Plan [facility] at 2 L [place] every night without fail. Oxygen during the day as needed. Please maintain oxygen saturation > 92% with exercise, and at rest. Encourage patient to sit up straight for better distribution of oxygen to help improve oxygen saturations Follow up with Dr. Marchelle Gearingamaswamy in 6 months Please contact office for sooner follow up if symptoms do not improve or worsen or seek emergency care

## 2016-12-28 NOTE — Assessment & Plan Note (Addendum)
Patient is currently not being [facility] Plan Order sent for patient to be placed on oxygen 2 L nasal cannula every night as was previously ordered. Saturation goal is greater than 92%

## 2017-05-22 ENCOUNTER — Ambulatory Visit (INDEPENDENT_AMBULATORY_CARE_PROVIDER_SITE_OTHER)
Admission: RE | Admit: 2017-05-22 | Discharge: 2017-05-22 | Disposition: A | Discharge status: Home / Self Care | Payer: Medicare Other | Source: Ambulatory Visit | Attending: Internal Medicine | Admitting: Internal Medicine

## 2017-05-22 ENCOUNTER — Other Ambulatory Visit (INDEPENDENT_AMBULATORY_CARE_PROVIDER_SITE_OTHER): Payer: Medicare Other

## 2017-05-22 ENCOUNTER — Encounter: Payer: Self-pay | Admitting: Internal Medicine

## 2017-05-22 ENCOUNTER — Ambulatory Visit (INDEPENDENT_AMBULATORY_CARE_PROVIDER_SITE_OTHER): Payer: Medicare Other | Admitting: Internal Medicine

## 2017-05-22 VITALS — BP 110/70 | HR 89 | Ht 60.0 in | Wt 144.0 lb

## 2017-05-22 DIAGNOSIS — I2510 Atherosclerotic heart disease of native coronary artery without angina pectoris: Secondary | ICD-10-CM | POA: Diagnosis not present

## 2017-05-22 DIAGNOSIS — G4734 Idiopathic sleep related nonobstructive alveolar hypoventilation: Secondary | ICD-10-CM | POA: Diagnosis not present

## 2017-05-22 DIAGNOSIS — Z8249 Family history of ischemic heart disease and other diseases of the circulatory system: Secondary | ICD-10-CM

## 2017-05-22 DIAGNOSIS — R0600 Dyspnea, unspecified: Secondary | ICD-10-CM

## 2017-05-22 DIAGNOSIS — R0902 Hypoxemia: Secondary | ICD-10-CM

## 2017-05-22 DIAGNOSIS — I2584 Coronary atherosclerosis due to calcified coronary lesion: Secondary | ICD-10-CM | POA: Diagnosis not present

## 2017-05-22 LAB — CBC WITH DIFFERENTIAL/PLATELET
Basophils Absolute: 0 10*3/uL (ref 0.0–0.1)
Basophils Relative: 1.1 % (ref 0.0–3.0)
EOS ABS: 0.1 10*3/uL (ref 0.0–0.7)
Eosinophils Relative: 2 % (ref 0.0–5.0)
HCT: 44.4 % (ref 36.0–46.0)
HEMOGLOBIN: 14.8 g/dL (ref 12.0–15.0)
LYMPHS ABS: 1.2 10*3/uL (ref 0.7–4.0)
Lymphocytes Relative: 27.8 % (ref 12.0–46.0)
MCHC: 33.3 g/dL (ref 30.0–36.0)
MCV: 96.9 fl (ref 78.0–100.0)
MONO ABS: 0.6 10*3/uL (ref 0.1–1.0)
Monocytes Relative: 15 % — ABNORMAL HIGH (ref 3.0–12.0)
NEUTROS PCT: 54.1 % (ref 43.0–77.0)
Neutro Abs: 2.3 10*3/uL (ref 1.4–7.7)
Platelets: 305 10*3/uL (ref 150.0–400.0)
RBC: 4.58 Mil/uL (ref 3.87–5.11)
RDW: 13.8 % (ref 11.5–15.5)
WBC: 4.2 10*3/uL (ref 4.0–10.5)

## 2017-05-22 LAB — BASIC METABOLIC PANEL
BUN: 18 mg/dL (ref 6–23)
CALCIUM: 9.1 mg/dL (ref 8.4–10.5)
CO2: 27 mEq/L (ref 19–32)
CREATININE: 0.83 mg/dL (ref 0.40–1.20)
Chloride: 107 mEq/L (ref 96–112)
GFR: 72.64 mL/min (ref >60.00)
GLUCOSE: 88 mg/dL (ref 70–99)
Potassium: 3.7 mEq/L (ref 3.5–5.1)
Sodium: 142 mEq/L (ref 135–145)

## 2017-05-22 LAB — TROPONIN I: TNIDX: 0 ug/l (ref 0.00–0.06)

## 2017-05-22 NOTE — Patient Instructions (Addendum)
Nocturnal hypoxemia Hypoxemia  - oxygen appears low even in day time - continue night o2  -start day time o2 2L [place] at rest - check cbc, bmet, troponin, d-dimer; will call with results - check CXR 2 view 05/22/2017     Coronary artery calcification Family history of early CAD - per cards nov 2017  Followup 3 months with APP

## 2017-05-22 NOTE — Progress Notes (Addendum)
Subjective:     Patient ID: Amy Solis, female   DOB: 1951/06/26, 66 y.o.   MRN: [id number]  HPI  PCP Amy Inch, MD  HPI  OV 03/09/2016  Chief Complaint  Patient presents with  . Pulmonary Consult    [facility] [place] for lung scarring. Pt denies SOB, cough and CP/tightness.    66 year old lady. 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 She is always in a wheelchair because of knee issues. She lives in [place]. She is accompanied by a caretaker who knows her somewhat only. Her name is Amy Solis. We do not have old charts with her . As best as I can figure out whether she tells me that patient has been on oxygen for many years through [facility] in [place]. But because patient lives in [facility] patient cannot go to that center anymore. Impression is wondering if patient can come off oxygen. Patient herself denies any cough or shortness of breath although it is shortness of breath that is mild.-Also states that intermittently when oxygen on some patient does not have any problems. No one knows what her pulse ox's. Is also chest x-ray was CT scan or any images. It is possible patient has dyspnea but she is mostly in the wheelchair and she can watch TV and understand basic stuff.    has a past medical history of Depressive disorder; Hyperlipemia; Hypertension; Mild mental retardation; and Osteoporosis.   reports that she has never smoked. She has never used smokeless tobacco.   OV 05/09/2016  Chief Complaint  Patient presents with  . Follow-up    Pt's caregiver states she thinks her breathing seems to be doing well. Pt denies CP/tightness and f/c/s. Pt here after ONO and HRCT.    Follow-up oxygen evaluation with possible dyspnea    Patient presents again for follow-up of test results with regular Amy Solis.Patient smoked communicative today. Amy Solis tells me that  overnight oxygen is not been started despite positive results. Patient also says that both her father and mother died in the 59s and brother died in his 61s from coronary artery disease. CT scan of the chest in September 2017 did not show any issues in the lung parenchyma. 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 CT scan also shows coronary artery calcifications. Echocardiogram shows grade 1 diastolic dysfunction. Overnight saturations test was positive. We started her on oxygen therapy but this has not been implemented at the living facility. She tells me patient snores. She has not been formally evaluated for sleep apnea  Off note patient is still very sedentary. She hardly walks. Amy Solis tells me that at facility they plan to ambulate slowly with a walker. At rest there is no dyspnea    CT 03/23/16  - no emphsyema, no lung cancer, no nodules, no ILD - good news  - right diaprhagm is paralyzed and we do not know cause - there is coronary artery calcfication -   ONO - RAM - < /-= 88% 2h 29% of sleep - use 2L O2 at night. Also, hypoxemia unexplained  Impressions: ECHO 04/20/16 - Normal LV systolic function; mild LVH; grade 1 diastolic   dysfunction; mild MR; mild LAE; trace TR; density noted in RA   likely represents prominent eustachian valve.         12/28/2016 6 month follow up: Pt. Presents for 6 month follow up. She lives at [facility]Pt.  States she is [facility]. She states that she has not had any cough or breathing issues. She states that [facility] not [facility] her on her oxygen at night. Additionally she is not being placed on oxygen during the day prn. She is here with her caregiver today. Saturation was 85% on RA today in the office. She does immediately rebound to greater than 92% on oxygen. She denies fever, chest pain, orthopnea or hemoptysis.   OV 05/22/2017  Chief Complaint  Patient presents with  . Follow-up     breathing getting worse, sleeping alot, bending over in the chair which makes it more difficult to breath,      66 year old female. 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 She is always in a wheelchair because of knee issues. [facility]. She is on oxygen 2L at night and she wears it prn throughout the day. Unexplained hypoxemia    66 year old female with mild mental retardation and obesity has unexplained hypoxemia at night when liters of oxygen. She is here with a caretaker Amy Smilingatasha who tells me that for the last month or so the reports in the facility the patient has been more fatigued in the daytime, adopting a more hunched posture and everything is all related to lung disease. Today resting she did have hypoxemia which I believe is new compared to before.. Patient is only on nighttime oxygen. Amy Smilingatasha is not able to give me any more history other than the above. Amy Smilingatasha does not know if there is worsening pedal edema or hemoptysis. Only thing that she could tell me was that patient has an altered sleep cycle. Review of the chart shows she has coronary artery calcification and a family history of coronary artery disease- saw cards Nov 2017   has a past medical history of Depressive disorder, Hyperlipemia, Hypertension, Mild mental retardation, and Osteoporosis.   reports that  has never smoked. she has never used smokeless tobacco.  Past Surgical History:  Procedure Laterality Date  . ABDOMINAL HYSTERECTOMY      No Known Allergies  Immunization History  Administered Date(s) Administered  . Influenza, High Dose Seasonal PF 06/01/2014, 06/01/2014, 07/07/2015, 04/21/2017  . Influenza,trivalent, recombinat, inj, PF 05/01/2011, 04/16/2014  . Influenza-Unspecified 05/13/2012, 04/15/2013  . PPD Test 09/27/2015  . Pneumococcal Conjugate-13 06/01/2014  . Pneumococcal Polysaccharide-23 07/13/2009, 07/07/2015  . Tdap 07/13/2009  . Zoster  01/22/2010    Family History  Problem Relation Age of Onset  . Breast cancer Mother   . Heart attack Father   . Heart attack Brother      Current Outpatient Medications:  .  amLODipine-benazepril (LOTREL) 5-20 MG capsule, Take 1 capsule daily by mouth., Disp: , Rfl:  .  amLODipine-benazepril (LOTREL) 5-20 MG per capsule, Take 1 capsule by mouth daily., Disp: , Rfl:  .  ammonium lactate (AMLACTIN) 12 % cream, Apply topically as needed for dry skin., Disp: , Rfl:  .  ammonium lactate (LAC-HYDRIN) 12 % lotion, Apply 1 application as needed topically., Disp: , Rfl:  .  aspirin 81 MG chewable tablet, Chew 81 mg by mouth daily., Disp: , Rfl:  .  aspirin 81 MG tablet, Take 1 tablet daily by mouth., Disp: , Rfl:  .  atorvastatin (LIPITOR) 10 MG tablet, Take 10 mg by mouth daily., Disp: , Rfl:  .  gabapentin (NEURONTIN) 800 MG tablet, Take 400-1,200 tablets by mouth 3 (three) times daily. Takes 400mg  at 0700 and  1600, then takes 1200mg  at bedtime, Disp: , Rfl:  .  gabapentin (NEURONTIN) 800 MG tablet, Take 1 tablet 2 (two) times daily by mouth., Disp: , Rfl:  .  meloxicam (MOBIC) 15 MG tablet, Take 15 mg by mouth daily., Disp: , Rfl:  .  meloxicam (MOBIC) 15 MG tablet, Take 1 tablet daily by mouth., Disp: , Rfl:  .  Multiple Vitamin (MULTIVITAMIN WITH MINERALS) TABS tablet, Take 1 tablet by mouth daily., Disp: , Rfl:  .  omeprazole (PRILOSEC) 20 MG capsule, Take 1 capsule by mouth daily with breakfast. , Disp: , Rfl:  .  omeprazole (PRILOSEC) 20 MG capsule, Take 1 capsule daily by mouth., Disp: , Rfl:  .  oxybutynin (DITROPAN-XL) 10 MG 24 hr tablet, Take 10 mg by mouth daily with breakfast., Disp: , Rfl:  .  oxybutynin (DITROPAN-XL) 10 MG 24 hr tablet, Take 1 tablet daily by mouth., Disp: , Rfl:  .  OXYGEN, Inhale 1 Inhaler See admin instructions into the lungs., Disp: , Rfl:  .  PARoxetine (PAXIL) 40 MG tablet, Take 1 tablet by mouth daily., Disp: , Rfl:  .  PARoxetine (PAXIL) 40 MG tablet,  Take 1 tablet daily by mouth., Disp: , Rfl:  .  Polyethyl Glycol-Propyl Glycol (SYSTANE ULTRA) 0.4-0.3 % SOLN, Place 1 drop into both eyes 2 (two) times daily., Disp: , Rfl:  .  Polyethyl Glycol-Propyl Glycol 0.4-0.3 % SOLN, Place 1 drop 2 (two) times daily into both eyes., Disp: , Rfl:  .  risperiDONE (RISPERDAL) 1 MG tablet, Take 1-2 tablets by mouth 2 (two) times daily. Takes 1mg  in the morning and 2mg  at bedtime, Disp: , Rfl:  .  risperiDONE (RISPERDAL) 1 MG tablet, Take 1 mg daily by mouth., Disp: , Rfl:  .  simvastatin (ZOCOR) 20 MG tablet, Take 1 tablet at bedtime by mouth., Disp: , Rfl:     Review of Systems     Objective:   Physical Exam Vitals:   05/22/17 1041 05/22/17 1050  BP: 110/70   Pulse: 89   SpO2: (!) 84% 96%  Weight: 144 lb (65.3 kg)   Height: 5' (1.524 m)     Estimated body mass index is 28.12 kg/m as calculated from the following:   Height as of this encounter: 5' (1.524 m).   Weight as of this encounter: 144 lb (65.3 kg).  Obese female sitting in the wheelchair. Speaks basic words follows simple commands. Lungs are clear obese abdomen normal heart sounds. No pedal edema abdomen is soft. Short stager     Assessment:       ICD-10-CM   1. Nocturnal hypoxemia G47.34   2. Hypoxemia R09.02   3. Dyspnea, unspecified type R06.00   4. Coronary artery calcification I25.10    I25.84   5. Family history of early CAD Z82.49        Plan:     Nocturnal hypoxemia Hypoxemia  - oxygen appears low even in day time - continue night o2  -start day time o2 2L [place] at rest - check cbc, bmet, troponin, d-dimer; will call with results - check CXR 2 view 05/22/2017     Coronary artery calcification Family history of early CAD  Saw cards nov 2017  Followup 3 months with APP   > 50% of this > 25 min visit spent in face to face counseling or coordination of care    Dr. Kalman ShanMurali Slyvia Lartigue, M.D., [facility]P Pulmonary and Critical Care Medicine Staff  Physician [facility]  Pulmonary and Critical Care Pager: [phone], If no answer or between  15:00h - 7:00h: call [phone]  05/22/2017 11:13 AM

## 2017-05-23 ENCOUNTER — Telehealth: Payer: Self-pay | Admitting: Internal Medicine

## 2017-05-23 DIAGNOSIS — R7989 Other specified abnormal findings of blood chemistry: Secondary | ICD-10-CM

## 2017-05-23 DIAGNOSIS — R0602 Shortness of breath: Secondary | ICD-10-CM

## 2017-05-23 DIAGNOSIS — J9611 Chronic respiratory failure with hypoxia: Secondary | ICD-10-CM

## 2017-05-23 LAB — D-DIMER, QUANTITATIVE (NOT AT ARMC): D DIMER QUANT: 0.99 ug{FEU}/mL — AB (ref <0.50)

## 2017-05-23 NOTE — Telephone Encounter (Signed)
[facility] and spoke with Liver[place]Kelly regarding pt having an elevated d-dimer and was wondering if they would be able to provide transportation for pt to have a CTA done.  Tresa EndoKelly stated to me that they would be able to provide transportation for pt if we could get her scheduled Thursday between the hours of 9-3.  Tresa EndoKelly asked me if I had called pt's caregiver, [name], and I stated to Tresa EndoKelly that I had called and spoke with [name] stating that we needed to get this test done ASAP to rule out a PE but she would not be able to take pt until next Tuesday, 05/29/17.  Tresa EndoKelly said that they would provide pt transportation Thursday, 05/24/17 if could get scheduled between their hours of transportation.  [facility] with [facility] PenSherry about this, and she called [facility]acey [facility] and got pt scheduled for the CTA.  Nothing further needed.

## 2017-05-23 NOTE — Telephone Encounter (Signed)
Called and spoke to Amy Solis who states she is the pt's caregiver, letting her know that pt had elevated d-dimer labwork.  Based off of the elevated d-dimer, MR wants pt to have a CTA either today, 05/23/17 or tomorrow, 05/24/17 to rule out PE.  When telling Amy Solis this, Amy Solis told me that we would have to schedule pt to have the CTA done next Tuesday, 05/29/17 due to her having another job which is her primary job and she cannot just drop things to take pt to an appt with so short notice.  I stated to Amy Solis that if the pt did have a PE, it could be very serious and that is why we need to get the test done ASAP to rule out a PE for pt due to her having an elevated d-dimer.  Amy Solis stated to me that she understood that I was telling her this could be serious but the CTA would have to be done next Tuesday, 05/29/17 at the earliest date.

## 2017-05-23 NOTE — Telephone Encounter (Signed)
Spoke with the [name] and notified of recs per MR and she verbalized understanding, and nothing further needed

## 2017-05-23 NOTE — Addendum Note (Signed)
Addended by: Wyvonne LenzPINION, Severin Bou P on: 05/23/2017 11:17 AM   Modules accepted: Orders

## 2017-05-23 NOTE — Addendum Note (Signed)
Addended by: Kandice HamsHOEFLER, Emilian Stawicki D on: 05/23/2017 11:15 AM   Modules accepted: Orders

## 2017-05-23 NOTE — Telephone Encounter (Signed)
Spoke with Devonyia and she needs an order for pt to have oxygen continuous since this is what MR requested. Instructions per AVS MR wanted her on continuous. I will place order. Nothing further is needed.   [phone]

## 2017-05-23 NOTE — Telephone Encounter (Signed)
Spoke with Lub[employer]ol Corporationatasha. She stated that the patient has an appt with Dr. Elease HashimotoNahser on 07/31/17. She wants to know if the appt that far out is acceptable to MR.   MR, please advise. Thanks!

## 2017-05-23 NOTE — Telephone Encounter (Signed)
  ddimer high 0.99 - other labs ok.   PLAN Do CTA rule out PE 05/23/2017 or 05/24/17 - call report to doc of the day because eI am out of town  Dr. Kalman ShanMurali Autum Benfer, M.D., [facility].P Pulmonary and Critical Care Medicine Staff Physician [facility] Pulmonary and Critical Care Pager: [phone], If no answer or between  15:00h - 7:00h: call [phone]  05/23/2017 11:45 AM     PULMONARY No results for input(s): PHART, PCO2ART, PO2ART, HCO3, TCO2, O2SAT in the last 168 hours.  Invalid input(s): PCO2, PO2  CBC Recent Labs  Lab 05/22/17 1145  HGB 14.8  HCT 44.4  WBC 4.2  PLT 305.0    COAGULATION No results for input(s): INR in the last 168 hours.  CARDIAC  No results for input(s): TROPONINI in the last 168 hours. No results for input(s): PROBNP in the last 168 hours.   CHEMISTRY Recent Labs  Lab 05/22/17 1145  NA 142  K 3.7  CL 107  CO2 27  GLUCOSE 88  BUN 18  CREATININE 0.83  CALCIUM 9.1   Estimated Creatinine Clearance: 54.7 mL/min (by C-G formula based on SCr of 0.83 mg/dL).   LIVER No results for input(s): AST, ALT, ALKPHOS, BILITOT, PROT, ALBUMIN, INR in the last 168 hours.   INFECTIOUS No results for input(s): LATICACIDVEN, PROCALCITON in the last 168 hours.   ENDOCRINE CBG (last 3)  No results for input(s): GLUCAP in the last 72 hours.       IMAGING x48h  - image(s) personally visualized  -   highlighted in bold Dg Chest 2 View  Result Date: 05/22/2017 CLINICAL DATA:  Chest pain, shortness of breath. EXAM: CHEST  2 VIEW COMPARISON:  Radiograph of June 22, 2013. FINDINGS: Stable cardiomediastinal silhouette. No pneumothorax or pleural effusion is noted. Stable elevated right hemidiaphragm is noted. Atherosclerosis of thoracic aorta is noted. Increased bilateral perihilar and basilar interstitial densities are noted concerning for pulmonary edema. Bony thorax is unremarkable. IMPRESSION: Aortic atherosclerosis. Stable  elevated right hemidiaphragm. Increased bilateral perihilar and basilar interstitial densities are noted concerning for pulmonary edema. Electronically Signed   By: Lupita RaiderJames  Green Jr, M.D.   On: 05/22/2017 17:18

## 2017-05-23 NOTE — Telephone Encounter (Signed)
Jan 2019 is fine  Dr. Kalman ShanMurali Kaedyn Polivka, M.D., [facility].P Pulmonary and Critical Care Medicine Staff Physician [facility] Pulmonary and Critical Care Pager: [phone], If no answer or between  15:00h - 7:00h: call [phone]  05/23/2017 11:33 AM

## 2017-05-24 ENCOUNTER — Telehealth: Payer: Self-pay | Admitting: Internal Medicine

## 2017-05-24 ENCOUNTER — Ambulatory Visit (INDEPENDENT_AMBULATORY_CARE_PROVIDER_SITE_OTHER)
Admission: RE | Admit: 2017-05-24 | Discharge: 2017-05-24 | Disposition: A | Discharge status: Home / Self Care | Payer: Medicare Other | Source: Ambulatory Visit | Attending: Internal Medicine | Admitting: Internal Medicine

## 2017-05-24 DIAGNOSIS — R0602 Shortness of breath: Secondary | ICD-10-CM | POA: Diagnosis not present

## 2017-05-24 DIAGNOSIS — R7989 Other specified abnormal findings of blood chemistry: Secondary | ICD-10-CM | POA: Diagnosis not present

## 2017-05-24 MED ORDER — IOPAMIDOL (ISOVUE-370) INJECTION 76%
80.0000 mL | Freq: Once | INTRAVENOUS | Status: AC | PRN
  Administered 2017-05-24: 80 mL via INTRAVENOUS

## 2017-05-24 NOTE — Telephone Encounter (Signed)
MR please advise of the CT results so that these may be given to the pts private nurse that cares for the pt.  Thanks

## 2017-05-25 NOTE — Telephone Encounter (Signed)
[facility] per requested. Nothing further is needed and the caregiver will call back to schedule the appt.

## 2017-05-25 NOTE — Telephone Encounter (Signed)
ATC pt, no answer. Left message for pt to call back.  

## 2017-05-25 NOTE — Telephone Encounter (Signed)
No blood clot No pneumonia No lung cancer No pulmonary fibrosis  So all good news. However  1. There might be mild pulmonary edema - plan lasix 20mg  per day with kcl 10meq per day x 2 weeks 2.  does have coronary artery calcification - they need to talk to cardiology about this at their followup 3. ROV with app in 2-3 weeks to see respnse to lasix    Dg Chest 2 View  Result Date: 05/22/2017 CLINICAL DATA:  Chest pain, shortness of breath. EXAM: CHEST  2 VIEW COMPARISON:  Radiograph of June 22, 2013. FINDINGS: Stable cardiomediastinal silhouette. No pneumothorax or pleural effusion is noted. Stable elevated right hemidiaphragm is noted. Atherosclerosis of thoracic aorta is noted. Increased bilateral perihilar and basilar interstitial densities are noted concerning for pulmonary edema. Bony thorax is unremarkable. IMPRESSION: Aortic atherosclerosis. Stable elevated right hemidiaphragm. Increased bilateral perihilar and basilar interstitial densities are noted concerning for pulmonary edema. Electronically Signed   By: Lupita RaiderJames  Green Jr, M.D.   On: 05/22/2017 17:18   Ct Angio Chest W/cm &/or Wo Cm  Result Date: 05/24/2017 CLINICAL DATA:  Shortness of breath.  Elevated D-dimer. EXAM: CT ANGIOGRAPHY CHEST WITH CONTRAST TECHNIQUE: Multidetector CT imaging of the chest was performed using the standard protocol during bolus administration of intravenous contrast. Multiplanar CT image reconstructions and MIPs were obtained to evaluate the vascular anatomy. CONTRAST:  80 cc Isovue 370 COMPARISON:  Chest radiographs dated 05/22/2017 and chest CT dated 03/23/2016. FINDINGS: Cardiovascular: Normally opacified pulmonary arteries with no pulmonary arterial filling defects. Normal size aorta. Atheromatous arterial calcifications, including coronary artery and aortic calcifications. The ascending thoracic aorta previously measured 4.2 cm in maximum diameter at the level of the main pulmonary artery and currently  measures 3.9 cm in maximum diameter. The central pulmonary arteries are mildly enlarged with the main pulmonary artery measuring 3.4 cm and diameter on image number 81 series 5. Mediastinum/Nodes: No enlarged mediastinal, hilar, or axillary lymph nodes. Thyroid gland, trachea, and esophagus demonstrate no significant findings. Lungs/Pleura: Mild increase in prominence of the interstitial markings bilaterally. No pleural fluid. Upper Abdomen: Mild bilateral adrenal hyperplasia. Musculoskeletal: Stable elevated right hemidiaphragm. Mild thoracic spine degenerative changes. Review of the MIP images confirms the above findings. IMPRESSION: 1. No pulmonary emboli. 2. Interval mild prominence of the interstitial markings, possibly due to a mild interstitial pulmonary edema. 3. Stable elevated right hemidiaphragm. 4. Mild central pulmonary artery enlargement, suggesting mild pulmonary arterial hypertension. 5. The ascending thoracic aorta is currently within normal limits in size of 3.9 cm in maximum diameter. 6.  Calcific coronary artery and aortic atherosclerosis. Aortic Atherosclerosis (ICD10-I70.0). Electronically Signed   By: Beckie SaltsSteven  Reid M.D.   On: 05/24/2017 12:51

## 2017-06-14 ENCOUNTER — Ambulatory Visit (INDEPENDENT_AMBULATORY_CARE_PROVIDER_SITE_OTHER): Payer: Medicare Other | Admitting: Internal Medicine

## 2017-06-14 ENCOUNTER — Encounter: Payer: Self-pay | Admitting: Internal Medicine

## 2017-06-14 VITALS — BP 124/80 | HR 85 | Ht 60.0 in | Wt 144.2 lb

## 2017-06-14 DIAGNOSIS — I2584 Coronary atherosclerosis due to calcified coronary lesion: Secondary | ICD-10-CM | POA: Diagnosis not present

## 2017-06-14 DIAGNOSIS — J9611 Chronic respiratory failure with hypoxia: Secondary | ICD-10-CM

## 2017-06-14 DIAGNOSIS — I2510 Atherosclerotic heart disease of native coronary artery without angina pectoris: Secondary | ICD-10-CM

## 2017-06-14 NOTE — Patient Instructions (Signed)
ICD-10-CM   1. Chronic respiratory failure with hypoxia (HCC) J96.11     Not fully explained other than obesity with possible hypoventilation and elevated diaphragm on right side Not sure why you did not respond to lasix  plan Will need to see if what cardiology makes of your coronary artery calcification and mild edema seen on CT chest in 2018  - consider Coronary artery disease of shunt  Continue o2 for now  Followup 4-6 months; depending on cardiology might consider sleep referral

## 2017-06-14 NOTE — Progress Notes (Signed)
Subjective:     Patient ID: Amy Solis, female   DOB: July 22, 1950, 66 y.o.   MRN: [id number]  HPI  PCP Amy Inch, MD  HPI  OV 03/09/2016  Chief Complaint  Patient presents with  . Pulmonary Consult    [facility] [place] [facility] lung scarring. Pt denies SOB, cough and CP/tightness.    66 year old lady. 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 She is always in a wheelchair because of knee issues. She lives in [place]. She is accompanied by a caretaker who knows her somewhat only. Her name is Amy Solis. We do not have old charts with her . As best as I can figure out whether she tells me that patient has been on oxygen for many years through [facility] in [place]. But because patient lives in [facility] patient cannot go to that center anymore. Impression is wondering if patient can come off oxygen. Patient herself denies any cough or shortness of breath although it is shortness of breath that is mild.-Also states that intermittently when oxygen on some patient does not have any problems. No one knows what her pulse ox's. Is also chest x-ray was CT scan or any images. It is possible patient has dyspnea but she is mostly in the wheelchair and she can watch TV and understand basic stuff.    has a past medical history of Depressive disorder; Hyperlipemia; Hypertension; Mild mental retardation; and Osteoporosis.   reports that she has never smoked. She has never used smokeless tobacco.   OV 05/09/2016  Chief Complaint  Patient presents with  . Follow-up    Pt's caregiver states she thinks her breathing seems to be doing well. Pt denies CP/tightness and f/c/s. Pt here after ONO and HRCT.    Follow-up oxygen evaluation with possible dyspnea    Patient presents again for follow-up of test results with regular [name].Patient smoked communicative today. Amy Solis tells me that  overnight oxygen is not been started despite positive results. Patient also says that both her father and mother died in the 73s and brother died in his 80s from coronary artery disease. CT scan of the chest in September 2017 did not show any issues in the lung parenchyma. 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 CT scan also shows coronary artery calcifications. Echocardiogram shows grade 1 diastolic dysfunction. Overnight saturations test was positive. We started her on oxygen therapy but this has not been implemented at the living facility. She tells me patient snores. She has not been formally evaluated for sleep apnea  Off note patient is still very sedentary. She hardly walks. Amy Solis tells me that at facility they plan to ambulate slowly with a walker. At rest there is no dyspnea    CT 03/23/16  - no emphsyema, no lung cancer, no nodules, no ILD - good news  - right diaprhagm is paralyzed and we do not know cause - there is coronary artery calcfication -   ONO - RAM - < /-= 88% 2h 29% of sleep - use 2L O2 at night. Also, hypoxemia unexplained  Impressions: ECHO 04/20/16 - Normal LV systolic function; mild LVH; grade 1 diastolic   dysfunction; mild MR; mild LAE; trace TR; density noted in RA   likely represents prominent eustachian valve.     12/28/2016 6 month follow up: Pt. Presents for 6 month follow up. She lives at [facility].Pt. States she is doing  well at [facility]. She states that she has not had any cough or breathing issues. She states that [facility] is not placing her on her oxygen at night. Additionally she is not being placed on oxygen during the day prn. She is here with her caregiver today. Saturation was 85% on RA today in the office. She does immediately rebound to greater than 92% on oxygen. She denies fever, chest pain, orthopnea or hemoptysis.   OV 05/22/2017  Chief Complaint  Patient presents with  . Follow-up    breathing  getting worse, sleeping alot, bending over in the chair which makes it more difficult to breath,      66 year old lady. 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 She is always in a wheelchair because of knee issues. She lives in [place]. She is on oxygen 2L at night and she wears it prn throughout the day. Unexplained hypoxemia    66 year old female with mild mental retardation and obesity has unexplained hypoxemia at night when liters of oxygen. She is here with a caretaker Amy Solis who tells me that for the last month or so the reports in the facility the patient has been more fatigued in the daytime, adopting a more hunched posture and everything is all related to lung disease. Today resting she did have hypoxemia which I believe is new compared to before.. Patient is only on nighttime oxygen. Amy Solis is not able to give me any more history other than the above. Amy Solis does not know if there is worsening pedal edema or hemoptysis. Only thing that she could tell me was that patient has an altered sleep cycle. Review of the chart shows she has coronary artery calcification and a family history of coronary artery disease- saw cards Nov 2017   OV 06/14/2017  Chief Complaint  Patient presents with  . Follow-up    CT scan done 05/24/17.  Pt states that she has been doing good since last visit. Caregiver states that she thinks pt is more alert.  DME: [facility], 41L O87    66 year old Amy Solis with her caretaker Amy Solis.  She has unexplained hypoxemia beyond explanation of diastolic dysfunction, obesity and elevation of the right hemidiaphragm.  At last visit we did a CT angiogram chest that suggested mild interstitial edema.  We started her on Lasix but she still is hypoxemic as of today.  We will only give a trial run of Lasix.  But according to the caretaker she is more alert at the nursing facility.  She does have coronary artery calcification and she has a cardiology  appointment pending middle of January 2019.  She continues to be on oxygen.  There are no other issues.  Of note in 2017 she did have a high-resolution CT chest that did not show any interstitial lung disease.  It is unclear to me if she has sleep apnea or not.    has a past medical history of Depressive disorder, Hyperlipemia, Hypertension, Mild mental retardation, and Osteoporosis.   reports that  has never smoked. she has never used smokeless tobacco.  Past Surgical History:  Procedure Laterality Date  . ABDOMINAL HYSTERECTOMY      No Known Allergies  Immunization History  Administered Date(s) Administered  . Influenza, High Dose Seasonal PF 06/01/2014, 06/01/2014, 07/07/2015, 04/21/2017  . Influenza,trivalent, recombinat, inj, PF 05/01/2011, 04/16/2014  . Influenza-Unspecified 05/13/2012, 04/15/2013  . PPD Test 09/27/2015  . Pneumococcal Conjugate-13 06/01/2014  . Pneumococcal Polysaccharide-23 07/13/2009, 07/07/2015  .  Tdap 07/13/2009  . Zoster 01/22/2010    Family History  Problem Relation Age of Onset  . Breast cancer Mother   . Heart attack Father   . Heart attack Brother      Current Outpatient Medications:  .  amLODipine-benazepril (LOTREL) 5-20 MG capsule, Take 1 capsule daily by mouth., Disp: , Rfl:  .  ammonium lactate (AMLACTIN) 12 % cream, Apply topically as needed for dry skin., Disp: , Rfl:  .  aspirin 81 MG tablet, Take 1 tablet daily by mouth., Disp: , Rfl:  .  atorvastatin (LIPITOR) 10 MG tablet, Take 10 mg by mouth daily., Disp: , Rfl:  .  fluticasone (FLONASE) 50 MCG/ACT nasal spray, Place 2 sprays into both nostrils daily., Disp: , Rfl:  .  furosemide (LASIX) 20 MG tablet, Take 20 mg by mouth daily., Disp: , Rfl:  .  gabapentin (NEURONTIN) 800 MG tablet, Take 400-1,200 tablets by mouth 3 (three) times daily. Takes 400mg  at 0700 and 1600, then takes 1200mg  at bedtime, Disp: , Rfl:  .  meloxicam (MOBIC) 15 MG tablet, Take 15 mg by mouth daily., Disp: ,  Rfl:  .  Multiple Vitamin (MULTIVITAMIN WITH MINERALS) TABS tablet, Take 1 tablet by mouth daily., Disp: , Rfl:  .  omeprazole (PRILOSEC) 20 MG capsule, Take 1 capsule by mouth daily with breakfast. , Disp: , Rfl:  .  oxybutynin (DITROPAN-XL) 10 MG 24 hr tablet, Take 10 mg by mouth daily with breakfast., Disp: , Rfl:  .  OXYGEN, Inhale 1 Inhaler See admin instructions into the lungs., Disp: , Rfl:  .  PARoxetine (PAXIL) 30 MG tablet, Take 1 tablet by mouth daily., Disp: , Rfl:  .  Polyethyl Glycol-Propyl Glycol (SYSTANE ULTRA) 0.4-0.3 % SOLN, Place 1 drop into both eyes 2 (two) times daily., Disp: , Rfl:  .  potassium chloride (K-DUR) 10 MEQ tablet, Take 10 mEq by mouth daily., Disp: , Rfl:  .  risperiDONE (RISPERDAL) 1 MG tablet, Take 1-2 tablets by mouth 2 (two) times daily. Takes 1mg  in the morning and 2mg  at bedtime, Disp: , Rfl:  .  simvastatin (ZOCOR) 20 MG tablet, Take 1 tablet at bedtime by mouth., Disp: , Rfl:    Review of Systems     Objective:   Physical Exam Vitals:   06/14/17 1121  BP: 124/80  Pulse: 85  SpO2: (!) 89%  Weight: 144 lb 3.2 oz (65.4 kg)  Height: 5' (1.524 m)    Estimated body mass index is 28.16 kg/m as calculated from the following:   Height as of this encounter: 5' (1.524 m).   Weight as of this encounter: 144 lb 3.2 oz (65.4 kg). Obese female sitting in the wheelchair on oxygen on.  Alert.  Oriented within her mental capacity.  She is aware that she is being seen by physician.  Does not talk much.  Lungs are clear to auscultation.  Normal heart sounds obese abdomen not much of chronic venous stasis edema.  Looks deconditioned    Assessment:       ICD-10-CM   1. Chronic respiratory failure with hypoxia (HCC) J96.11        Plan:       Not fully explained other than obesity with possible hypoventilation and elevated diaphragm on right side Not sure why you did not respond to lasix  plan Will need to see if what cardiology makes of your  coronary artery calcification and mild edema seen on CT chest in 2018  -  consider Coronary artery disease of shunt  Continue o2 for now  Followup 4-6 months; depending on cardiology might consider sleep referral   (> 50% of this 15 min visit spent in face to face counseling or/and coordination of care)  Dr. Kalman ShanMurali Lenice Koper, M.D., [facility]P Pulmonary and Critical Care Medicine Staff Physician, [facility] Center Director - Interstitial Lung Disease  Program  Pulmonary Fibrosis [place] [facility] - [facility] [place], [place], [id number]  Pager: [phone], If no answer or between  15:00h - 7:00h: call [phone] Telephone: [phone]

## 2017-07-04 ENCOUNTER — Telehealth: Payer: Self-pay | Admitting: Internal Medicine

## 2017-07-04 NOTE — Telephone Encounter (Signed)
LMTCB for TXU CorpMarion

## 2017-07-04 NOTE — Telephone Encounter (Signed)
Amy LimerickMarion returned call - she is requesting us to call her at [phone] we will have to ask for her and to not call before 2pm -pr

## 2017-07-04 NOTE — Telephone Encounter (Signed)
Spoke with Shirlee LimerickMarion who is an Pensions consultantattorney at law firm the pt's family is using for POA. Shirlee LimerickMarion states that the pt's caregiver, [name] is wanting to get the pt an [employer] system. I explained to Shirlee LimerickMarion that I would contact [name] to make these arrangements. Pt will need to be scheduled for an OV so that we can requalify her for oxygen. lmtcb x1 for [name] to make this appointment.

## 2017-07-05 NOTE — Telephone Encounter (Signed)
lmtcb X2 for Amy Solis (dpr on file, pt's caregiver).

## 2017-07-06 NOTE — Telephone Encounter (Signed)
[name] called back - scheduled patient with Tammy Parrett on 07/27/2017 -pr

## 2017-07-27 ENCOUNTER — Ambulatory Visit: Payer: Medicare Other | Admitting: Adult Health

## 2017-07-31 ENCOUNTER — Ambulatory Visit (INDEPENDENT_AMBULATORY_CARE_PROVIDER_SITE_OTHER): Payer: Medicare Other | Admitting: Cardiovascular Disease

## 2017-07-31 ENCOUNTER — Encounter (INDEPENDENT_AMBULATORY_CARE_PROVIDER_SITE_OTHER): Payer: Self-pay

## 2017-07-31 ENCOUNTER — Encounter: Payer: Self-pay | Admitting: Cardiovascular Disease

## 2017-07-31 VITALS — BP 144/88 | Wt 142.0 lb

## 2017-07-31 DIAGNOSIS — I2510 Atherosclerotic heart disease of native coronary artery without angina pectoris: Secondary | ICD-10-CM | POA: Diagnosis not present

## 2017-07-31 DIAGNOSIS — I519 Heart disease, unspecified: Secondary | ICD-10-CM | POA: Diagnosis not present

## 2017-07-31 DIAGNOSIS — I2584 Coronary atherosclerosis due to calcified coronary lesion: Secondary | ICD-10-CM | POA: Diagnosis not present

## 2017-07-31 DIAGNOSIS — I5189 Other ill-defined heart diseases: Secondary | ICD-10-CM

## 2017-07-31 NOTE — Progress Notes (Addendum)
Cardiology Office Note   Date:  07/31/2017   ID:  [name], DOB 04-Oct-1950, MRN [id number]  PCP:  Eartha Inch, MD  Cardiologist:   Kristeen Miss, MD   Chief Complaint  Patient presents with  . Follow-up    coronary artery calcification   Problem list 1. Mental retardation 2. Hyperlipidemia 3. Essential hypertension 4. Coronary artery calcifications   History of Present Illness: Amy Solis is a 67 y.o. female who presents for further evaluations of coronary calcifications over incidentally noted on CT scan.  Amy Solis has been on home oxygen for quite some time. She is a resident at [facility]. She was recently seen by [name], M.D who ordered a CT scan of her lung. Just noted have coronary calcifications in the LM and all 3 coronary arteries.   Pt has been at a group home for years and now lives at  [facility] .   Was seen with her private care giver, [name].   [name] is able to speak but almost all of the history is from her caregiver   She is wheel chair bound Has knee pain .  Her private caregiver says that she is supposed to have a procedure in her knee which may allow her to walk with a walker.     Eats an unrestricted diet She eats a fair bit of salt in the diet   Primary MD is [name], MD   Denies Any chest pain or shortness breath. She denies any syncope or presyncope.  [name] 15, 2019:  Amy Solis is a 67 yo with mental retardation.  Hypoxemia.  Echo in Oct. 2017 shows normal LV systolic function ,  Grade 1 diastolic dysfunction   She tried coming off her home oxygen but her caregivers noted that she became hypoxic.  She saw pulmonary and it was recommended that she continue with home oxygen 24 hours a day.  She is basically wheelchair bound.   Walks very little  with PT and with her waker.    Past Medical History:  Diagnosis Date  . Depressive disorder   . Hyperlipemia   . Hypertension   . Mild mental retardation     . Osteoporosis     Past Surgical History:  Procedure Laterality Date  . ABDOMINAL HYSTERECTOMY       Current Outpatient Medications  Medication Sig Dispense Refill  . amLODipine-benazepril (LOTREL) 5-20 MG capsule Take 1 capsule daily by mouth.    Marland Kitchen ammonium lactate (AMLACTIN) 12 % cream Apply topically as needed for dry skin.    . Artificial Tear Ointment (ARTIFICIAL TEARS) ointment Place 1 application into both eyes at bedtime.    Marland Kitchen aspirin 81 MG tablet Take 1 tablet daily by mouth.    Marland Kitchen atorvastatin (LIPITOR) 10 MG tablet Take 10 mg by mouth daily.    Marland Kitchen Dextromethorphan-Quinidine (NUEDEXTA) 20-10 MG CAPS Take 1 tablet by mouth 2 (two) times daily.    . fluticasone (FLONASE) 50 MCG/ACT nasal spray Place 2 sprays into both nostrils daily.    Marland Kitchen gabapentin (NEURONTIN) 800 MG tablet Take 400-1,200 tablets by mouth 3 (three) times daily. Takes 400mg  at 0700 and 1600, then takes 1200mg  at bedtime    . loperamide (IMODIUM A-D) 2 MG tablet Take 2 mg by mouth every 6 (six) hours as needed for diarrhea or loose stools.    . meloxicam (MOBIC) 15 MG tablet Take 15 mg by mouth daily.    . methimazole (TAPAZOLE)  5 MG tablet Take 5 mg by mouth daily.    . Multiple Vitamin (MULTIVITAMIN WITH MINERALS) TABS tablet Take 1 tablet by mouth daily.    Marland Kitchen. omeprazole (PRILOSEC) 20 MG capsule Take 1 capsule by mouth daily with breakfast.     . oxybutynin (DITROPAN-XL) 10 MG 24 hr tablet Take 10 mg by mouth daily with breakfast.    . OXYGEN Inhale 1 Inhaler See admin instructions into the lungs.    Marland Kitchen. PARoxetine (PAXIL) 30 MG tablet Take 1 tablet by mouth daily.    Bertram Gala. Polyethyl Glycol-Propyl Glycol (SYSTANE ULTRA) 0.4-0.3 % SOLN Place 1 drop into both eyes 2 (two) times daily.    . risperiDONE (RISPERDAL) 1 MG tablet Take 1-2 tablets by mouth 2 (two) times daily. Takes 1mg  in the morning and 2mg  at bedtime    . SODIUM FLUORIDE, DENTAL RINSE, (PREVIDENT) 0.2 % SOLN Use as directed 1 application in the mouth or  throat 2 (two) times daily.     No current facility-administered medications for this visit.     Allergies:   Patient has no known allergies.    Social History:  The patient  reports that  has never smoked. she has never used smokeless tobacco. She reports that she does not drink alcohol or use drugs.   Family History:  The patient's family history includes Breast cancer in her mother; Heart attack in her brother and father.    ROS:  Please see the history of present illness.    Physical Exam: Blood pressure (!) 144/88, weight 142 lb (64.4 kg), SpO2 93 %.  GEN:  Middle age female,  Minimally interactive,  Slumped over in the chair  HEENT: Normal NECK: No JVD; No carotid bruits LYMPHATICS: No lymphadenopathy CARDIAC:   RR , no significant murmur  RESPIRATORY:  Clear to auscultation without rales, wheezing or rhonchi  ABDOMEN: Soft, non-tender, non-distended MUSCULOSKELETAL:  No edema; No deformity  SKIN: Warm and dry NEUROLOGIC:  Alert and oriented x 3   EKG: July 31, 2017: Normal sinus rhythm at 91 beats a minute.  No ST or T wave changes.  Normal EKG.  Recent Labs: 05/22/2017: BUN 18; Creatinine, Ser 0.83; Hemoglobin 14.8; Platelets 305.0; Potassium 3.7; Sodium 142    Lipid Panel No results found for: CHOL, TRIG, HDL, CHOLHDL, VLDL, LDLCALC, LDLDIRECT    Wt Readings from Last 3 Encounters:  07/31/17 142 lb (64.4 kg)  06/14/17 144 lb 3.2 oz (65.4 kg)  05/22/17 144 lb (65.3 kg)      Other studies Reviewed: Additional studies/ records that were reviewed today include: . Review of the above records demonstrates:    ASSESSMENT AND PLAN:  1.  Cornea artery calcifications: [name] is completely asymptomatic  No angina  She is essentially wheelchair-bound and is not a candidate for any invasive or interventional procedures.  I do not think it would be productive to do a Myoview study since she is not exceptionally poor candidate for heart catheterization.  She's on  atorvastatin and I presume that she has some hyperlipidemia. We should consider the fairly aggressive with her atorvastatin therapy.  2. Chronic diastolic dysfunction: She has chronic diastolic dysfunction that I suspect is contributing to her some of her shortness of breath.  She has had prolonged hypertension.  At this point I do not think she needs any additional workup except for better blood pressure control.  3. Hypoxemia: She may have some underlying lung disease.  I do not see pulmonary function test  but I suspect that she has significant restrictive defect-likely from her posture and body habitus. Essentially wheelchair-bound.  Further recommendations per pulmonary.  3. Hyperlipidemia: This is managed by Dr.  Cyndia Bent.    Current medicines are reviewed at length with the patient today.  The patient does not have concerns regarding medicines.  Labs/ tests ordered today include:   Orders Placed This Encounter  Procedures  . EKG 12-Lead     Disposition:   FU with me as needed.      Kristeen Miss, MD  07/31/2017 5:20 PM    [facility] [address] [place], [place], [place]  [address] Phone: [phone]; Fax: [phone]

## 2017-07-31 NOTE — Patient Instructions (Signed)
Medication Instructions:  Your physician recommends that you continue on your current medications as directed. Please refer to the Current Medication list given to you today.   Labwork: none  Testing/Procedures: none  Follow-Up: Your physician recommends that you schedule a follow-up appointment as needed with Dr. Nahser   Any Other Special Instructions Will Be Listed Below (If Applicable).     If you need a refill on your cardiac medications before your next appointment, please call your pharmacy.   

## 2017-08-09 ENCOUNTER — Ambulatory Visit: Payer: Medicare Other | Admitting: Acute Care

## 2017-08-16 ENCOUNTER — Ambulatory Visit: Payer: Medicare Other | Admitting: Acute Care

## 2017-08-21 ENCOUNTER — Encounter: Payer: Self-pay | Admitting: Adult Health

## 2017-08-21 ENCOUNTER — Ambulatory Visit (INDEPENDENT_AMBULATORY_CARE_PROVIDER_SITE_OTHER): Payer: Medicare Other | Admitting: Adult Health

## 2017-08-21 ENCOUNTER — Telehealth: Payer: Self-pay | Admitting: Adult Health

## 2017-08-21 VITALS — BP 114/66 | HR 87 | Ht 62.0 in | Wt 142.6 lb

## 2017-08-21 DIAGNOSIS — I2510 Atherosclerotic heart disease of native coronary artery without angina pectoris: Secondary | ICD-10-CM | POA: Diagnosis not present

## 2017-08-21 DIAGNOSIS — I2584 Coronary atherosclerosis due to calcified coronary lesion: Secondary | ICD-10-CM | POA: Diagnosis not present

## 2017-08-21 DIAGNOSIS — R0600 Dyspnea, unspecified: Secondary | ICD-10-CM | POA: Diagnosis not present

## 2017-08-21 DIAGNOSIS — R0902 Hypoxemia: Secondary | ICD-10-CM

## 2017-08-21 NOTE — Progress Notes (Signed)
 @Patient  ID: Amy RossettiSusan F Solis, female    DOB: [birth year], 67 y.o.   MRN: [id number]  Chief Complaint  Patient presents with  . Follow-up    O2 RF     Referring provider: Eartha InchBadger, Michael C, MD  HPI: 67 yo female never smoker followed for chronic hypoxic respiratory failure on oxygen 2l/m felt secondary to diastolic dysfunction , morbid obesity and elevation of right hemidiaphragm.  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 At assisted Living . 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 Also, hypoxemia unexplained  Impressions: ECHO 04/20/16 - Normal LV systolic function; mild LVH; grade 1 diastolic dysfunction; mild MR; mild LAE; trace TR; density noted in RA likely represents prominent eustachian valve.  08/21/2017 Follow up : O2 RF  Patient presents for a 7755-month follow-up.  Patient has known chronic hypoxemia.  She is on oxygen 2 L.  It is felt that her hypoxemia is most likely due to chronic diastolic dysfunction morbid obesity and elevation of her right hemidiaphragm.  She is essentially wheelchair dependent.  Patient says she is comfortable and oxygen does not have shortness of breath.  She is accompanied by caregiver who says that she does well on oxygen.  Patient has a portable oxygen concentrator when she uses when she is out and going around her assisted living.  This has stopped working on a consistent basis and needs an order for new machine.  O2 saturations on room air are 87%.  On oxygen her O2 saturations are 92%.  On 2 L of oxygen. She denies any chest pain orthopnea PND or increased leg swelling. Patient was referred to cardiology last visit for coronary calcifications noted on CT scan.  Patient was seen by cardiology last month.   And was not recommending for any additional testing.Marland Kitchen.   No Known Allergies  Immunization History  Administered Date(s) Administered  . Influenza, High Dose Seasonal PF 06/01/2014, 06/01/2014, 07/07/2015, 04/21/2017  . Influenza,trivalent, recombinat, inj, PF 05/01/2011, 04/16/2014  . Influenza-Unspecified 05/13/2012, 04/15/2013  . PPD Test 09/27/2015  . Pneumococcal Conjugate-13 06/01/2014  . Pneumococcal Polysaccharide-23 07/13/2009, 07/07/2015  . Tdap 07/13/2009  . Zoster 01/22/2010    Past Medical History:  Diagnosis Date  . Depressive disorder   . Hyperlipemia   . Hypertension   . Mild mental retardation   . Osteoporosis     Tobacco History: Social History   Tobacco Use  Smoking Status Never Smoker  Smokeless Tobacco Never Used   Counseling given: Not Answered   Outpatient Encounter Medications as of 08/21/2017  Medication Sig  . amLODipine-benazepril (LOTREL) 5-20 MG capsule Take 1 capsule daily by mouth.  Marland Kitchen. ammonium lactate (AMLACTIN) 12 % cream Apply topically as needed for dry skin.  . Artificial Tear Ointment (ARTIFICIAL TEARS) ointment Place 1 application into both eyes at bedtime.  Marland Kitchen. aspirin 81 MG tablet Take 1 tablet daily by mouth.  Marland Kitchen. atorvastatin (LIPITOR) 10 MG tablet Take 10 mg by mouth daily.  Marland Kitchen. Dextromethorphan-Quinidine (NUEDEXTA) 20-10 MG CAPS Take 1 tablet by mouth 2 (two) times daily.  . fluticasone (FLONASE) 50 MCG/ACT nasal spray Place 2 sprays into both nostrils daily.  Marland Kitchen. gabapentin (NEURONTIN) 800  MG tablet Take 400-1,200 tablets by mouth 3 (three) times daily. Takes 400mg  at 0700 and 1600, then takes 1200mg  at bedtime  . loperamide (IMODIUM A-D) 2 MG tablet Take 2 mg by mouth every 6 (six) hours as needed for diarrhea or loose stools.  . meloxicam (MOBIC) 15 MG tablet Take 15 mg by mouth daily.  . methimazole (TAPAZOLE) 5 MG tablet Take 5 mg by mouth daily.  . Multiple Vitamin (MULTIVITAMIN WITH MINERALS) TABS tablet Take 1 tablet by mouth  daily.  Marland Kitchen omeprazole (PRILOSEC) 20 MG capsule Take 1 capsule by mouth daily with breakfast.   . oxybutynin (DITROPAN-XL) 10 MG 24 hr tablet Take 10 mg by mouth daily with breakfast.  . OXYGEN Inhale 1 Inhaler See admin instructions into the lungs.  Marland Kitchen PARoxetine (PAXIL) 30 MG tablet Take 1 tablet by mouth daily.  Bertram Gala Glycol-Propyl Glycol (SYSTANE ULTRA) 0.4-0.3 % SOLN Place 1 drop into both eyes 2 (two) times daily.  . risperiDONE (RISPERDAL) 1 MG tablet Take 1-2 tablets by mouth 2 (two) times daily. Takes 0.5 in the morning and 2mg  at bedtime  . SODIUM FLUORIDE, DENTAL RINSE, (PREVIDENT) 0.2 % SOLN Use as directed 1 application in the mouth or throat 2 (two) times daily.   No facility-administered encounter medications on file as of 08/21/2017.      Review of Systems  Constitutional:   No  weight loss, night sweats,  Fevers, chills, fatigue, or  lassitude.  HEENT:   No headaches,  Difficulty swallowing,  Tooth/dental problems, or  Sore throat,                No sneezing, itching, ear ache, nasal congestion, post nasal drip,   CV:  No chest pain,  Orthopnea, PND, swelling in lower extremities, anasarca, dizziness, palpitations, syncope.   GI  No heartburn, indigestion, abdominal pain, nausea, vomiting, diarrhea, change in bowel habits, loss of appetite, bloody stools.   Resp: No shortness of breath with exertion or at rest.  No excess mucus, no productive cough,  No non-productive cough,  No coughing up of blood.  No change in color of mucus.  No wheezing.  No chest wall deformity  Skin: no rash or lesions.  GU: no dysuria, change in color of urine, no urgency or frequency.  No flank pain, no hematuria   MS:  No joint pain or swelling.  No decreased range of motion.  No back pain.    Physical Exam  BP 114/66 (BP Location: Left Arm, Cuff Size: Normal)   Pulse 87   Ht 5\' 2"  (1.575 m)   Wt 142 lb 9.6 oz (64.7 kg)   SpO2 91%   BMI 26.08 kg/m   GEN: A/Ox3; pleasant , NAD,  elderly , pleasant in wc    HEENT:  [place]/AT,  EACs-clear, TMs-wnl, NOSE-clear, THROAT-clear, no lesions, no postnasal drip or exudate noted.   NECK:  Supple w/ fair ROM; no JVD; normal carotid impulses w/o bruits; no thyromegaly or nodules palpated; no lymphadenopathy.    RESP  Clear  P & A; w/o, wheezes/ rales/ or rhonchi. no accessory muscle use, no dullness to percussion  CARD:  RRR, no m/r/g, tr-1   peripheral edema, pulses intact, no cyanosis or clubbing.  GI:   Soft & nt; nml bowel sounds; no organomegaly or masses detected.   Musco: Warm bil, no deformities or joint swelling noted.   Neuro: alert, no focal deficits noted.    Skin: Warm, no lesions or rashes  Lab Results:  CBC    Component Value Date/Time   WBC 4.2 05/22/2017 1145   RBC 4.58 05/22/2017 1145   HGB 14.8 05/22/2017 1145   HCT 44.4 05/22/2017 1145   PLT 305.0 05/22/2017 1145   MCV 96.9 05/22/2017 1145   MCH 32.9 06/22/2013 1538   MCHC 33.3 05/22/2017 1145   RDW 13.8 05/22/2017 1145   LYMPHSABS 1.2 05/22/2017 1145   MONOABS 0.6 05/22/2017 1145   EOSABS 0.1 05/22/2017 1145   BASOSABS 0.0 05/22/2017 1145    BMET    Component Value Date/Time   NA 142 05/22/2017 1145   K 3.7 05/22/2017 1145   CL 107 05/22/2017 1145   CO2 27 05/22/2017 1145   GLUCOSE 88 05/22/2017 1145   BUN 18 05/22/2017 1145   CREATININE 0.83 05/22/2017 1145   CALCIUM 9.1 05/22/2017 1145   GFRNONAA 87 (L) 06/22/2013 1538   GFRAA >90 06/22/2013 1538    BNP No results found for: BNP  ProBNP No results found for: PROBNP  Imaging: No results found.   Assessment & Plan:   Hypoxemia Chronic hypoxemia possibly due to diastolic dysfunction, elevated hemidiaphragm.  Patient is comfortable on oxygen.  She is to continue on O2 at 2 L.  Order for portable concentrator was sent to the home care company.     Rubye Oaks, NP 08/21/2017

## 2017-08-21 NOTE — Telephone Encounter (Signed)
Awesome, thank you [facility] Patient Care Coordination Note updated with accurate DME information

## 2017-08-21 NOTE — Assessment & Plan Note (Signed)
Chronic hypoxemia possibly due to diastolic dysfunction, elevated hemidiaphragm.  Patient is comfortable on oxygen.  She is to continue on O2 at 2 L.  Order for portable concentrator was sent to the home care company.

## 2017-08-21 NOTE — Progress Notes (Signed)
67 y/o F with PMH of HTN, home oxygen use due to hypoxemia, mental retardation and chronic diastolic dysfunction.  Presents today for qualifications for a portable oxygen concentrator (inogen).   Pt is wheelchair bound, wears oxygen 2L [place] 24/7.  Pt private caregiver Fabian Novemberatasha Green states that the inogen is compact, and can last for hours when out and about to appointments.  [name] also states that the current concentrator runs out oxygen quickly and beeps alot.  [name] notes  that pt has had no changes in her baseline pulmonary status.  Pt has had no complaints of SOB, chest pain or palpitations.      PE: Gen: Pleasant, in no distress Eyes: PERRLA  Neck: No JVD Lymphatics: No cervical lymphadenopathy Cardiovascular: regular rate and rhythm, 2+ edema to bilateral lower extremities Pulmonary: CTA, no wheezes or rales, pt oxygen saturation on RA today is 87% and it was 91% on 2L O2 [place].  Abdomen: Soft, bowel sounds normal Skin: warm and dry

## 2017-08-21 NOTE — Addendum Note (Signed)
Addended by: Boone MasterJONES, Lalo Tromp E on: 08/21/2017 05:24 PM   Modules accepted: Orders

## 2017-08-21 NOTE — Patient Instructions (Signed)
Continue on Oxygen 2l/m  Order for portable concentrator.  follow up Dr. Marchelle Gearingamaswamy in 6  Months and As needed

## 2017-08-21 NOTE — Telephone Encounter (Signed)
Devonia called back regarding patient, she was calling to let JJ know [facility]. Will route this to JJ.

## 2017-08-28 ENCOUNTER — Telehealth: Payer: Self-pay | Admitting: Adult Health

## 2017-08-28 NOTE — Telephone Encounter (Addendum)
Pt seen by TP on 2.5.19 and pt requalified for [employer] POC  Received fax from [employer] stating "Unfortunately, [employer] cannot service this patient through her Medicare.  The initial date on file with Medicare got oxygen is 09/25/14.  The patient has used all of her billable months except for 1.  Her Medicare benefits will rest on: 09/25/2019.  At that time, she can switch to a new provider and receive new equipment.  I am attaching a cash order if she would like to purchase a portable oxygen concentrator."  Called patient, spoke with her caregiver Amy Solis and relayed information from fax Amy Solis requested patient's POA Amy Solis be contacted with the information   Called spoke with Amy Solis and relayed the above information Scharlene Cornhom voiced his understanding and requested that the qualifying sats and information from [employer] be faxed to him at [phone]  This has been done [employer] fax sent for scan Nothing further needed; will sign off

## 2017-09-05 ENCOUNTER — Telehealth: Payer: Self-pay | Admitting: Internal Medicine

## 2017-09-05 NOTE — Telephone Encounter (Signed)
Attempted to call [name] regarding pt but unable to reach her.  Left message for [name] to return our call x1

## 2017-09-06 NOTE — Telephone Encounter (Signed)
Spoke with caregiver [name], expressed concerns with pt's POC battery only lasting a few hours, and pt having to return home to go on her home O2 tank.  I advised that typically pt's POC's will have either a 2nd battery or a portable charging system to keep pts more mobile.  [name] states she will check with pt to see if she has this, and if she doesn't will call back to have an order for a 2nd battery placed.  Nothing further needed at this time.

## 2017-09-06 NOTE — Telephone Encounter (Signed)
lmtcb for Amy Solis (caregiver) X2. Order [facility] 2[facility]5 [facility]

## 2017-09-06 NOTE — Telephone Encounter (Signed)
Pt caregiver Amy Solis is calling back [phone]

## 2017-09-12 ENCOUNTER — Other Ambulatory Visit: Payer: Self-pay | Admitting: Nurse Practitioner

## 2017-09-12 DIAGNOSIS — Z1231 Encounter for screening mammogram for malignant neoplasm of breast: Secondary | ICD-10-CM

## 2017-10-04 ENCOUNTER — Ambulatory Visit
Admission: RE | Admit: 2017-10-04 | Discharge: 2017-10-04 | Disposition: A | Discharge status: Home / Self Care | Payer: Medicare Other | Source: Ambulatory Visit | Attending: Nurse Practitioner | Admitting: Nurse Practitioner

## 2017-10-04 DIAGNOSIS — Z1231 Encounter for screening mammogram for malignant neoplasm of breast: Secondary | ICD-10-CM

## 2019-02-15 ENCOUNTER — Emergency Department (HOSPITAL_COMMUNITY): Payer: Medicare Other

## 2019-02-15 ENCOUNTER — Other Ambulatory Visit: Payer: Self-pay

## 2019-02-15 ENCOUNTER — Observation Stay (HOSPITAL_COMMUNITY)
Admission: EM | Admit: 2019-02-15 | Discharge: 2019-02-16 | Payer: Medicare Other | Attending: Family Medicine | Admitting: Family Medicine

## 2019-02-15 ENCOUNTER — Encounter (HOSPITAL_COMMUNITY): Payer: Self-pay | Admitting: Internal Medicine

## 2019-02-15 DIAGNOSIS — J8410 Pulmonary fibrosis, unspecified: Secondary | ICD-10-CM | POA: Insufficient documentation

## 2019-02-15 DIAGNOSIS — I110 Hypertensive heart disease with heart failure: Secondary | ICD-10-CM | POA: Insufficient documentation

## 2019-02-15 DIAGNOSIS — Z7982 Long term (current) use of aspirin: Secondary | ICD-10-CM | POA: Diagnosis not present

## 2019-02-15 DIAGNOSIS — J9611 Chronic respiratory failure with hypoxia: Secondary | ICD-10-CM | POA: Diagnosis not present

## 2019-02-15 DIAGNOSIS — Z8249 Family history of ischemic heart disease and other diseases of the circulatory system: Secondary | ICD-10-CM | POA: Diagnosis not present

## 2019-02-15 DIAGNOSIS — E876 Hypokalemia: Secondary | ICD-10-CM | POA: Diagnosis not present

## 2019-02-15 DIAGNOSIS — Z791 Long term (current) use of non-steroidal anti-inflammatories (NSAID): Secondary | ICD-10-CM | POA: Insufficient documentation

## 2019-02-15 DIAGNOSIS — F0390 Unspecified dementia without behavioral disturbance: Secondary | ICD-10-CM | POA: Insufficient documentation

## 2019-02-15 DIAGNOSIS — M810 Age-related osteoporosis without current pathological fracture: Secondary | ICD-10-CM | POA: Insufficient documentation

## 2019-02-15 DIAGNOSIS — T424X5A Adverse effect of benzodiazepines, initial encounter: Secondary | ICD-10-CM | POA: Diagnosis present

## 2019-02-15 DIAGNOSIS — I5030 Unspecified diastolic (congestive) heart failure: Secondary | ICD-10-CM | POA: Diagnosis not present

## 2019-02-15 DIAGNOSIS — I10 Essential (primary) hypertension: Secondary | ICD-10-CM | POA: Insufficient documentation

## 2019-02-15 DIAGNOSIS — E785 Hyperlipidemia, unspecified: Secondary | ICD-10-CM | POA: Insufficient documentation

## 2019-02-15 DIAGNOSIS — G931 Anoxic brain damage, not elsewhere classified: Principal | ICD-10-CM | POA: Insufficient documentation

## 2019-02-15 DIAGNOSIS — F70 Mild intellectual disabilities: Secondary | ICD-10-CM | POA: Insufficient documentation

## 2019-02-15 DIAGNOSIS — R0902 Hypoxemia: Secondary | ICD-10-CM | POA: Diagnosis not present

## 2019-02-15 DIAGNOSIS — Z1159 Encounter for screening for other viral diseases: Secondary | ICD-10-CM | POA: Diagnosis not present

## 2019-02-15 DIAGNOSIS — G9340 Encephalopathy, unspecified: Secondary | ICD-10-CM

## 2019-02-15 DIAGNOSIS — Z79899 Other long term (current) drug therapy: Secondary | ICD-10-CM | POA: Insufficient documentation

## 2019-02-15 DIAGNOSIS — E1140 Type 2 diabetes mellitus with diabetic neuropathy, unspecified: Secondary | ICD-10-CM | POA: Diagnosis not present

## 2019-02-15 DIAGNOSIS — Z993 Dependence on wheelchair: Secondary | ICD-10-CM | POA: Diagnosis not present

## 2019-02-15 DIAGNOSIS — I2510 Atherosclerotic heart disease of native coronary artery without angina pectoris: Secondary | ICD-10-CM | POA: Diagnosis present

## 2019-02-15 DIAGNOSIS — R4182 Altered mental status, unspecified: Secondary | ICD-10-CM | POA: Diagnosis not present

## 2019-02-15 DIAGNOSIS — G4734 Idiopathic sleep related nonobstructive alveolar hypoventilation: Secondary | ICD-10-CM | POA: Diagnosis present

## 2019-02-15 DIAGNOSIS — F329 Major depressive disorder, single episode, unspecified: Secondary | ICD-10-CM | POA: Insufficient documentation

## 2019-02-15 DIAGNOSIS — E0590 Thyrotoxicosis, unspecified without thyrotoxic crisis or storm: Secondary | ICD-10-CM | POA: Diagnosis not present

## 2019-02-15 DIAGNOSIS — I5189 Other ill-defined heart diseases: Secondary | ICD-10-CM

## 2019-02-15 DIAGNOSIS — I2584 Coronary atherosclerosis due to calcified coronary lesion: Secondary | ICD-10-CM | POA: Diagnosis present

## 2019-02-15 DIAGNOSIS — J9601 Acute respiratory failure with hypoxia: Secondary | ICD-10-CM | POA: Insufficient documentation

## 2019-02-15 LAB — CBC WITH DIFFERENTIAL/PLATELET
Abs Immature Granulocytes: 0.02 10*3/uL (ref 0.00–0.07)
Basophils Absolute: 0 10*3/uL (ref 0.0–0.1)
Basophils Relative: 0 %
Eosinophils Absolute: 0 10*3/uL (ref 0.0–0.5)
Eosinophils Relative: 0 %
HCT: 41.3 % (ref 36.0–46.0)
Hemoglobin: 13.4 g/dL (ref 12.0–15.0)
Immature Granulocytes: 0 %
Lymphocytes Relative: 7 %
Lymphs Abs: 0.5 10*3/uL — ABNORMAL LOW (ref 0.7–4.0)
MCH: 32 pg (ref 26.0–34.0)
MCHC: 32.4 g/dL (ref 30.0–36.0)
MCV: 98.6 fL (ref 80.0–100.0)
Monocytes Absolute: 0.5 10*3/uL (ref 0.1–1.0)
Monocytes Relative: 6 %
Neutro Abs: 6.1 10*3/uL (ref 1.7–7.7)
Neutrophils Relative %: 87 %
Platelets: 265 10*3/uL (ref 150–400)
RBC: 4.19 MIL/uL (ref 3.87–5.11)
RDW: 13.1 % (ref 11.5–15.5)
WBC: 7.1 10*3/uL (ref 4.0–10.5)
nRBC: 0 % (ref 0.0–0.2)

## 2019-02-15 LAB — POCT I-STAT 7, (LYTES, BLD GAS, ICA,H+H)
Acid-Base Excess: 2 mmol/L (ref 0.0–2.0)
Acid-base deficit: 1 mmol/L (ref 0.0–2.0)
Bicarbonate: 25.6 mmol/L (ref 20.0–28.0)
Bicarbonate: 28.8 mmol/L — ABNORMAL HIGH (ref 20.0–28.0)
Calcium, Ion: 1.27 mmol/L (ref 1.15–1.40)
Calcium, Ion: 1.28 mmol/L (ref 1.15–1.40)
HCT: 39 % (ref 36.0–46.0)
HCT: 38 % (ref 36.0–46.0)
Hemoglobin: 13.3 g/dL (ref 12.0–15.0)
Hemoglobin: 12.9 g/dL (ref 12.0–15.0)
O2 Saturation: 98 %
O2 Saturation: 99 %
Patient temperature: 97.5
Patient temperature: 97.5
Potassium: 4.1 mmol/L (ref 3.5–5.1)
Potassium: 4.2 mmol/L (ref 3.5–5.1)
Sodium: 140 mmol/L (ref 135–145)
Sodium: 140 mmol/L (ref 135–145)
TCO2: 27 mmol/L (ref 22–32)
TCO2: 30 mmol/L (ref 22–32)
pCO2 arterial: 45.9 mmHg (ref 32.0–48.0)
pCO2 arterial: 51.2 mmHg — ABNORMAL HIGH (ref 32.0–48.0)
pH, Arterial: 7.351 (ref 7.350–7.450)
pH, Arterial: 7.355 (ref 7.350–7.450)
pO2, Arterial: 106 mmHg (ref 83.0–108.0)
pO2, Arterial: 127 mmHg — ABNORMAL HIGH (ref 83.0–108.0)

## 2019-02-15 LAB — URINALYSIS, COMPLETE (UACMP) WITH MICROSCOPIC
Glucose, UA: NEGATIVE mg/dL
Hgb urine dipstick: NEGATIVE
Ketones, ur: 5 mg/dL — AB
Leukocytes,Ua: NEGATIVE
Nitrite: NEGATIVE
Protein, ur: 100 mg/dL — AB
Specific Gravity, Urine: 1.032 — ABNORMAL HIGH (ref 1.005–1.030)
pH: 5 (ref 5.0–8.0)

## 2019-02-15 LAB — COMPREHENSIVE METABOLIC PANEL
ALT: 18 U/L (ref 0–44)
AST: 28 U/L (ref 15–41)
Albumin: 3.2 g/dL — ABNORMAL LOW (ref 3.5–5.0)
Alkaline Phosphatase: 102 U/L (ref 38–126)
Anion gap: 10 (ref 5–15)
BUN: 21 mg/dL (ref 8–23)
CO2: 21 mmol/L — ABNORMAL LOW (ref 22–32)
Calcium: 8.6 mg/dL — ABNORMAL LOW (ref 8.9–10.3)
Chloride: 109 mmol/L (ref 98–111)
Creatinine, Ser: 1 mg/dL (ref 0.44–1.00)
GFR calc Af Amer: >60 mL/min (ref >60)
GFR calc non Af Amer: 57 mL/min — ABNORMAL LOW (ref >60)
Glucose, Bld: 100 mg/dL — ABNORMAL HIGH (ref 70–99)
Potassium: 4.8 mmol/L (ref 3.5–5.1)
Sodium: 140 mmol/L (ref 135–145)
Total Bilirubin: 1.1 mg/dL (ref 0.3–1.2)
Total Protein: 5.8 g/dL — ABNORMAL LOW (ref 6.5–8.1)

## 2019-02-15 LAB — POCT I-STAT EG7
Acid-base deficit: 2 mmol/L (ref 0.0–2.0)
Bicarbonate: 25.1 mmol/L (ref 20.0–28.0)
Calcium, Ion: 1.17 mmol/L (ref 1.15–1.40)
HCT: 40 % (ref 36.0–46.0)
Hemoglobin: 13.6 g/dL (ref 12.0–15.0)
O2 Saturation: 88 %
Potassium: 4.2 mmol/L (ref 3.5–5.1)
Sodium: 141 mmol/L (ref 135–145)
TCO2: 27 mmol/L (ref 22–32)
pCO2, Ven: 49.3 mmHg (ref 44.0–60.0)
pH, Ven: 7.315 (ref 7.250–7.430)
pO2, Ven: 59 mmHg — ABNORMAL HIGH (ref 32.0–45.0)

## 2019-02-15 LAB — LACTIC ACID, PLASMA: Lactic Acid, Venous: 1.5 mmol/L (ref 0.5–1.9)

## 2019-02-15 LAB — RAPID URINE DRUG SCREEN, HOSP PERFORMED
Amphetamines: NOT DETECTED
Barbiturates: NOT DETECTED
Benzodiazepines: NOT DETECTED
Cocaine: NOT DETECTED
Opiates: NOT DETECTED
Tetrahydrocannabinol: NOT DETECTED

## 2019-02-15 LAB — CK: Total CK: 151 U/L (ref 38–234)

## 2019-02-15 LAB — CBG MONITORING, ED: Glucose-Capillary: 94 mg/dL (ref 70–99)

## 2019-02-15 LAB — SARS CORONAVIRUS 2 BY RT PCR (HOSPITAL ORDER, PERFORMED IN ~~LOC~~ HOSPITAL LAB): SARS Coronavirus 2: NEGATIVE

## 2019-02-15 LAB — ETHANOL: Alcohol, Ethyl (B): <10 mg/dL (ref <10)

## 2019-02-15 LAB — AMMONIA: Ammonia: 14 umol/L (ref 9–35)

## 2019-02-15 LAB — MRSA PCR SCREENING: MRSA by PCR: NEGATIVE

## 2019-02-15 MED ORDER — GABAPENTIN 800 MG PO TABS
800.0000 mg | ORAL_TABLET | Freq: Every day | ORAL | Status: DC
  Filled 2019-02-15: qty 1

## 2019-02-15 MED ORDER — ACETAMINOPHEN 650 MG RE SUPP: 650.0000 mg | Freq: Four times a day (QID) | RECTAL | Status: DC | PRN

## 2019-02-15 MED ORDER — SODIUM CHLORIDE 0.9% FLUSH: 3.0000 mL | INTRAVENOUS | Status: DC | PRN

## 2019-02-15 MED ORDER — THIAMINE HCL 100 MG/ML IJ SOLN
100.0000 mg | Freq: Once | INTRAMUSCULAR | Status: AC
  Administered 2019-02-15: 100 mg via INTRAVENOUS
  Filled 2019-02-15: qty 2

## 2019-02-15 MED ORDER — AMMONIUM LACTATE 12 % EX LOTN
1.0000 "application " | TOPICAL_LOTION | Freq: Two times a day (BID) | CUTANEOUS | Status: DC
  Administered 2019-02-16: 1 via TOPICAL
  Filled 2019-02-15: qty 225

## 2019-02-15 MED ORDER — OXYBUTYNIN CHLORIDE ER 5 MG PO TB24
10.0000 mg | ORAL_TABLET | Freq: Every day | ORAL | Status: DC
  Administered 2019-02-16: 10 mg via ORAL
  Filled 2019-02-15: qty 2

## 2019-02-15 MED ORDER — ACETAMINOPHEN 325 MG PO TABS: 650.0000 mg | ORAL_TABLET | Freq: Four times a day (QID) | ORAL | Status: DC | PRN

## 2019-02-15 MED ORDER — FLUTICASONE PROPIONATE 50 MCG/ACT NA SUSP
2.0000 | Freq: Every day | NASAL | Status: DC
  Administered 2019-02-16: 2 via NASAL
  Filled 2019-02-15: qty 16

## 2019-02-15 MED ORDER — GABAPENTIN 400 MG PO CAPS
800.0000 mg | ORAL_CAPSULE | Freq: Every day | ORAL | Status: DC
  Filled 2019-02-15: qty 2

## 2019-02-15 MED ORDER — PANTOPRAZOLE SODIUM 40 MG PO TBEC
40.0000 mg | DELAYED_RELEASE_TABLET | Freq: Every day | ORAL | Status: DC
  Administered 2019-02-16: 40 mg via ORAL
  Filled 2019-02-15: qty 1

## 2019-02-15 MED ORDER — METHIMAZOLE 5 MG PO TABS
5.0000 mg | ORAL_TABLET | Freq: Every day | ORAL | Status: DC
  Administered 2019-02-16: 5 mg via ORAL
  Filled 2019-02-15: qty 1

## 2019-02-15 MED ORDER — RISPERIDONE 0.5 MG PO TABS
0.5000 mg | ORAL_TABLET | Freq: Two times a day (BID) | ORAL | Status: DC
  Administered 2019-02-16: 0.5 mg via ORAL
  Filled 2019-02-15 (×3): qty 1

## 2019-02-15 MED ORDER — BENAZEPRIL HCL 5 MG PO TABS
20.0000 mg | ORAL_TABLET | Freq: Every day | ORAL | Status: DC
  Administered 2019-02-16: 20 mg via ORAL
  Filled 2019-02-15: qty 4

## 2019-02-15 MED ORDER — SODIUM CHLORIDE 0.9% FLUSH
3.0000 mL | Freq: Two times a day (BID) | INTRAVENOUS | Status: DC
  Administered 2019-02-15 – 2019-02-16 (×2): 3 mL via INTRAVENOUS

## 2019-02-15 MED ORDER — POLYVINYL ALCOHOL 1.4 % OP SOLN
1.0000 [drp] | Freq: Two times a day (BID) | OPHTHALMIC | Status: DC
  Administered 2019-02-15 – 2019-02-16 (×2): 1 [drp] via OPHTHALMIC
  Filled 2019-02-15 (×2): qty 15

## 2019-02-15 MED ORDER — SODIUM CHLORIDE 0.9 % IV SOLN: 250.0000 mL | INTRAVENOUS | Status: DC | PRN

## 2019-02-15 MED ORDER — AMLODIPINE BESY-BENAZEPRIL HCL 5-20 MG PO CAPS: 1.0000 | ORAL_CAPSULE | Freq: Every day | ORAL | Status: DC

## 2019-02-15 MED ORDER — AMLODIPINE BESYLATE 5 MG PO TABS
5.0000 mg | ORAL_TABLET | Freq: Every day | ORAL | Status: DC
  Administered 2019-02-16: 5 mg via ORAL
  Filled 2019-02-15: qty 1

## 2019-02-15 MED ORDER — DEXTROMETHORPHAN-QUINIDINE 20-10 MG PO CAPS: 1.0000 | ORAL_CAPSULE | Freq: Two times a day (BID) | ORAL | Status: DC

## 2019-02-15 MED ORDER — ASPIRIN 81 MG PO CHEW
81.0000 mg | CHEWABLE_TABLET | Freq: Every day | ORAL | Status: DC
  Administered 2019-02-16: 81 mg via ORAL
  Filled 2019-02-15: qty 1

## 2019-02-15 MED ORDER — ADULT MULTIVITAMIN W/MINERALS CH
1.0000 | ORAL_TABLET | Freq: Every day | ORAL | Status: DC
  Administered 2019-02-16: 1 via ORAL
  Filled 2019-02-15: qty 1

## 2019-02-15 MED ORDER — POLYETHYLENE GLYCOL 3350 17 G PO PACK: 17.0000 g | PACK | Freq: Every day | ORAL | Status: DC | PRN

## 2019-02-15 MED ORDER — MELOXICAM 7.5 MG PO TABS
15.0000 mg | ORAL_TABLET | Freq: Every day | ORAL | Status: DC
  Administered 2019-02-16: 15 mg via ORAL
  Filled 2019-02-15: qty 2

## 2019-02-15 MED ORDER — ONDANSETRON HCL 4 MG PO TABS: 4.0000 mg | ORAL_TABLET | Freq: Four times a day (QID) | ORAL | Status: DC | PRN

## 2019-02-15 MED ORDER — PAROXETINE HCL 20 MG PO TABS
40.0000 mg | ORAL_TABLET | Freq: Every day | ORAL | Status: DC
  Administered 2019-02-16: 40 mg via ORAL
  Filled 2019-02-15: qty 2

## 2019-02-15 MED ORDER — ONDANSETRON HCL 4 MG/2ML IJ SOLN: 4.0000 mg | Freq: Four times a day (QID) | INTRAMUSCULAR | Status: DC | PRN

## 2019-02-15 MED ORDER — SODIUM CHLORIDE (HYPERTONIC) 5 % OP SOLN: 1.0000 [drp] | Freq: Three times a day (TID) | OPHTHALMIC | Status: DC

## 2019-02-15 MED ORDER — POTASSIUM CHLORIDE IN NACL 20-0.9 MEQ/L-% IV SOLN
INTRAVENOUS | Status: DC
  Administered 2019-02-15 – 2019-02-16 (×2): via INTRAVENOUS
  Filled 2019-02-15 (×2): qty 1000

## 2019-02-15 MED ORDER — ENOXAPARIN SODIUM 40 MG/0.4ML ~~LOC~~ SOLN
40.0000 mg | SUBCUTANEOUS | Status: DC
  Administered 2019-02-15: 40 mg via SUBCUTANEOUS
  Filled 2019-02-15 (×2): qty 0.4

## 2019-02-15 MED ORDER — ATORVASTATIN CALCIUM 10 MG PO TABS
10.0000 mg | ORAL_TABLET | Freq: Every day | ORAL | Status: DC
  Administered 2019-02-16: 10 mg via ORAL
  Filled 2019-02-15: qty 1

## 2019-02-15 MED ORDER — OXYCODONE HCL 5 MG PO TABS: 5.0000 mg | ORAL_TABLET | ORAL | Status: DC | PRN

## 2019-02-15 MED ORDER — DEXTROMETHORPHAN-QUINIDINE 20-10 MG PO CAPS
1.0000 | ORAL_CAPSULE | Freq: Two times a day (BID) | ORAL | Status: DC
  Administered 2019-02-16: 1 via ORAL
  Filled 2019-02-15 (×3): qty 1

## 2019-02-15 NOTE — Progress Notes (Signed)
02/15/2019 Patient came from the emergency room to [facility]West at 1648. She is alert to self, but is arousable. Patient have scattered bruises, abrastion and excoriation on left breast. Scab on right lower leg and behind right ear. Patient was placed on telemetry and bed alarm was turn on for patient safety Amy Sheehan RN

## 2019-02-15 NOTE — ED Provider Notes (Signed)
The patient is a 68 year old female, presents with altered mental status, level 5 caveat applies as the patient is not able to give [place] any information.  Reportedly the patient is not her normal self this morning with increased somnolence and obtundation.  On exam the patient is severely obtunded though to painful stimuli and loud voice she is able to open her eyes.  She will not answer my questions or follow commands otherwise  Lungs and heart exam are unremarkable, abdomen is soft, the patient will need further evaluation including looking for hypercapnia, metabolic abnormalities, stroke etc.  This patient was critically ill with a persistent encephalopathy that had no significant improvement, she required multiple neurologic exams, repeat examinations, cardiac monitoring, respiratory monitoring, pulse oximetry and multiple evaluations including labs and imaging which revealed no definite etiology.  This could be multifactorial including polypharmacy etc.  Critical care provided for this patient with persistent significant dense obtundation  CRITICAL CARE Performed by: Johnna Acosta Total critical care time: 35 minutes Critical care time was exclusive of separately billable procedures and treating other patients. Critical care was necessary to treat or prevent imminent or life-threatening deterioration. Critical care was time spent personally by me on the following activities: development of treatment plan with patient and/or surrogate as well as nursing, discussions with consultants, evaluation of patient's response to treatment, examination of patient, obtaining history from patient or surrogate, ordering and performing treatments and interventions, ordering and review of laboratory studies, ordering and review of radiographic studies, pulse oximetry and re-evaluation of patient's condition.   Medical screening examination/treatment/procedure(s) were conducted as a shared visit with  non-physician practitioner(s) and myself.  I personally evaluated the patient during the encounter.  Clinical Impression:   Final diagnoses:  Encephalopathy         Noemi Chapel, MD 02/18/19 2056

## 2019-02-15 NOTE — ED Notes (Signed)
ED TO INPATIENT HANDOFF REPORT  ED Nurse Name and Phone #: Lanora Manislizabeth [phone]  S Name/Age/Gender Amy RossettiSusan F Solis 68 y.o. female Room/Bed: 019C/019C  Code Status   Code Status: Full Code  Home/SNF/Other [facility]  Patient oriented to: self Is this baseline? No   Triage Complete: Triage complete  Chief Complaint AMS  Triage Note Per [facility], pt [facility] on [address] due to altered mental status/drowsiness that was discovered by staff this morning. Unknown lsn. Pt just started taking clonopin 2 days ago. Also takes risperdal. VSS. Axo to person and place only. Following commands but very lethargic. Pt was mid 80s on RA, supposed to wear 2L at all times but is noncompliant. Placed on 4L and sat at 98%.    Allergies No Known Allergies  Level of Care/Admitting Diagnosis ED Disposition    ED Disposition Condition Comment   Admit  [facility] [[id number]]  Level of Care: Telemetry Medical [104]  I expect the patient will be discharged within 24 hours: Yes  LOW acuity---Tx typically complete <24 hrs---ACUTE conditions typically can be evaluated <24 hours---LABS likely to return to acceptable levels <24 hours---IS near functional baseline---EXPECTED to return to current living arrangement---NOT newly hypoxic: Meets criteria for 5C-Observation unit  Covid Evaluation: Confirmed COVID Negative  Diagnosis: Hypoxic encephalopathy [facility](HCC) [[id number]]  Admitting Physician: Lahoma CrockerSHEEHAN, THERESA C [[id number]]  Attending Physician: Lahoma CrockerSHEEHAN, THERESA C [[id number]]  PT Class (Do Not Modify): Observation [104]  PT Acc Code (Do Not Modify): Observation [10022]       B Medical/Surgery History Past Medical History:  Diagnosis Date  . Depressive disorder   . Hyperlipemia   . Hypertension   . Mild mental retardation   . Osteoporosis    Past Surgical History:  Procedure Laterality Date  . ABDOMINAL HYSTERECTOMY       A IV  Location/Drains/Wounds Patient Lines/Drains/Airways Status   Active Line/Drains/Airways    Name:   Placement date:   Placement time:   Site:   Days:   Peripheral IV 02/15/19 Right Forearm   02/15/19    1120    Forearm   less than 1   Peripheral IV 02/15/19 Left Hand   02/15/19    1120    Hand   less than 1   External Urinary Catheter   02/15/19    1100    -   less than 1          Intake/Output Last 24 hours No intake or output data in the 24 hours ending 02/15/19 1557  Labs/Imaging Results for orders placed or performed during the hospital encounter of 02/15/19 (from the past 48 hour(s))  Ammonia     Status: None   Collection Time: 02/15/19 10:15 AM  Result Value Ref Range   Ammonia 14 9 - 35 umol/L    Comment: Performed at [facility], [address] [place], [place] [address]  Lactic acid, plasma     Status: None   Collection Time: 02/15/19 10:15 AM  Result Value Ref Range   Lactic Acid, Venous 1.5 0.5 - 1.9 mmol/L    Comment: Performed at [facility], [address] [place], [place] [address]  CBG monitoring, ED     Status: None   Collection Time: 02/15/19 10:16 AM  Result Value Ref Range   Glucose-Capillary 94 70 - 99 mg/dL  SARS Coronavirus 2 [facility](Hospital order, Performed in [facility] hospital lab) Nasopharyngeal Nasopharyngeal  Swab     Status: None   Collection Time: 02/15/19 10:24 AM   Specimen: Nasopharyngeal Swab  Result Value Ref Range   SARS Coronavirus 2 NEGATIVE NEGATIVE    Comment: (NOTE) If result is NEGATIVE SARS-CoV-2 target nucleic acids are NOT DETECTED. The SARS-CoV-2 RNA is generally detectable in upper and lower  respiratory specimens during the acute phase of infection. The lowest  concentration of SARS-CoV-2 viral copies this assay can detect is 250  copies / mL. A negative result does not preclude SARS-CoV-2 infection  and should not be used as the sole basis for treatment or other  patient management decisions.  A negative result  may occur with  improper specimen collection / handling, submission of specimen other  than nasopharyngeal swab, presence of viral mutation(s) within the  areas targeted by this assay, and inadequate number of viral copies  (<250 copies / mL). A negative result must be combined with clinical  observations, patient history, and epidemiological information. If result is POSITIVE SARS-CoV-2 target nucleic acids are DETECTED. The SARS-CoV-2 RNA is generally detectable in upper and lower  respiratory specimens dur ing the acute phase of infection.  Positive  results are indicative of active infection with SARS-CoV-2.  Clinical  correlation with patient history and other diagnostic information is  necessary to determine patient infection status.  Positive results do  not rule out bacterial infection or co-infection with other viruses. If result is PRESUMPTIVE POSTIVE SARS-CoV-2 nucleic acids MAY BE PRESENT.   A presumptive positive result was obtained on the submitted specimen  and confirmed on repeat testing.  While 2019 novel coronavirus  (SARS-CoV-2) nucleic acids may be present in the submitted sample  additional confirmatory testing may be necessary for epidemiological  and / or clinical management purposes  to differentiate between  SARS-CoV-2 and other Sarbecovirus currently known to infect humans.  If clinically indicated additional testing with an alternate test  methodology [phone]) is advised. The SARS-CoV-2 RNA is generally  detectable in upper and lower respiratory sp ecimens during the acute  phase of infection. The expected result is Negative. Fact Sheet for Patients:  StrictlyIdeas.no Fact Sheet for Healthcare Providers: BankingDealers.co.za This test is not yet approved or cleared by the [place] FDA and has been authorized for detection and/or diagnosis of SARS-CoV-2 by FDA under an Emergency Use Authorization (EUA).   This EUA will remain in effect (meaning this test can be used) for the duration of the COVID-19 declaration under Section 564(b)(1) of the Act, 21 U.S.C. section 360bbb-3(b)(1), unless the authorization is terminated or revoked sooner. Performed at [facility], [place] [address] [place], [place] [address]   Comprehensive metabolic panel     Status: Abnormal   Collection Time: 02/15/19 10:31 AM  Result Value Ref Range   Sodium 140 135 - 145 mmol/L   Potassium 4.8 3.5 - 5.1 mmol/L    Comment: SLIGHT HEMOLYSIS   Chloride 109 98 - 111 mmol/L   CO2 21 (L) 22 - 32 mmol/L   Glucose, Bld 100 (H) 70 - 99 mg/dL   BUN 21 8 - 23 mg/dL   Creatinine, Ser 1.00 0.44 - 1.00 mg/dL   Calcium 8.6 (L) 8.9 - 10.3 mg/dL   Total Protein 5.8 (L) 6.5 - 8.1 g/dL   Albumin 3.2 (L) 3.5 - 5.0 g/dL   AST 28 15 - 41 U/L   ALT 18 0 - 44 U/L   Alkaline Phosphatase 102 38 - 126 U/L  Total Bilirubin 1.1 0.3 - 1.2 mg/dL   GFR calc non Af Amer 57 (L) >60 mL/min   GFR calc Af Amer >60 >60 mL/min   Anion gap 10 5 - 15    Comment: Performed at [facility], [address] [place], [place] [address]  CBC WITH DIFFERENTIAL     Status: Abnormal   Collection Time: 02/15/19 10:31 AM  Result Value Ref Range   WBC 7.1 4.0 - 10.5 K/uL   RBC 4.19 3.87 - 5.11 MIL/uL   Hemoglobin 13.4 12.0 - 15.0 g/dL   HCT 19.141.3 47.836.0 - 29.546.0 %   MCV 98.6 80.0 - 100.0 fL   MCH 32.0 26.0 - 34.0 pg   MCHC 32.4 30.0 - 36.0 g/dL   RDW 62.113.1 30.811.5 - 65.715.5 %   Platelets 265 150 - 400 K/uL   nRBC 0.0 0.0 - 0.2 %   Neutrophils Relative % 87 %   Neutro Abs 6.1 1.7 - 7.7 K/uL   Lymphocytes Relative 7 %   Lymphs Abs 0.5 (L) 0.7 - 4.0 K/uL   Monocytes Relative 6 %   Monocytes Absolute 0.5 0.1 - 1.0 K/uL   Eosinophils Relative 0 %   Eosinophils Absolute 0.0 0.0 - 0.5 K/uL   Basophils Relative 0 %   Basophils Absolute 0.0 0.0 - 0.1 K/uL   Immature Granulocytes 0 %   Abs Immature Granulocytes 0.02 0.00 - 0.07 K/uL    Comment: Performed  at [facility], [address] [place], [place] [address]  Urinalysis, Complete w Microscopic     Status: Abnormal   Collection Time: 02/15/19 10:31 AM  Result Value Ref Range   Color, Urine AMBER (A) YELLOW    Comment: BIOCHEMICALS MAY BE AFFECTED BY COLOR   APPearance CLOUDY (A) CLEAR   Specific Gravity, Urine 1.032 (H) 1.005 - 1.030   pH 5.0 5.0 - 8.0   Glucose, UA NEGATIVE NEGATIVE mg/dL   Hgb urine dipstick NEGATIVE NEGATIVE   Bilirubin Urine SMALL (A) NEGATIVE   Ketones, ur 5 (A) NEGATIVE mg/dL   Protein, ur 295100 (A) NEGATIVE mg/dL   Nitrite NEGATIVE NEGATIVE   Leukocytes,Ua NEGATIVE NEGATIVE   RBC / HPF 11-20 0 - 5 RBC/hpf   WBC, UA 6-10 0 - 5 WBC/hpf   Bacteria, UA FEW (A) NONE SEEN   Mucus PRESENT    Hyaline Casts, UA PRESENT     Comment: Performed at [facility], [address] [place], [place] [address]  Ethanol     Status: None   Collection Time: 02/15/19 10:31 AM  Result Value Ref Range   Alcohol, Ethyl (B) <10 <10 mg/dL    Comment: (NOTE) Lowest detectable limit for serum alcohol is 10 mg/dL. For medical purposes only. Performed at [facility], [address] [place], [place] [address]   Urine rapid drug screen (hosp performed)     Status: None   Collection Time: 02/15/19 10:31 AM  Result Value Ref Range   Opiates NONE DETECTED NONE DETECTED   Cocaine NONE DETECTED NONE DETECTED   Benzodiazepines NONE DETECTED NONE DETECTED   Amphetamines NONE DETECTED NONE DETECTED   Tetrahydrocannabinol NONE DETECTED NONE DETECTED   Barbiturates NONE DETECTED NONE DETECTED    Comment: (NOTE) DRUG SCREEN FOR MEDICAL PURPOSES ONLY.  IF CONFIRMATION IS NEEDED FOR ANY PURPOSE, NOTIFY LAB WITHIN 5 DAYS. LOWEST DETECTABLE LIMITS FOR URINE DRUG SCREEN Drug Class  Cutoff (ng/mL) Amphetamine and metabolites    1000 Barbiturate and metabolites    200 Benzodiazepine                 200 Tricyclics and metabolites     300 Opiates and  metabolites        300 Cocaine and metabolites        300 THC                            50 Performed at [facility], [address] [place], [place] [address]   CK     Status: None   Collection Time: 02/15/19 10:31 AM  Result Value Ref Range   Total CK 151 38 - 234 U/L    Comment: Performed at [facility], [address] [place], [place] [address]  POCT I-Stat EG7     Status: Abnormal   Collection Time: 02/15/19 10:59 AM  Result Value Ref Range   pH, Ven 7.315 7.250 - 7.430   pCO2, Ven 49.3 44.0 - 60.0 mmHg   pO2, Ven 59.0 (H) 32.0 - 45.0 mmHg   Bicarbonate 25.1 20.0 - 28.0 mmol/L   TCO2 27 22 - 32 mmol/L   O2 Saturation 88.0 %   Acid-base deficit 2.0 0.0 - 2.0 mmol/L   Sodium 141 135 - 145 mmol/L   Potassium 4.2 3.5 - 5.1 mmol/L   Calcium, Ion 1.17 1.15 - 1.40 mmol/L   HCT 40.0 36.0 - 46.0 %   Hemoglobin 13.6 12.0 - 15.0 g/dL   Patient temperature HIDE    Sample type VENOUS   I-STAT 7, (LYTES, BLD GAS, ICA, H+H)     Status: None   Collection Time: 02/15/19 11:24 AM  Result Value Ref Range   pH, Arterial 7.351 7.350 - 7.450   pCO2 arterial 45.9 32.0 - 48.0 mmHg   pO2, Arterial 106.0 83.0 - 108.0 mmHg   Bicarbonate 25.6 20.0 - 28.0 mmol/L   TCO2 27 22 - 32 mmol/L   O2 Saturation 98.0 %   Acid-base deficit 1.0 0.0 - 2.0 mmol/L   Sodium 140 135 - 145 mmol/L   Potassium 4.1 3.5 - 5.1 mmol/L   Calcium, Ion 1.27 1.15 - 1.40 mmol/L   HCT 39.0 36.0 - 46.0 %   Hemoglobin 13.3 12.0 - 15.0 g/dL   Patient temperature 82.9 F    Collection site RADIAL, ALLEN'S TEST ACCEPTABLE    Drawn by Operator    Sample type ARTERIAL   I-STAT 7, (LYTES, BLD GAS, ICA, H+H)     Status: Abnormal   Collection Time: 02/15/19  3:19 PM  Result Value Ref Range   pH, Arterial 7.355 7.350 - 7.450   pCO2 arterial 51.2 (H) 32.0 - 48.0 mmHg   pO2, Arterial 127.0 (H) 83.0 - 108.0 mmHg   Bicarbonate 28.8 (H) 20.0 - 28.0 mmol/L   TCO2 30 22 - 32 mmol/L   O2 Saturation 99.0 %   Acid-Base  Excess 2.0 0.0 - 2.0 mmol/L   Sodium 140 135 - 145 mmol/L   Potassium 4.2 3.5 - 5.1 mmol/L   Calcium, Ion 1.28 1.15 - 1.40 mmol/L   HCT 38.0 36.0 - 46.0 %   Hemoglobin 12.9 12.0 - 15.0 g/dL   Patient temperature 56.2 F    Collection site RADIAL, ALLEN'S TEST ACCEPTABLE    Drawn by Operator    Sample type ARTERIAL    Dg Chest  1 View  Result Date: 02/15/2019 CLINICAL DATA:  Altered mental status. EXAM: CHEST  1 VIEW COMPARISON:  May 22, 2017 FINDINGS: The mediastinal contour is normal. The heart size is normal. The lung volumes are low. There is chronic elevation of right hemidiaphragm. No focal pneumonia, pulmonary edema or pleural effusion is identified. The bony structures are stable. IMPRESSION: Low lung volumes.  No acute cardiopulmonary disease identified. Electronically Signed   By: Sherian ReinWei-Chen  Lin M.D.   On: 02/15/2019 11:18   Ct Head Wo Contrast  Result Date: 02/15/2019 CLINICAL DATA:  Altered mental status EXAM: CT HEAD WITHOUT CONTRAST TECHNIQUE: Contiguous axial images were obtained from the base of the skull through the vertex without intravenous contrast. COMPARISON:  June 16, 2013 FINDINGS: Brain: No evidence of acute infarction, hemorrhage, hydrocephalus, extra-axial collection or mass lesion/mass effect. 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 Vascular: No hyperdense vessel is noted. Skull: Normal. Negative for fracture or focal lesion. Sinuses/Orbits: No acute finding. Other: None IMPRESSION: No focal acute intracranial abnormality identified. Electronically Signed   By: Sherian ReinWei-Chen  Lin M.D.   On: 02/15/2019 12:38    Pending Labs Unresulted Labs (From admission, onward)    Start     Ordered   02/22/19 0500  Creatinine, serum  (enoxaparin (LOVENOX)    CrCl >/= 30 ml/min)  Weekly,   R    Comments: while on enoxaparin therapy    02/15/19 1425   02/16/19 0500  Basic metabolic panel  Tomorrow morning,   R      02/15/19 1425   02/16/19 0500  CBC  Tomorrow morning,   R     02/15/19 1425   02/15/19 1425  Blood gas, arterial  ONCE - STAT,   R     02/15/19 1425   02/15/19 1422  HIV antibody (Routine Testing)  Once,   STAT     02/15/19 1425   02/15/19 1016  Blood Cultures (routine x 2)  BLOOD CULTURE X 2,   STAT     02/15/19 1020          Vitals/Pain Today's Vitals   02/15/19 1230 02/15/19 1300 02/15/19 1330 02/15/19 1400  BP: 114/72 107/71 105/69 (!) 147/84  Pulse: 71 73 72 64  Resp: 14 15 15 17   Temp:      TempSrc:      SpO2: 98% 98% 97% 100%  PainSc:        Isolation Precautions No active isolations  Medications Medications  enoxaparin (LOVENOX) injection 40 mg (has no administration in time range)  0.9 % NaCl with KCl 20 mEq/ L  infusion ( Intravenous New Bag/Given 02/15/19 1442)  sodium chloride flush (NS) 0.9 % injection 3 mL (3 mLs Intravenous Not Given 02/15/19 1438)  sodium chloride flush (NS) 0.9 % injection 3 mL (has no administration in time range)  0.9 %  sodium chloride infusion (has no administration in time range)  acetaminophen (TYLENOL) tablet 650 mg (has no administration in time range)    Or  acetaminophen (TYLENOL) suppository 650 mg (has no administration in time range)  oxyCODONE (Oxy IR/ROXICODONE) immediate release tablet 5 mg (has no administration in time range)  polyethylene glycol (MIRALAX / GLYCOLAX) packet 17 g (has no administration in time range)  ondansetron (ZOFRAN) tablet 4 mg (has no administration in time range)    Or  ondansetron (ZOFRAN) injection 4 mg (has no administration in time range)  thiamine (B-1) injection 100 mg (100 mg  Intravenous Given 02/15/19 1143)    Mobility walks with person assist High fall risk   Focused Assessments Pulmonary Assessment Handoff:  Lung sounds: L Breath Sounds: Clear R Breath Sounds: Clear O2 Device: Nasal Cannula O2 Flow Rate (L/min): (S) 2 L/min(supposed to wear 2L at all  times)      R Recommendations: See Admitting Provider Note  Report given to:   Additional Notes:

## 2019-02-15 NOTE — ED Triage Notes (Addendum)
Per [facility], pt from [facility] on [address] due to altered mental status/drowsiness that was discovered by staff this morning. Unknown lsn. Pt just started taking clonopin 2 days ago. Also takes risperdal. VSS. Axo to person and place only. Following commands but very lethargic. Pt was mid 69s on RA, supposed to wear 2L at all times but is noncompliant. Placed on 4L and sat at 98%.

## 2019-02-15 NOTE — ED Notes (Signed)
Portable xray at bedside.

## 2019-02-15 NOTE — Plan of Care (Signed)
°  Problem: Coping: °Goal: Level of anxiety will decrease °Outcome: Progressing °  °

## 2019-02-15 NOTE — ED Provider Notes (Signed)
[facility]   CSN: [id number] Arrival date & time: 02/15/19  [id number]    History   Chief Complaint Chief Complaint  Patient presents with   Altered Mental Status    HPI Amy RossettiSusan F Solis is a 68 y.o. female who presents the emergency department via EMS for altered mental status and hypoxia.  She has a past medical history of chronic hypoxic respiratory failure due to pulmonary fibrosis and mild mental retardation.  The patient lives in an independent living facility and was found this morning by staff to be very somnolent.  She did just get prescribed Klonopin and is been taking that.  She is unable to give history and there is a level 5 caveat.  She is responsive to voice and opens her eyes.  She is oriented to person and currently knows she is in the hospital but not which 1.  Nothing else is known about the patient's history at this time.     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      Home Medications    Prior to Admission medications   Medication Sig Start Date End Date Taking? Authorizing Provider  amLODipine-benazepril (LOTREL) 5-20 MG capsule Take 1 capsule daily by mouth. 05/05/13  Yes [provider]  ammonium  lactate (AMLACTIN) 12 % cream Apply 1 g topically 2 (two) times daily. Apply to face   Yes [provider]  aspirin 81 MG tablet Take 1 tablet daily by mouth.   Yes [provider]  atorvastatin (LIPITOR) 10 MG tablet Take 10 mg by mouth daily.   Yes [provider]  clonazePAM (KLONOPIN) 0.5 MG tablet Take 0.5 mg by mouth every 8 (eight) hours as needed for anxiety.   Yes [provider]  Dextromethorphan-quiNIDine (NUEDEXTA) 20-10 MG capsule Take 1 tablet by mouth 2 (two) times daily.    Yes [provider]  fluticasone (FLONASE) 50 MCG/ACT nasal spray Place 2 sprays into both nostrils daily.   Yes [provider]  gabapentin (NEURONTIN) 800 MG tablet Take 800 mg by mouth at bedtime.  05/17/13  Yes [provider]  meloxicam (MOBIC) 15 MG tablet Take 15 mg by mouth daily.   Yes [provider]  methimazole (TAPAZOLE) 5 MG tablet Take 5 mg by mouth daily.   Yes [provider]  Multiple Vitamin (MULTIVITAMIN WITH MINERALS) TABS tablet Take 1 tablet by mouth daily.   Yes [provider]  omeprazole (PRILOSEC) 20 MG capsule Take 1 capsule by mouth daily with breakfast.  06/13/13  Yes [provider]  oxybutynin (DITROPAN-XL) 10 MG 24 hr tablet Take 10 mg by mouth daily with breakfast.   Yes [provider]  OXYGEN Inhale 2 L into  the lungs continuous.    Yes [provider]  PARoxetine (PAXIL) 40 MG tablet Take 40 mg by mouth daily.  06/13/13  Yes [provider]  Polyethyl Glycol-Propyl Glycol (SYSTANE ULTRA) 0.4-0.3 % SOLN Place 1 drop into both eyes 2 (two) times daily.   Yes [provider]  risperiDONE (RISPERDAL) 0.5 MG tablet Take 0.5 mg by mouth 2 (two) times daily.   Yes [provider]  risperiDONE (RISPERDAL) 0.5 MG tablet Take 0.5 mg by mouth daily as needed (agitation).   Yes [provider]  risperiDONE (RISPERDAL) 2 MG tablet Take 2 mg  by mouth at bedtime. Takes 0.5 in the morning and 2mg  at bedtime 05/17/13  Yes [provider]  sodium chloride (MURO 128) 5 % ophthalmic solution Place 1 drop into both eyes 3 (three) times daily.   Yes [provider]  sodium chloride 1 g tablet Take 1 g by mouth 3 (three) times a week. Monday, Wednesday, Friday   Yes [provider]  SODIUM FLUORIDE, DENTAL RINSE, (PREVIDENT) 0.2 % SOLN Use as directed 1 application in the mouth or throat 2 (two) times daily.   Yes [provider]    Family History Family History  Problem Relation Age of Onset   Breast cancer Mother        diagnosed in her 10780's   Heart attack Father    Heart attack Brother     Social History Social History   Tobacco Use   Smoking status: Never Smoker   Smokeless tobacco: Never Used  Substance Use Topics   Alcohol use: No   Drug use: No     Allergies   Patient has no known allergies.   Review of Systems Review of Systems  Unable to perform ROS: Mental status change     Physical Exam Updated Vital Signs BP (!) 147/84    Pulse 64    Temp (!) 97.5 F (36.4 C) (Rectal)    Resp 17    SpO2 100%   Physical Exam Vitals signs and nursing note reviewed. Exam conducted with a chaperone present.  Constitutional:      General: She is not in acute distress.    Appearance: Normal appearance. She is well-developed, well-groomed and normal weight. She is not ill-appearing, toxic-appearing or diaphoretic.     Interventions: Nasal cannula in place.  HENT:     Head: Normocephalic and atraumatic.  Eyes:     General: No scleral icterus.    Conjunctiva/sclera: Conjunctivae normal.  Neck:     Musculoskeletal: Normal range of motion.  Cardiovascular:     Rate and Rhythm: Normal rate and regular rhythm.     Heart sounds: Normal heart sounds. No murmur. No friction rub. No gallop.   Pulmonary:     Effort: Pulmonary effort is normal. No respiratory distress.     Breath  sounds: Normal breath sounds.  Abdominal:     General: Bowel sounds are normal. There is no distension.     Palpations: Abdomen is soft. There is no mass.     Tenderness: There is no abdominal tenderness. There is no guarding.  Musculoskeletal:     Right lower leg: Edema present.     Left lower leg: Edema present.  Skin:    General: Skin is warm and dry.  Neurological:     Mental Status: She is easily aroused. She is lethargic and disoriented.     GCS: GCS eye subscore is 3. GCS verbal subscore  is 4. GCS motor subscore is 6.  Psychiatric:        Behavior: Behavior normal.      ED Treatments / Results  Labs (all labs ordered are listed, but only abnormal results are displayed) Labs Reviewed  COMPREHENSIVE METABOLIC PANEL - Abnormal; Notable for the following components:      Result Value   CO2 21 (*)    Glucose, Bld 100 (*)    Calcium 8.6 (*)    Total Protein 5.8 (*)    Albumin 3.2 (*)    GFR calc non Af Amer 57 (*)    All other components within normal limits  CBC WITH DIFFERENTIAL/PLATELET - Abnormal; Notable for the following components:   Lymphs Abs 0.5 (*)    All other components within normal limits  URINALYSIS, COMPLETE (UACMP) WITH MICROSCOPIC - Abnormal; Notable for the following components:   Color, Urine AMBER (*)    APPearance CLOUDY (*)    Specific Gravity, Urine 1.032 (*)    Bilirubin Urine SMALL (*)    Ketones, ur 5 (*)    Protein, ur 100 (*)    Bacteria, UA FEW (*)    All other components within normal limits  POCT I-STAT EG7 - Abnormal; Notable for the following components:   pO2, Ven 59.0 (*)    All other components within normal limits  SARS CORONAVIRUS 2 (HOSPITAL ORDER, PERFORMED IN [facility])  CULTURE, BLOOD (ROUTINE X 2)  CULTURE, BLOOD (ROUTINE X 2)  AMMONIA  LACTIC ACID, PLASMA  ETHANOL  RAPID URINE DRUG SCREEN, HOSP PERFORMED  CK  HIV ANTIBODY (ROUTINE TESTING W REFLEX)  BLOOD GAS, ARTERIAL  CBG MONITORING, ED  I-STAT  VENOUS BLOOD GAS, ED  POCT I-STAT 7, (LYTES, BLD GAS, ICA,H+H)    EKG EKG Interpretation  Date/Time:  Saturday February 15 2019 10:21:10 EDT Ventricular Rate:  80 PR Interval:    QRS Duration: 93 QT Interval:  402 QTC Calculation: 464 R Axis:   8 Text Interpretation:  Sinus rhythm Borderline T wave abnormalities No old tracing to compare Confirmed by Eber HongMiller, Brian ([id number]) on 02/15/2019 10:56:53 AM   Radiology Dg Chest 1 View  Result Date: 02/15/2019 CLINICAL DATA:  Altered mental status. EXAM: CHEST  1 VIEW COMPARISON:  May 22, 2017 FINDINGS: The mediastinal contour is normal. The heart size is normal. The lung volumes are low. There is chronic elevation of right hemidiaphragm. No focal pneumonia, pulmonary edema or pleural effusion is identified. The bony structures are stable. IMPRESSION: Low lung volumes.  No acute cardiopulmonary disease identified. Electronically Signed   By: Sherian ReinWei-Chen  Lin M.D.   On: 02/15/2019 11:18   Ct Head Wo Contrast  Result Date: 02/15/2019 CLINICAL DATA:  Altered mental status EXAM: CT HEAD WITHOUT CONTRAST TECHNIQUE: Contiguous axial images were obtained from the base of the skull through the vertex without intravenous contrast. COMPARISON:  June 16, 2013 FINDINGS: Brain: No evidence of acute infarction, hemorrhage, hydrocephalus, extra-axial collection or mass lesion/mass effect. 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 Vascular: No hyperdense vessel is noted. Skull: Normal. Negative for fracture or focal lesion. Sinuses/Orbits: No acute finding. Other: None IMPRESSION: No focal acute intracranial abnormality identified. Electronically Signed   By: Sherian ReinWei-Chen  Lin M.D.   On: 02/15/2019 12:38    Procedures Procedures (including critical care time)  Medications Ordered in ED Medications  enoxaparin (LOVENOX) injection 40 mg (has no administration in time range)  0.9 % NaCl with  KCl 20 mEq/ L   infusion ( Intravenous New Bag/Given 02/15/19 1442)  sodium chloride flush (NS) 0.9 % injection 3 mL (3 mLs Intravenous Not Given 02/15/19 1438)  sodium chloride flush (NS) 0.9 % injection 3 mL (has no administration in time range)  0.9 %  sodium chloride infusion (has no administration in time range)  acetaminophen (TYLENOL) tablet 650 mg (has no administration in time range)    Or  acetaminophen (TYLENOL) suppository 650 mg (has no administration in time range)  oxyCODONE (Oxy IR/ROXICODONE) immediate release tablet 5 mg (has no administration in time range)  polyethylene glycol (MIRALAX / GLYCOLAX) packet 17 g (has no administration in time range)  ondansetron (ZOFRAN) tablet 4 mg (has no administration in time range)    Or  ondansetron (ZOFRAN) injection 4 mg (has no administration in time range)  thiamine (B-1) injection 100 mg (100 mg Intravenous Given 02/15/19 1143)     Initial Impression / Assessment and Plan / ED Course  I have reviewed the triage vital signs and the nursing notes.  Pertinent labs & imaging results that were available during my care of the patient were reviewed by me and considered in my medical decision making (see chart for details).        68 year old female with history of MR who presents with lethargy and encephalopathy of unknown origin. The differential diagnosis for AMS is extensive and includes, but is not limited to: drug overdose - opioids, alcohol, sedatives, antipsychotics, drug withdrawal, others; Metabolic: hypoxia, hypoglycemia, hyperglycemia, hypercalcemia, hypernatremia, hyponatremia, uremia, hepatic encephalopathy, hypothyroidism, hyperthyroidism, vitamin B12 or thiamine deficiency, carbon monoxide poisoning, Wilson's disease, Lactic acidosis, DKA/HHOS; Infectious: meningitis, encephalitis, bacteremia/sepsis, urinary tract infection, pneumonia, neurosyphilis; Structural: Space-occupying lesion, (brain tumor, subdural hematoma, hydrocephalus,); Vascular:  stroke, subarachnoid hemorrhage, coronary ischemia, hypertensive encephalopathy, CNS vasculitis, thrombotic thrombocytopenic purpura, disseminated intravascular coagulation, hyperviscosity; Psychiatric: Schizophrenia, depression; Other: Seizure, hypothermia, heat stroke, ICU psychosis, dementia -"sundowning."  I personally reviewed the patient's lab work-up which shows no evidence of hypercarbia, CMP shows slightly low bicarb, slightly elevated glucose.  Low bicarb is likely secondary to her chronic hypoxia, CBC shows no significant abnormalities.  Urine is questionable for infection although likely this is contamination.  Patient has a negative ethanol, negative UDS, CK without abnormality.  Coronavirus negative.  CBG is within normal limits.  Negative ammonia and lactic acid.  I personally reviewed the patient's head CT which shows no acute findings.  Chest x-ray I reviewed personally as well which shows low lung volumes but no acute edema or Consolidations.  EKG is unchanged from previous tracings.  Patient has has altered mental status.  She is still somnolent.  I suspect polypharmacy.  Patient seen and shared visit with Dr. Sabra Heck who agrees with plan for admission.  I spoke with Dr. Evangeline Gula will admit the patient. Final Clinical Impressions(s) / ED Diagnoses   Final diagnoses:  Encephalopathy    ED Discharge Orders    None       Margarita Mail, PA-C 02/15/19 1515    Noemi Chapel, MD 02/18/19 2057

## 2019-02-15 NOTE — H&P (Signed)
History and Physical    Amy Solis IPJ:[id number] DOB: [birth year] DOA: 02/15/2019  PCP: Chesley Noon, MD  Patient coming from: [facility] independent living center on [address]  I have personally briefly reviewed patient's old medical records in [place]  Chief Complaint: Drowsy with altered mental status  HPI: Amy Solis is a 68 y.o. female with medical history significant of depression, hyperlipidemia, hypertension, mild mental retardation, dysfunction, nocturnal hypoxemia, dyspnea and osteoporosis presents the emergency department via EMS for altered mental status and hypoxia.  Chronic hypoxic respiratory failure due to pulmonary fibrosis.  She uses oxygen at 2 L at baseline.  Lives in the independent living facility at [facility] independent living center.  This morning staff went in to check on her and she was found to be very somnolent.  2 days ago she was prescribed Klonopin.  Has been taking that as prescribed.  Presently she is unable to give any history.  She is responsive to voice opens her eyes but falls immediately back to sleep.  She does know her name.  Knows she is in the hospital but is not sure which one.   ED Course: View of medical record reveals that she has a history of ICU psychosis, dementia, and sundowning.  Has had a history of DKA.  Extensive problem list including drug overdose of opioids, alcohol sedatives antipsychotic drug withdrawal and others.  Lab work consistent with hypoxemia but no hypercarbia.  Lightly low bicarb elevated glucose.  CBC unremarkable.  Ethanol level negative urine drug screen negative, CPK normal.  Coronavirus negative.  And lactate levels also negative.  CT scan shows no acute intracranial abnormalities.  Chest x-ray shows no acute cardiopulmonary abnormalities.  I personally reviewed the CT scan and the chest x-ray.  EKG shows sinus rhythm with T wave abnormalities but no old tracing is available for evaluation.  Patient felt to  have polypharmacy and was referred to me for further evaluation and management.  Review of Systems: Level 5 caveat due to being obtunded unable to participate due to medical condition  Past Medical History:  Diagnosis Date  . Depressive disorder   . Hyperlipemia   . Hypertension   . Mild mental retardation   . Osteoporosis     Past Surgical History:  Procedure Laterality Date  . ABDOMINAL HYSTERECTOMY      Social History   Social History Narrative   Lives in independent living facility     reports that she has never smoked. She has never used smokeless tobacco. She reports that she does not drink alcohol or use drugs.  No Known Allergies  Family History  Problem Relation Age of Onset  . Breast cancer Mother        diagnosed in her 73's  . Heart attack Father   . Heart attack Brother      Prior to Admission medications   Medication Sig Start Date End Date Taking? Authorizing Provider  amLODipine-benazepril (LOTREL) 5-20 MG capsule Take 1 capsule daily by mouth. 05/05/13  Yes [provider]  aspirin 81 MG tablet Take 1 tablet daily by mouth.   Yes [provider]  atorvastatin (LIPITOR) 10 MG tablet Take 10 mg by mouth daily.   Yes [provider]  fluticasone (FLONASE) 50 MCG/ACT nasal spray Place 2 sprays into both nostrils daily.   Yes [provider]  gabapentin (NEURONTIN) 800 MG tablet Take 800 mg by mouth at bedtime.  05/17/13  Yes [provider]  meloxicam (MOBIC) 15 MG tablet Take 15 mg by mouth daily.   Yes [provider]  methimazole (TAPAZOLE) 5 MG tablet Take 5 mg by mouth daily.   Yes [provider]  Multiple Vitamin (MULTIVITAMIN WITH MINERALS) TABS tablet Take 1 tablet by mouth daily.   Yes [provider]  ammonium lactate (AMLACTIN) 12 % cream Apply topically as needed for dry skin.    [provider]  Artificial Tear Ointment (ARTIFICIAL TEARS) ointment Place 1  application into both eyes at bedtime.    [provider]  Dextromethorphan-Quinidine (NUEDEXTA) 20-10 MG CAPS Take 1 tablet by mouth 2 (two) times daily.    [provider]  loperamide (IMODIUM A-D) 2 MG tablet Take 2 mg by mouth every 6 (six) hours as needed for diarrhea or loose stools.    [provider]  omeprazole (PRILOSEC) 20 MG capsule Take 1 capsule by mouth daily with breakfast.  06/13/13   [provider]  oxybutynin (DITROPAN-XL) 10 MG 24 hr tablet Take 10 mg by mouth daily with breakfast.    [provider]  OXYGEN Inhale 1 Inhaler See admin instructions into the lungs.    [provider]  PARoxetine (PAXIL) 30 MG tablet Take 1 tablet by mouth daily. 06/13/13   [provider]  Polyethyl Glycol-Propyl Glycol (SYSTANE ULTRA) 0.4-0.3 % SOLN Place 1 drop into both eyes 2 (two) times daily.    [provider]  risperiDONE (RISPERDAL) 1 MG tablet Take 1-2 tablets by mouth 2 (two) times daily. Takes 0.5 in the morning and 2mg  at bedtime 05/17/13   [provider]  SODIUM FLUORIDE, DENTAL RINSE, (PREVIDENT) 0.2 % SOLN Use as directed 1 application in the mouth or throat 2 (two) times daily.    [provider]    Physical Exam:  Constitutional: Somnolent but does wake up to her name, comfortable Vitals:   02/15/19 1230 02/15/19 1300 02/15/19 1330 02/15/19 1400  BP: 114/72 107/71 105/69 (!) 147/84  Pulse: 71 73 72 64  Resp: 14 15 15 17   Temp:      TempSrc:      SpO2: 98% 98% 97% 100%   Eyes: PERRL, lids and conjunctivae normal ENMT: Mucous membranes are dry. Posterior pharynx clear of any exudate or lesions.Normal dentition.  Neck: normal, supple, no masses, no thyromegaly Respiratory: clear to auscultation bilaterally, no wheezing, no crackles. Normal respiratory effort. No accessory muscle use.  Cardiovascular: Regular rate and rhythm, no murmurs / rubs / gallops. No extremity edema. 2+ pedal  pulses. No carotid bruits.  Abdomen: no tenderness, no masses palpated. No hepatosplenomegaly. Bowel sounds positive.  Musculoskeletal: no clubbing / cyanosis. No joint deformity upper and lower extremities. Good ROM, no contractures. Normal muscle tone.  Skin: no rashes, lesions, ulcers. No induration Neurologic: Sensation intact, deep tendon reflexes normal, Psychiatric: Unable due to current condition   Labs on Admission: I have personally reviewed following labs and imaging studies  CBC: Recent Labs  Lab 02/15/19 1031 02/15/19 1059 02/15/19 1124  WBC 7.1  --   --   NEUTROABS 6.1  --   --   HGB 13.4 13.6 13.3  HCT 41.3 40.0 39.0  MCV 98.6  --   --   PLT 265  --   --    Basic Metabolic Panel: Recent Labs  Lab 02/15/19 1031 02/15/19 1059 02/15/19 1124  NA 140 141 140  K 4.8 4.2 4.1  CL 109  --   --  CO2 21*  --   --   GLUCOSE 100*  --   --   BUN 21  --   --   CREATININE 1.00  --   --   CALCIUM 8.6*  --   --    Liver Function Tests: Recent Labs  Lab 02/15/19 1031  AST 28  ALT 18  ALKPHOS 102  BILITOT 1.1  PROT 5.8*  ALBUMIN 3.2*    Recent Labs  Lab 02/15/19 1015  AMMONIA 14   Cardiac Enzymes: Recent Labs  Lab 02/15/19 1031  CKTOTAL 151   CBG: Recent Labs  Lab 02/15/19 1016  GLUCAP 94   Urine analysis:    Component Value Date/Time   COLORURINE AMBER (A) 02/15/2019 1031   APPEARANCEUR CLOUDY (A) 02/15/2019 1031   LABSPEC 1.032 (H) 02/15/2019 1031   PHURINE 5.0 02/15/2019 1031   GLUCOSEU NEGATIVE 02/15/2019 1031   HGBUR NEGATIVE 02/15/2019 1031   BILIRUBINUR SMALL (A) 02/15/2019 1031   KETONESUR 5 (A) 02/15/2019 1031   PROTEINUR 100 (A) 02/15/2019 1031   NITRITE NEGATIVE 02/15/2019 1031   LEUKOCYTESUR NEGATIVE 02/15/2019 1031    Radiological Exams on Admission: Dg Chest 1 View  Result Date: 02/15/2019 CLINICAL DATA:  Altered mental status. EXAM: CHEST  1 VIEW COMPARISON:  May 22, 2017 FINDINGS: The mediastinal contour is normal.  The heart size is normal. The lung volumes are low. There is chronic elevation of right hemidiaphragm. No focal pneumonia, pulmonary edema or pleural effusion is identified. The bony structures are stable. IMPRESSION: Low lung volumes.  No acute cardiopulmonary disease identified. Electronically Signed   By: Sherian ReinWei-Chen  Lin M.D.   On: 02/15/2019 11:18   Ct Head Wo Contrast  Result Date: 02/15/2019 CLINICAL DATA:  Altered mental status EXAM: CT HEAD WITHOUT CONTRAST TECHNIQUE: Contiguous axial images were obtained from the base of the skull through the vertex without intravenous contrast. COMPARISON:  June 16, 2013 FINDINGS: Brain: No evidence of acute infarction, hemorrhage, hydrocephalus, extra-axial collection or mass lesion/mass effect. 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 Vascular: No hyperdense vessel is noted. Skull: Normal. Negative for fracture or focal lesion. Sinuses/Orbits: No acute finding. Other: None IMPRESSION: No focal acute intracranial abnormality identified. Electronically Signed   By: Sherian ReinWei-Chen  Lin M.D.   On: 02/15/2019 12:38    EKG: Independently reviewed.  Sinus rhythm with T wave abnormalities no old tracing for comparison.  Assessment/Plan Principal Problem:   Hypoxemic encephalopathy (HCC) Active Problems:   Hypoxemia   Nocturnal hypoxemia   Coronary artery calcification   Diastolic dysfunction   Acute metabolic encephalopathy   Benzodiazepine causing adverse effect in therapeutic use   Hypoxic encephalopathy (HCC)   1.  Hypoxemic encephalopathy: Presents with low O2 sats on room air PO2 was 59.  She is usually on 2 L of oxygen.  Sats immediately improved with institution of oxygen and ABG did not reveal any hypercarbia.  Suspect that hypoxemia and encephalopathy may be also related to benzodiazepine.  At admission we were unaware of her history of drug overdose.  At some point it may be beneficial to do a  pill count on her Klonopin bottle.  2.  Benzodiazepine causing adverse effect in therapeutic use: We will hold sedating medications and monitor patient's response.  Would highly recommend a pill count to determine if she has been over aggressively using her medication.  3.  Hypoxemia as well as nocturnal hypoxemia: Continue O2 supplementation.  4.  Coronary artery  calcifications: Noted continue supportive care.  5.  Diastolic dysfunction: Noted continue home medication management  DVT prophylaxis: Enoxaparin Code Status: Full code Family Communication: Spoke with Amy Solis patient's next of kin and gave him an update on her condition.  He may be reached at [phone] Disposition Plan: Likely home in 24 hours Consults called: None Admission status: It is my clinical opinion that referral for OBSERVATION is reasonable and necessary in this patient based on the above information provided. The aforementioned taken together are felt to place the patient at high risk for further clinical deterioration. However it is anticipated that the patient may be medically stable for discharge from the hospital within 24 to 48 hours.     Lahoma Crockerheresa C Kriste Broman MD FACP [facility] Pager [phone]  How to contact the [facility] Attending or Consulting provider 7A - 7P or covering provider during after hours 7P -7A, for this patient?  1. Check the care team in [facility] and look for a) attending/consulting TRH provider listed and b) the [facility] team listed 2. Log into [email or web address] and use [place]'s universal password to access. If you do not have the password, please contact the hospital operator. 3. Locate the [facility] provider you are looking for under [facility] and page to a number that you can be directly reached. 4. If you still have difficulty reaching the provider, please page the [facility] (Director on Call) for the Hospitalists listed on amion for assistance.  If 7PM-7AM, please contact night-coverage  [email or web address] Password [email or web address]  02/15/2019, 2:26 PM

## 2019-02-15 NOTE — ED Notes (Signed)
Report attempted 

## 2019-02-16 DIAGNOSIS — G931 Anoxic brain damage, not elsewhere classified: Secondary | ICD-10-CM | POA: Diagnosis not present

## 2019-02-16 LAB — BASIC METABOLIC PANEL WITH GFR
Anion gap: 8 (ref 5–15)
BUN: 17 mg/dL (ref 8–23)
CO2: 23 mmol/L (ref 22–32)
Calcium: 8.4 mg/dL — ABNORMAL LOW (ref 8.9–10.3)
Chloride: 110 mmol/L (ref 98–111)
Creatinine, Ser: 0.8 mg/dL (ref 0.44–1.00)
GFR calc Af Amer: >60 mL/min
GFR calc non Af Amer: >60 mL/min
Glucose, Bld: 85 mg/dL (ref 70–99)
Potassium: 3.9 mmol/L (ref 3.5–5.1)
Sodium: 141 mmol/L (ref 135–145)

## 2019-02-16 LAB — CBC
HCT: 40.7 % (ref 36.0–46.0)
Hemoglobin: 13.4 g/dL (ref 12.0–15.0)
MCH: 32.4 pg (ref 26.0–34.0)
MCHC: 32.9 g/dL (ref 30.0–36.0)
MCV: 98.3 fL (ref 80.0–100.0)
Platelets: 245 10*3/uL (ref 150–400)
RBC: 4.14 MIL/uL (ref 3.87–5.11)
RDW: 13 % (ref 11.5–15.5)
WBC: 4.2 10*3/uL (ref 4.0–10.5)
nRBC: 0 % (ref 0.0–0.2)

## 2019-02-16 MED ORDER — GABAPENTIN 800 MG PO TABS: 400.0000 mg | ORAL_TABLET | Freq: Every day | ORAL | 0 refills | Status: DC

## 2019-02-16 MED ORDER — RISPERIDONE 0.5 MG PO TABS: 0.5000 mg | ORAL_TABLET | Freq: Two times a day (BID) | ORAL | 0 refills | Status: DC

## 2019-02-16 NOTE — [place] FL2 (Signed)
[place] MEDICAID FL2 LEVEL OF CARE SCREENING TOOL     IDENTIFICATION  Patient Name: Amy RossettiSusan F Solis Birthdate: January 20, 1951 Sex: female Admission Date (Current Location): 02/15/2019  [facility] [place]   Facility and Address:  [facility] [place], [address], [place], [place] [address]      Provider Number: [id number]  Attending Physician Name and Address:  Rhetta MuraSamtani, Jai-Gurmukh, MD  Relative Name and Phone Number:  Amy Solis, HCPOA, [phone]    Current Level of Care: Hospital Recommended Level of Care: Assisted Living Facility Prior Approval Number:    Date Approved/Denied:   PASRR Number:    Discharge Plan: Domiciliary (Rest home)    Current Diagnoses: Patient Active Problem List   Diagnosis Date Noted  . Benzodiazepine causing adverse effect in therapeutic use 02/15/2019  . Hypoxemic encephalopathy (HCC) 02/15/2019  . Diastolic dysfunction 06/06/2016  . Nocturnal hypoxemia 05/09/2016  . Coronary artery calcification 05/09/2016  . Family history of early CAD 05/09/2016  . Dyspnea 03/09/2016    Orientation RESPIRATION BLADDER Height & Weight     Self, Place, Time  O2(89, [place], 2L) Continent, External catheter Weight:   Height:     BEHAVIORAL SYMPTOMS/MOOD NEUROLOGICAL BOWEL NUTRITION STATUS      Continent Diet(low sodium/heart healthy, thin liquids)  AMBULATORY STATUS COMMUNICATION OF NEEDS Skin   Limited Assist   Bruising, Skin abrasions(Skin Abrasion/Bruising on arm, leg, and elbow)                       Personal Care Assistance Level of Assistance  Bathing, Feeding, Dressing, Total care Bathing Assistance: Limited assistance Feeding assistance: Independent Dressing Assistance: Limited assistance Total Care Assistance: Limited assistance   Functional Limitations Info  Sight, Hearing, Speech Sight Info: Adequate Hearing Info: Adequate Speech Info: Adequate    SPECIAL CARE FACTORS FREQUENCY                       Contractures Contractures Info: Not present    Additional Factors Info  Code Status, Allergies, Psychotropic Code Status Info: Full Code Allergies Info: No Known Allergies Psychotropic Info: Paxil 40mg  daily & Risperdal .05mg  2x daily         Current Medications (02/16/2019):  This is the current hospital active medication list Current Facility-Administered Medications  Medication Dose Route Frequency Provider Last Rate Last Dose  . 0.9 %  sodium chloride infusion  250 mL Intravenous PRN Lahoma CrockerSheehan, Theresa C, MD      . acetaminophen (TYLENOL) tablet 650 mg  650 mg Oral Q6H PRN Lahoma CrockerSheehan, Theresa C, MD       Or  . acetaminophen (TYLENOL) suppository 650 mg  650 mg Rectal Q6H PRN Lahoma CrockerSheehan, Theresa C, MD      . amLODipine (NORVASC) tablet 5 mg  5 mg Oral Daily Lahoma CrockerSheehan, Theresa C, MD      . ammonium lactate (LAC-HYDRIN) 12 % lotion 1 application  1 application Topical BID Lahoma CrockerSheehan, Theresa C, MD      . aspirin chewable tablet 81 mg  81 mg Oral Daily Lahoma CrockerSheehan, Theresa C, MD      . atorvastatin (LIPITOR) tablet 10 mg  10 mg Oral Daily Lahoma CrockerSheehan, Theresa C, MD      . benazepril (LOTENSIN) tablet 20 mg  20 mg Oral Daily Lahoma CrockerSheehan, Theresa C, MD      . Dextromethorphan-quiNIDine (NUEDEXTA) 20-10 MG per capsule 1 capsule  1 capsule Oral BID Lahoma CrockerSheehan, Theresa C,  MD      . enoxaparin (LOVENOX) injection 40 mg  40 mg Subcutaneous Q24H Lady Deutscher, MD   40 mg at 02/15/19 2223  . fluticasone (FLONASE) 50 MCG/ACT nasal spray 2 spray  2 spray Each Nare Daily Lady Deutscher, MD      . gabapentin (NEURONTIN) capsule 800 mg  800 mg Oral QHS Lady Deutscher, MD      . meloxicam [facility]) tablet 15 mg  15 mg Oral Daily Lady Deutscher, MD      . methimazole (TAPAZOLE) tablet 5 mg  5 mg Oral Daily Lady Deutscher, MD      . multivitamin with minerals tablet 1 tablet  1 tablet Oral Daily Lady Deutscher, MD      . ondansetron [facility]) tablet 4 mg  4 mg Oral Q6H PRN Lady Deutscher, MD       Or  . ondansetron [facility]) injection 4 mg  4 mg Intravenous Q6H PRN Lady Deutscher, MD      . oxybutynin (DITROPAN-XL) 24 hr tablet 10 mg  10 mg Oral Q breakfast Lady Deutscher, MD      . oxyCODONE (Oxy IR/ROXICODONE) immediate release tablet 5 mg  5 mg Oral Q4H PRN Lady Deutscher, MD      . pantoprazole (PROTONIX) EC tablet 40 mg  40 mg Oral Daily Lady Deutscher, MD      . PARoxetine (PAXIL) tablet 40 mg  40 mg Oral Daily Lady Deutscher, MD      . polyethylene glycol (MIRALAX / GLYCOLAX) packet 17 g  17 g Oral Daily PRN Lady Deutscher, MD      . polyvinyl alcohol (LIQUIFILM TEARS) 1.4 % ophthalmic solution 1 drop  1 drop Both Eyes BID Lady Deutscher, MD   1 drop at 02/15/19 2346  . risperiDONE (RISPERDAL) tablet 0.5 mg  0.5 mg Oral BID Lady Deutscher, MD      . sodium chloride flush (NS) 0.9 % injection 3 mL  3 mL Intravenous Q12H Lady Deutscher, MD   3 mL at 02/15/19 2347  . sodium chloride flush (NS) 0.9 % injection 3 mL  3 mL Intravenous PRN Lady Deutscher, MD         Discharge Medications: Please see discharge summary for a list of discharge medications.  Relevant Imaging Results:  Relevant Lab Results:   Additional Information SSN: [id number]  [place]

## 2019-02-16 NOTE — TOC Transition Note (Signed)
Transition of Care [facility]) - CM/SW Discharge Note   Patient Details  Name: DMYA LONG MRN: [id number] Date of Birth: Nov 24, 1950  [facility]) CM/SW Contact:  Gelene Mink, [place] Phone Number: 02/16/2019, 12:21 PM   Clinical Narrative:     Patient will DC to: [facility]  Anticipated DC date: 02/16/2019 Family notified: Yes Transport by: Corey Harold   Per MD patient ready for DC to . RN, patient, patient's family, and facility notified of DC. Discharge Summary and FL2 sent to facility. RN to call report prior to discharge [phone], Ask for Wellness Nurse). The patient will report to room 365.  DC packet on chart. Ambulance transport requested for patient.   CSW will sign off for now as social work intervention is no longer needed. Please consult [place] again if new needs arise.  Jonnatan Hanners, LCSW-A [place]/Clinical Social Work Department Cell: [phone]    Final next level of care: Assisted Living Barriers to Discharge: No Barriers Identified   Patient Goals and CMS Choice Patient states their goals for this hospitalization and ongoing recovery are:: Pt will return to ALF   Choice offered to / list presented to : NA  Discharge Placement              Patient [facility] bed at: [facility] Patient to be transferred to facility by: [place] Name of family member notified: Sherlene Shams Patient and family notified of of transfer: 02/16/19  Discharge Plan and Services In-house Referral: Clinical Social Work   Post Acute Care Choice: NA          DME Arranged: N/A DME Agency: NA       HH Arranged: NA HH Agency: NA        Social Determinants of Health (SDOH) Interventions     Readmission Risk Interventions No flowsheet data found.

## 2019-02-16 NOTE — Discharge Summary (Signed)
Physician Discharge Summary  Amy Solis XHB:[id number] DOB: 1951/01/17 DOA: 02/15/2019  PCP: Chesley Noon, MD  Admit date: 02/15/2019 Discharge date: 02/16/2019  Time spent: 30 minutes  Recommendations for Outpatient Follow-up:  1. Note carefully changes to medications and recommendations as below 2. Needs outpatient work-up hypoxia at some point and follow-up with primary physician and pulmonology 3. Consider TSH as is on Tapazole 4. Consider cognitive behavioral therapy rather than meds for control of behaviors  Discharge Diagnoses:  Principal Problem:   Hypoxemic encephalopathy ([place]) Active Problems:   Nocturnal hypoxemia   Coronary artery calcification   Diastolic dysfunction   Benzodiazepine causing adverse effect in therapeutic use   Discharge Condition: Improved  Diet recommendation: Heart healthy  There were no vitals filed for this visit.  History of present illness:  68 year old female resident of [facility] independent [facility]-baseline wheelchair-bound Bipolar Mental retardation Diabetes mellitus type 2 HTN Echo 04/2016 normal LV function grade 1 diastolic dysfunction Chronic right hemidiaphragm elevation Chronic hypoxic respiratory failure secondary to pulmonary fibrosis on 2 L at baseline CT mild groundglass infiltrates 2000 1617 with Nocturnal hypoxemia  Admitted in setting confusion, encephalopathy Recent prescription Xanax superimposed on her other medications Completely altered from admission-  Hospital Course:  Toxic metabolic encephalopathy secondary to overuse of benzodiazepine Completely resolved on discharge she is orientable know she is in [place] no she is in the hospital does not recall what happened to her however Medications have been adjusted as per MAR-example would not use Klonopin have cut back gabapentin would use low doses of Risperdal and would use redirection if possible at facility Acute hypoxic respiratory failure initial O2 sat  59% Resume oxygen 2 L at home Hypokalemic on admission Resolved on discharge Hyperthyroid/Graves' disease on Tapazole Mental retardation DM TY 2 with DKA in the past Sugars in the 80s while here resume checks as per outpatient Diabetic neuropathy Cut back gabapentin from 800-400 HTN 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 Needs outpatient follow-up Diastolic heart failure h/o Coronary calcification h/o    Discharge Exam: Vitals:   02/15/19 2312 02/16/19 0638  BP: (!) 109/57   Pulse: (!) 101   Resp: 16   Temp: 98.6 F (37 C) 98 F (36.7 C)  SpO2: 93%     General: Awake alert coherent interactive asking to eat no pain no nausea no vomiting smiling Cardiovascular: S1-S2 slightly tachycardic no murmur rub or gallop Respiratory: Clinically clear no added sound Abdomen soft no rebound no guarding Neurologically intact  Discharge Instructions   Discharge Instructions    Diet - low sodium heart healthy   Complete by: As directed    Discharge instructions   Complete by: As directed    Cut back numerous meds this morning and need to continue lower doses of the same please see MAR Screening CBC as outpatient   Increase activity slowly   Complete by: As directed      Allergies as of 02/16/2019   No Known Allergies     Medication List    STOP taking these medications   clonazePAM 0.5 MG tablet Commonly known as: KLONOPIN     TAKE these medications   amLODipine-benazepril 5-20 MG capsule Commonly known as: LOTREL Take 1 capsule daily by mouth.   ammonium lactate 12 % cream Commonly known as: AMLACTIN Apply 1 g topically 2 (two) times daily. Apply to face   aspirin 81 MG tablet Take 1 tablet daily by mouth.   atorvastatin 10 MG tablet Commonly  known as: LIPITOR Take 10 mg by mouth daily.   Flonase 50 MCG/ACT nasal spray Generic drug: fluticasone Place 2 sprays into both nostrils daily.   gabapentin 800 MG tablet Commonly known as:  NEURONTIN Take 0.5 tablets (400 mg total) by mouth at bedtime. What changed: how much to take   meloxicam 15 MG tablet Commonly known as: MOBIC Take 15 mg by mouth daily.   methimazole 5 MG tablet Commonly known as: TAPAZOLE Take 5 mg by mouth daily.   multivitamin with minerals Tabs tablet Take 1 tablet by mouth daily.   Nuedexta 20-10 MG capsule Generic drug: Dextromethorphan-quiNIDine Take 1 tablet by mouth 2 (two) times daily.   omeprazole 20 MG capsule Commonly known as: PRILOSEC Take 1 capsule by mouth daily with breakfast.   oxybutynin 10 MG 24 hr tablet Commonly known as: DITROPAN-XL Take 10 mg by mouth daily with breakfast.   OXYGEN Inhale 2 L into the lungs continuous.   PARoxetine 40 MG tablet Commonly known as: PAXIL Take 40 mg by mouth daily.   PreviDent 0.2 % Soln Generic drug: SODIUM FLUORIDE (DENTAL RINSE) Use as directed 1 application in the mouth or throat 2 (two) times daily.   risperiDONE 0.5 MG tablet Commonly known as: RISPERDAL Take 1 tablet (0.5 mg total) by mouth 2 (two) times daily. What changed: Another medication with the same name was removed. Continue taking this medication, and follow the directions you see here.   sodium chloride 1 g tablet Take 1 g by mouth 3 (three) times a week. Monday, Wednesday, Friday   sodium chloride 5 % ophthalmic solution Commonly known as: MURO 128 Place 1 drop into both eyes 3 (three) times daily.   Systane Ultra 0.4-0.3 % Soln Generic drug: Polyethyl Glycol-Propyl Glycol Place 1 drop into both eyes 2 (two) times daily.      No Known Allergies    The results of significant diagnostics from this hospitalization (including imaging, microbiology, ancillary and laboratory) are listed below for reference.    Significant Diagnostic Studies: Dg Chest 1 View  Result Date: 02/15/2019 CLINICAL DATA:  Altered mental status. EXAM: CHEST  1 VIEW COMPARISON:  May 22, 2017 FINDINGS: The mediastinal  contour is normal. The heart size is normal. The lung volumes are low. There is chronic elevation of right hemidiaphragm. No focal pneumonia, pulmonary edema or pleural effusion is identified. The bony structures are stable. IMPRESSION: Low lung volumes.  No acute cardiopulmonary disease identified. Electronically Signed   By: Sherian ReinWei-Chen  Lin M.D.   On: 02/15/2019 11:18   Ct Head Wo Contrast  Result Date: 02/15/2019 CLINICAL DATA:  Altered mental status EXAM: CT HEAD WITHOUT CONTRAST TECHNIQUE: Contiguous axial images were obtained from the base of the skull through the vertex without intravenous contrast. COMPARISON:  June 16, 2013 FINDINGS: Brain: No evidence of acute infarction, hemorrhage, hydrocephalus, extra-axial collection or mass lesion/mass effect. 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 Vascular: No hyperdense vessel is noted. Skull: Normal. Negative for fracture or focal lesion. Sinuses/Orbits: No acute finding. Other: None IMPRESSION: No focal acute intracranial abnormality identified. Electronically Signed   By: Sherian ReinWei-Chen  Lin M.D.   On: 02/15/2019 12:38    Microbiology: Recent Results (from the past 240 hour(s))  SARS Coronavirus 2 [facility](Hospital order, Performed in [facility] hospital lab) Nasopharyngeal Nasopharyngeal Swab     Status: None   Collection Time: 02/15/19 10:24 AM   Specimen: Nasopharyngeal Swab  Result Value Ref Range Status  SARS Coronavirus 2 NEGATIVE NEGATIVE Final    Comment: (NOTE) If result is NEGATIVE SARS-CoV-2 target nucleic acids are NOT DETECTED. The SARS-CoV-2 RNA is generally detectable in upper and lower  respiratory specimens during the acute phase of infection. The lowest  concentration of SARS-CoV-2 viral copies this assay can detect is 250  copies / mL. A negative result does not preclude SARS-CoV-2 infection  and should not be used as the sole basis for treatment or other  patient  management decisions.  A negative result may occur with  improper specimen collection / handling, submission of specimen other  than nasopharyngeal swab, presence of viral mutation(s) within the  areas targeted by this assay, and inadequate number of viral copies  (<250 copies / mL). A negative result must be combined with clinical  observations, patient history, and epidemiological information. If result is POSITIVE SARS-CoV-2 target nucleic acids are DETECTED. The SARS-CoV-2 RNA is generally detectable in upper and lower  respiratory specimens dur ing the acute phase of infection.  Positive  results are indicative of active infection with SARS-CoV-2.  Clinical  correlation with patient history and other diagnostic information is  necessary to determine patient infection status.  Positive results do  not rule out bacterial infection or co-infection with other viruses. If result is PRESUMPTIVE POSTIVE SARS-CoV-2 nucleic acids MAY BE PRESENT.   A presumptive positive result was obtained on the submitted specimen  and confirmed on repeat testing.  While 2019 novel coronavirus  (SARS-CoV-2) nucleic acids may be present in the submitted sample  additional confirmatory testing may be necessary for epidemiological  and / or clinical management purposes  to differentiate between  SARS-CoV-2 and other Sarbecovirus currently known to infect humans.  If clinically indicated additional testing with an alternate test  methodology [phone]([id number]) is advised. The SARS-CoV-2 RNA is generally  detectable in upper and lower respiratory sp ecimens during the acute  phase of infection. The expected result is Negative. Fact Sheet for Patients:  BoilerBrush.com.cyhttps://www.fda.gov/media/136312/download Fact Sheet for Healthcare Providers: https://pope.com/https://www.fda.gov/media/136313/download This test is not yet approved or cleared by the [place] FDA and has been authorized for detection and/or diagnosis of SARS-CoV-2 by FDA under  an Emergency Use Authorization (EUA).  This EUA will remain in effect (meaning this test can be used) for the duration of the COVID-19 declaration under Section 564(b)(1) of the Act, 21 U.S.C. section 360bbb-3(b)(1), unless the authorization is terminated or revoked sooner. Performed at [facility], [address] [place], [place] [address]   Blood Cultures (routine x 2)     Status: None (Preliminary result)   Collection Time: 02/15/19 10:31 AM   Specimen: BLOOD LEFT WRIST  Result Value Ref Range Status   Specimen Description BLOOD LEFT WRIST  Final   Special Requests   Final    AEROBIC BOTTLE ONLY Blood Culture results may not be optimal due to an inadequate volume of blood received in culture bottles   Culture   Final    NO GROWTH < 24 HOURS Performed at [facility], [address] [place], [place] [address]    Report Status PENDING  Incomplete  Blood Cultures (routine x 2)     Status: None (Preliminary result)   Collection Time: 02/15/19 11:03 AM   Specimen: BLOOD LEFT HAND  Result Value Ref Range Status   Specimen Description BLOOD LEFT HAND  Final   Special Requests AEROBIC BOTTLE ONLY Blood Culture adequate volume  Final   Culture  Final    NO GROWTH < 24 HOURS Performed at [facility], [address] [place], [place] [address]    Report Status PENDING  Incomplete  MRSA PCR Screening     Status: None   Collection Time: 02/15/19  5:12 PM   Specimen: Nasopharyngeal  Result Value Ref Range Status   MRSA by PCR NEGATIVE NEGATIVE Final    Comment:        The GeneXpert MRSA Assay (FDA approved for NASAL specimens only), is one component of a comprehensive MRSA colonization surveillance program. It is not intended to diagnose MRSA infection nor to guide or monitor treatment for MRSA infections. Performed at [facility], [address] [place], [place] [address]      Labs: Basic Metabolic Panel: Recent Labs  Lab 02/15/19 1031  02/15/19 1059 02/15/19 1124 02/15/19 1519 02/16/19 0709  NA 140 141 140 140 141  K 4.8 4.2 4.1 4.2 3.9  CL 109  --   --   --  110  CO2 21*  --   --   --  23  GLUCOSE 100*  --   --   --  85  BUN 21  --   --   --  17  CREATININE 1.00  --   --   --  0.80  CALCIUM 8.6*  --   --   --  8.4*   Liver Function Tests: Recent Labs  Lab 02/15/19 1031  AST 28  ALT 18  ALKPHOS 102  BILITOT 1.1  PROT 5.8*  ALBUMIN 3.2*   No results for input(s): LIPASE, AMYLASE in the last 168 hours. Recent Labs  Lab 02/15/19 1015  AMMONIA 14   CBC: Recent Labs  Lab 02/15/19 1031 02/15/19 1059 02/15/19 1124 02/15/19 1519 02/16/19 0709  WBC 7.1  --   --   --  4.2  NEUTROABS 6.1  --   --   --   --   HGB 13.4 13.6 13.3 12.9 13.4  HCT 41.3 40.0 39.0 38.0 40.7  MCV 98.6  --   --   --  98.3  PLT 265  --   --   --  245   Cardiac Enzymes: Recent Labs  Lab 02/15/19 1031  CKTOTAL 151   BNP: BNP (last 3 results) No results for input(s): BNP in the last 8760 hours.  ProBNP (last 3 results) No results for input(s): PROBNP in the last 8760 hours.  CBG: Recent Labs  Lab 02/15/19 1016  GLUCAP 94       Signed:  Rhetta MuraJai-Gurmukh Hyla Coard MD   [facility] 02/16/2019, 8:24 AM

## 2019-02-16 NOTE — TOC Initial Note (Signed)
Transition of Care [facility]) - Initial/Assessment Note    Patient Details  Name: Amy Solis MRN: [id number] Date of Birth: 1950/11/14  Transition of Care [facility] CM/SW Contact:    Gelene Mink, [place] Phone Number: 02/16/2019, 11:37 AM  Clinical Narrative:                  CSW called and spoke with Sherlene Shams, patient's HCPOA. He stated that he would like her to return to [facility]. He stated he did not have any questions or concerns.   CSW called and spoke with RN, Karena Addison at [facility]. She requested that the patient's Fl2 and Discharge Summary be faxed over before she was discharged. CSW is awaiting a return phone call that they can take her back.   CSW will continue to follow.   Expected Discharge Plan: Assisted Living Barriers to Discharge: No Barriers Identified   Patient Goals and CMS Choice Patient states their goals for this hospitalization and ongoing recovery are:: Pt will return to [facility]   Choice offered to / list presented to : NA  Expected Discharge Plan and Services Expected Discharge Plan: Assisted Living In-house Referral: Clinical Social Work   Post Acute Care Choice: NA Living arrangements for the past 2 months: [place] Expected Discharge Date: 02/16/19               DME Arranged: N/A DME Agency: NA       HH Arranged: NA HH Agency: NA        Prior Living Arrangements/Services Living arrangements for the past 2 months: [place] Lives with:: Facility Resident Patient language and need for interpreter reviewed:: No Do you feel safe going back to the place where you live?: Yes      Need for Family Participation in Patient Care: Yes (Comment) Care giver support system in place?: Yes (comment)   Criminal Activity/Legal Involvement Pertinent to Current Situation/Hospitalization: No - Comment as needed  Activities of Daily Living      Permission Sought/Granted Permission sought to share  information with : Case Manager Permission granted to share information with : Yes, Verbal Permission Granted  Share Information with NAME: Sherlene Shams  Permission granted to share info w AGENCY: [place] granted to share info w Relationship: HCPOA     Emotional Assessment Appearance:: Appears stated age Attitude/Demeanor/Rapport: Unable to Assess Affect (typically observed): Unable to Assess Orientation: : Oriented to Self, Oriented to Place, Oriented to Situation Alcohol / Substance Use: Not Applicable Psych Involvement: No (comment)  Admission diagnosis:  Encephalopathy [G93.40] Patient Active Problem List   Diagnosis Date Noted  . Benzodiazepine causing adverse effect in therapeutic use 02/15/2019  . Hypoxemic encephalopathy ([place]) 02/15/2019  . Diastolic dysfunction 59/56/3875  . Nocturnal hypoxemia 05/09/2016  . Coronary artery calcification 05/09/2016  . Family history of early CAD 05/09/2016  . Dyspnea 03/09/2016   PCP:  Chesley Noon, MD Pharmacy:  No Pharmacies Listed    Social Determinants of Health (SDOH) Interventions    Readmission Risk Interventions No flowsheet data found.

## 2019-02-17 LAB — HIV ANTIBODY (ROUTINE TESTING W REFLEX): HIV Screen 4th Generation wRfx: NONREACTIVE

## 2019-02-20 LAB — CULTURE, BLOOD (ROUTINE X 2)
Culture: NO GROWTH
Culture: NO GROWTH
Special Requests: ADEQUATE

## 2019-09-25 ENCOUNTER — Ambulatory Visit: Payer: Medicare Other | Admitting: Adult Health

## 2019-10-09 ENCOUNTER — Other Ambulatory Visit: Payer: Self-pay | Admitting: Geriatric Medicine

## 2019-10-09 ENCOUNTER — Other Ambulatory Visit: Payer: Self-pay | Admitting: Family Medicine

## 2019-10-09 ENCOUNTER — Other Ambulatory Visit: Payer: Self-pay | Admitting: Nurse Practitioner

## 2019-10-09 DIAGNOSIS — Z1231 Encounter for screening mammogram for malignant neoplasm of breast: Secondary | ICD-10-CM

## 2019-11-06 ENCOUNTER — Ambulatory Visit
Admission: RE | Admit: 2019-11-06 | Discharge: 2019-11-06 | Disposition: A | Discharge status: Home / Self Care | Payer: Medicare Other | Source: Ambulatory Visit | Attending: Family Medicine | Admitting: Family Medicine

## 2019-11-06 ENCOUNTER — Other Ambulatory Visit: Payer: Self-pay

## 2019-11-06 DIAGNOSIS — Z1231 Encounter for screening mammogram for malignant neoplasm of breast: Secondary | ICD-10-CM

## 2020-06-03 ENCOUNTER — Other Ambulatory Visit: Payer: Self-pay

## 2020-06-03 ENCOUNTER — Non-Acute Institutional Stay: Payer: Medicare Other | Admitting: Nurse Practitioner

## 2020-06-03 DIAGNOSIS — Z515 Encounter for palliative care: Secondary | ICD-10-CM

## 2020-06-03 DIAGNOSIS — J9611 Chronic respiratory failure with hypoxia: Secondary | ICD-10-CM

## 2020-06-03 NOTE — Progress Notes (Signed)
[facility] Consult Note Telephone: [phone]  Fax: [phone]  PATIENT NAME: Amy Solis [phone] (home) [phone] (work) DOB: Mar 08, 1951 MRN: [id number]  PRIMARY CARE PROVIDER:    Reymundo Poll, MD  REFERRING PROVIDER:   Reymundo Poll, MD  RESPONSIBLE PARTY:   Extended Emergency Contact Information Primary Emergency Contact: [place]ella [place] Phone: [phone] Mobile Phone: [phone] Relation: Other Secondary Emergency Contact: [place] Phone: [phone] Mobile Phone: [phone] Relation: Other  I met face to face with patient in facility.   ASSESSMENT AND RECOMMENDATIONS:   1. Advance Care Planning: Goal of care: Patient's report her goal of care is to move away from the assisted living facility, she want to go back to the group home she used to live in. Directives: Code status is Do Not Resuscitation. DNR form present on chart in the facility, copy uploaded to [facility] EMR. Patient unable to substantially contribute to discussion about MOST form due to poor cognition. Unable to reach her legal guardian/attoney, message left with is Herbalist Rosaria Ferries. Will send him an e-mail with copy of MOST form, we will complete form when we have opportunity to talk.  2. Symptom Management:  Patient saturation noted to be 85%, portable oxygen concentrator noted with low battery. Staff report issues with concentrator not holding charge. Patient loves to move around within the facility during the day. Patient encouraged to go back to her room so the battery can be charged. [facility] facility Wellness RN made aware of situation, Resident care coordinator copied on the Email. Recommend reaching out to the equipment supplier for replacement or repairs. Patient voiced no concerns today, denied pain, denied  fever, chills, insomnia, or loss of appetite. Provided general support and encouragement, no other unmet needs identified. Palliative care will continue to provide support to patient, family and the medical team.   3. Follow up Palliative Care Visit: Palliative care will continue to follow for goals of care clarification and symptom management. Return in about 4 - 8 weeks or prn.  4. Family /Caregiver/Community Supports: Patient is mental delayed, she has no known close family, she is originally from [place]. She lived in [facility] assisted living facility. Lenox Ahr a legal guardian [name] who is an [place] and a friend Ronny Bacon who takes her to her medical appointments.  5. Cognitive / Functional decline: Patient awake and alert, she is coherent but unable to participate is complex discussion. She is minimal assist with her ALDs, able to feed self, wheelchair dependent for ambulation. No report of recent falls.  I spent 60 minutes providing this consultation, time includes time spent with patient, chart review, provider coordination, and documentation. More than 50% of the time in this consultation was spent coordinating communication.   HISTORY OF PRESENT ILLNESS:  Amy Solis is a 69 y.o. year old female with multiple medical problems including depression, Bipolar, graves disease on Tapazole, pulmonary Fibrosis on continuous oxygen at 2L, osteoporosis. Patient was on Hospice care from 02/21/2019 - 05/06/2020, was discharged due to no longer meeting eligibility criteria. Palliative Care was asked to follow this patient by consultation request of Dr. Mallie Mussel Trip to help address advance care planning and goals of care. Palliative Care was asked to help address goals of care. This is an initial visit.  CODE STATUS: DNR  PPS: 40%  HOSPICE ELIGIBILITY/DIAGNOSIS: TBD  PHYSICAL EXAM / ROS:  Current and past weights: 146.6lbs up from 132lbs at last month. General: NAD, well appearing, sitting in  her wheelchair in dinning area waiting on lunch Cardiovascular: denied chest pain, +2 edema to bilateral lower legs, TED hose on Pulmonary: no cough, no increased SOB, 85% on room air, oxygen tank battery down GI: no swallowing issues, appetite fair, denied constipation, continent of bowel GU: denies dysuria, continent of urine MSK:  no joint and ROM abnormalities Skin: no rashes or wounds reported Neurological: weakness, otherwise nonfocal  PAST MEDICAL HISTORY:  Past Medical History:  Diagnosis Date  . Depressive disorder   . Hyperlipemia   . Hypertension   . Mild mental retardation   . Osteoporosis     SOCIAL HX:  Social History   Tobacco Use  . Smoking status: Never Smoker  . Smokeless tobacco: Never Used  Substance Use Topics  . Alcohol use: No   FAMILY HX:  Family History  Problem Relation Age of Onset  . Breast cancer Mother        diagnosed in her 74's  . Heart attack Father   . Heart attack Brother    ALLERGIES: No Known Allergies   PERTINENT MEDICATIONS:  Outpatient Encounter Medications as of 06/03/2020  Medication Sig  . amLODipine-benazepril (LOTREL) 5-20 MG capsule Take 1 capsule daily by mouth.  Marland Kitchen ammonium lactate (AMLACTIN) 12 % cream Apply 1 g topically 2 (two) times daily. Apply to face  . aspirin 81 MG tablet Take 1 tablet daily by mouth.  Marland Kitchen atorvastatin (LIPITOR) 10 MG tablet Take 10 mg by mouth daily.  Marland Kitchen Dextromethorphan-quiNIDine (NUEDEXTA) 20-10 MG capsule Take 1 tablet by mouth 2 (two) times daily.   . fluticasone (FLONASE) 50 MCG/ACT nasal spray Place 2 sprays into both nostrils daily.  Marland Kitchen gabapentin (NEURONTIN) 800 MG tablet Take 0.5 tablets (400 mg total) by mouth at bedtime.  . meloxicam (MOBIC) 15 MG tablet Take 15 mg by mouth daily.  . methimazole (TAPAZOLE) 5 MG tablet Take 5 mg by mouth daily.  . Multiple Vitamin (MULTIVITAMIN WITH MINERALS) TABS tablet Take 1 tablet by mouth daily.  Marland Kitchen omeprazole (PRILOSEC) 20 MG capsule Take 1 capsule  by mouth daily with breakfast.   . oxybutynin (DITROPAN-XL) 10 MG 24 hr tablet Take 10 mg by mouth daily with breakfast.  . OXYGEN Inhale 2 L into the lungs continuous.   Marland Kitchen PARoxetine (PAXIL) 40 MG tablet Take 40 mg by mouth daily.   Vladimir Faster Glycol-Propyl Glycol (SYSTANE ULTRA) 0.4-0.3 % SOLN Place 1 drop into both eyes 2 (two) times daily.  . risperiDONE (RISPERDAL) 0.5 MG tablet Take 1 tablet (0.5 mg total) by mouth 2 (two) times daily.  . sodium chloride (MURO 128) 5 % ophthalmic solution Place 1 drop into both eyes 3 (three) times daily.  . sodium chloride 1 g tablet Take 1 g by mouth 3 (three) times a week. Monday, Wednesday, Friday  . SODIUM FLUORIDE, DENTAL RINSE, (PREVIDENT) 0.2 % SOLN Use as directed 1 application in the mouth or throat 2 (two) times daily.   No facility-administered encounter medications on file as of 06/03/2020.     Jari Favre, DNP, AGPCNP-BC

## 2020-09-24 ENCOUNTER — Other Ambulatory Visit: Payer: Self-pay | Admitting: Geriatric Medicine

## 2020-09-24 DIAGNOSIS — Z1231 Encounter for screening mammogram for malignant neoplasm of breast: Secondary | ICD-10-CM

## 2020-11-18 ENCOUNTER — Ambulatory Visit
Admission: RE | Admit: 2020-11-18 | Discharge: 2020-11-18 | Disposition: A | Discharge status: Home / Self Care | Payer: Medicare Other | Source: Ambulatory Visit | Attending: Geriatric Medicine | Admitting: Geriatric Medicine

## 2020-11-18 ENCOUNTER — Other Ambulatory Visit: Payer: Self-pay

## 2020-11-18 DIAGNOSIS — Z1231 Encounter for screening mammogram for malignant neoplasm of breast: Secondary | ICD-10-CM

## 2021-05-04 NOTE — Progress Notes (Signed)
Synopsis: Referred for chronic hypoxemic respiratory failure by Eartha Inch, MD  Subjective:   PATIENT ID: Amy Solis GENDER: female DOB: 02/06/1951, MRN: [id number]  Chief Complaint  Patient presents with   Establish Care    Former patient of MR.  Needs to have a new script for her oxygen sent in for her to get her supplies.  [employer] is her DME>  no issues per the patient.    70yF never smoker with chronic hypoxic respiratory failure due to diastolic dysfunction, R hemidiaphragm elevation, mild cognitive impairment  Last seen in our clinic in 2019 by Tammy Parrett at which point she was set up for POC. She says she's feeling fine. No dyspnea at rest. Does have some DOE - maybe worse over last year per caregiver [name]. She is pretty much wheelchair dependent. Can transfer on her own. Never got infected with covid to her knowledge  She remains on O2 2L continuously.   Denies feeling excessively sleepy during the day although caregiver wonders if she may be sleepy during the day. Snores. No PND.   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  Otherwise pertinent review of systems is negative.  Past Medical History:  Diagnosis Date   Depressive disorder    Hyperlipemia    Hypertension    Mild mental retardation    Osteoporosis      Family History  Problem Relation Age of Onset   Breast cancer Mother        diagnosed in her 66's   Heart attack Father    Heart attack Brother      Past Surgical History:  Procedure Laterality Date   ABDOMINAL HYSTERECTOMY      Social History   Socioeconomic History   Marital status: Single    Spouse name: Not on file   Number of children: Not on file   Years of education: Not on file   Highest education level: Not on file  Occupational History   Not on file  Tobacco Use   Smoking status: Never   Smokeless tobacco: Never  Vaping Use   Vaping Use: Never used  Substance and Sexual Activity   Alcohol  use: No   Drug use: No   Sexual activity: Not on file  Other Topics Concern   Not on file  Social History Narrative   Lives in independent living facility   Social Determinants of Health   Financial Resource Strain: Not on file  Food Insecurity: Not on file  Transportation Needs: Not on file  Physical Activity: Not on file  Stress: Not on file  Social Connections: Not on file  Intimate Partner Violence: Not on file     No Known Allergies   Outpatient Medications Prior to Visit  Medication Sig Dispense Refill   amLODipine (NORVASC) 5 MG tablet Take 5 mg by mouth daily.     aspirin 81 MG tablet Take 1 tablet daily by mouth.     amLODipine-benazepril (LOTREL) 5-20 MG capsule Take 1 capsule daily by mouth.     ammonium lactate (AMLACTIN) 12 % cream Apply 1 g topically 2 (two) times daily. Apply to face     atorvastatin (LIPITOR) 10 MG tablet Take 10 mg by mouth daily.     chlorhexidine (PERIDEX) 0.12 % solution 15 ml     Dextromethorphan-quiNIDine (NUEDEXTA) 20-10 MG capsule Take 1 tablet by mouth 2 (two) times daily.      divalproex (DEPAKOTE) 125 MG DR  tablet 1 tablet     fluticasone (FLONASE) 50 MCG/ACT nasal spray Place 2 sprays into both nostrils daily.     fluticasone (FLONASE) 50 MCG/ACT nasal spray 2 spray in both nostril     gabapentin (NEURONTIN) 800 MG tablet Take 0.5 tablets (400 mg total) by mouth at bedtime. 3 tablet 0   LORazepam (ATIVAN) 0.5 MG tablet Take by mouth.     meloxicam (MOBIC) 15 MG tablet Take 15 mg by mouth daily.     methimazole (TAPAZOLE) 5 MG tablet Take 5 mg by mouth daily.     Multiple Vitamin (MULTIVITAMIN WITH MINERALS) TABS tablet Take 1 tablet by mouth daily.     omeprazole (PRILOSEC) 20 MG capsule Take 1 capsule by mouth daily with breakfast.      oxybutynin (DITROPAN-XL) 10 MG 24 hr tablet Take 10 mg by mouth daily with breakfast.     OXYGEN Inhale 2 L into the lungs continuous.      PARoxetine (PAXIL) 40 MG tablet Take 40 mg by mouth  daily.      Polyethyl Glycol-Propyl Glycol (SYSTANE ULTRA) 0.4-0.3 % SOLN Place 1 drop into both eyes 2 (two) times daily.     risperiDONE (RISPERDAL) 0.5 MG tablet Take 1 tablet (0.5 mg total) by mouth 2 (two) times daily. 3 tablet 0   sodium chloride (MURO 128) 5 % ophthalmic solution Place 1 drop into both eyes 3 (three) times daily.     sodium chloride 1 g tablet Take 1 g by mouth 3 (three) times a week. Monday, Wednesday, Friday     SODIUM FLUORIDE, DENTAL RINSE, (PREVIDENT) 0.2 % SOLN Use as directed 1 application in the mouth or throat 2 (two) times daily.     No facility-administered medications prior to visit.       Objective:   Physical Exam:  General appearance: 70 y.o., female, NAD, chronically ill appearing, a little sleepy in wheelchair but easily arousable Eyes: anicteric sclerae, moist conjunctivae; no lid-lag; PERRL, tracking appropriately HENT: NCAT; oropharynx, MMM, no mucosal ulcerations; normal hard and soft palate Neck: Trachea midline; no lymphadenopathy, no JVD Lungs: a little diminished on R, no crackles, no wheeze, with normal respiratory effort CV: RRR, no MRGs  Abdomen: Soft, non-tender; non-distended, BS present  Extremities: 2+ BLE edema, warm Skin: Normal temperature, turgor and texture; no rash Psych: Appropriate affect Neuro: Alert and oriented to person and place, no focal deficit    Vitals:   05/05/21 1354  BP: 128/70  Pulse: 70  Temp: (!) 97.5 F (36.4 C)  TempSrc: Oral  SpO2: 95%   95% on 2 LPM  BMI Readings from Last 3 Encounters:  08/21/17 26.08 kg/m  07/31/17 27.73 kg/m  06/14/17 28.16 kg/m   Wt Readings from Last 3 Encounters:  08/21/17 142 lb 9.6 oz (64.7 kg)  07/31/17 142 lb (64.4 kg)  06/14/17 144 lb 3.2 oz (65.4 kg)     CBC    Component Value Date/Time   WBC 4.2 02/16/2019 0709   RBC 4.14 02/16/2019 0709   HGB 13.4 02/16/2019 0709   HCT 40.7 02/16/2019 0709   PLT 245 02/16/2019 0709   MCV 98.3 02/16/2019 0709    MCH 32.4 02/16/2019 0709   MCHC 32.9 02/16/2019 0709   RDW 13.0 02/16/2019 0709   LYMPHSABS 0.5 (L) 02/15/2019 1031   MONOABS 0.5 02/15/2019 1031   EOSABS 0.0 02/15/2019 1031   BASOSABS 0.0 02/15/2019 1031    Chest Imaging: HRCT Chest reviewed by me significant  for R hemidiaphragm elevation and no evidence of ILD  Pulmonary Functions Testing Results: No flowsheet data found.    Echocardiogram:   TTE 04/2016: G1DD      Assessment & Plan:   # chronic hypoxemic respiratory failure # right hemidiaphragmatic paralysis Looks extravascularly hypervolemic today with LE edema but she does not appear uncomfortable.  Plan: - CXR today, BMP - obtain record of weights from ALF - after reviewing weights, CXR, BMP consider starting lasix for chronic diastolic heart failure - don't think she'd tolerate PFTs well or perform them well in setting of her debility/cognitive impairment - doesn't sound like she would ever tolerate wearing PAP mask, no need for sleep study      Omar Person, MD  Pulmonary Critical Care 05/05/2021 2:09 PM

## 2021-05-05 ENCOUNTER — Other Ambulatory Visit: Payer: Self-pay

## 2021-05-05 ENCOUNTER — Encounter: Payer: Self-pay | Admitting: Student

## 2021-05-05 ENCOUNTER — Ambulatory Visit (INDEPENDENT_AMBULATORY_CARE_PROVIDER_SITE_OTHER): Payer: Medicare Other

## 2021-05-05 ENCOUNTER — Ambulatory Visit (INDEPENDENT_AMBULATORY_CARE_PROVIDER_SITE_OTHER): Payer: Medicare Other | Admitting: Student

## 2021-05-05 VITALS — BP 128/70 | HR 70 | Temp 97.5°F

## 2021-05-05 DIAGNOSIS — R0609 Other forms of dyspnea: Secondary | ICD-10-CM | POA: Diagnosis not present

## 2021-05-05 DIAGNOSIS — J9611 Chronic respiratory failure with hypoxia: Secondary | ICD-10-CM | POA: Diagnosis not present

## 2021-05-05 LAB — BASIC METABOLIC PANEL
BUN: 22 mg/dL (ref 6–23)
CO2: 28 mEq/L (ref 19–32)
Calcium: 9 mg/dL (ref 8.4–10.5)
Chloride: 108 mEq/L (ref 96–112)
Creatinine, Ser: 0.75 mg/dL (ref 0.40–1.20)
GFR: 79.74 mL/min (ref >60.00)
Glucose, Bld: 92 mg/dL (ref 70–99)
Potassium: 3.8 mEq/L (ref 3.5–5.1)
Sodium: 143 mEq/L (ref 135–145)

## 2021-05-05 LAB — BRAIN NATRIURETIC PEPTIDE: Pro B Natriuretic peptide (BNP): 24 pg/mL (ref 0.0–100.0)

## 2021-05-05 NOTE — Patient Instructions (Signed)
-   O2 prescription renewed - head to the lab today, check chest xray - I will review your weights from your assisted living facility and we may need to start a medication called lasix to help get rid of excess fluid

## 2021-05-13 ENCOUNTER — Other Ambulatory Visit: Payer: Self-pay

## 2021-05-13 ENCOUNTER — Non-Acute Institutional Stay: Payer: Medicare Other

## 2021-05-13 DIAGNOSIS — Z515 Encounter for palliative care: Secondary | ICD-10-CM

## 2021-05-20 NOTE — Progress Notes (Signed)
COMMUNITY PALLIATIVE CARE SW NOTE  PATIENT NAME: Amy Solis DOB: [birth year] MRN: [id number]  PRIMARY CARE PROVIDER: Florentina Jenny, MD  RESPONSIBLE PARTY:  Acct ID - Guarantor Home Phone Work Phone Relationship Acct Type  [id number] Amy Solis, Amy Solis* [phone] Self P/F     [address], [place], [place] [address]     PLAN OF CARE and INTERVENTIONS:             GOALS OF CARE/ ADVANCE CARE PLANNING:  Goal is for patient to remain at the facility. Patient is a DNR. SOCIAL/EMOTIONAL/SPIRITUAL ASSESSMENT/ INTERVENTIONS:  SW completed a follow-up check-in visit with patient at [facility]. Patient was up in her wheelchair in her room. She was not wearing her 02, but SW assisted her with putting in back on. SW engaged patient briefly regarding how she feeling. Patient denied pain. Patient is not able to fully engage in dialogue or give information regarding herself due to cognitive deficits. SW consulted with the facility nurse-Jennifer who reported no concerns regarding patient. Patient remains non-compliant with her o2. She requires assistance with ADL's and is able to feed herself. Patient's appetite is fair. She is ambulates herself in her wheelchair, which she is dependent. Patient last recorded weight is 156.4 lbs. Patient has a legal guardian and a friend Amy Solis who takes her to her medical appointments.  PATIENT/CAREGIVER EDUCATION/ COPING:  Patient appears to be coping well. The staff is attentive and supportive of patient.  PERSONAL EMERGENCY PLAN:  Per facility protocol.  COMMUNITY RESOURCES COORDINATION/ HEALTH CARE NAVIGATION:  None.  FINANCIAL/LEGAL CONCERNS/INTERVENTIONS:  None.     SOCIAL HX:  Social History   Tobacco Use   Smoking status: Never   Smokeless tobacco: Never  Substance Use Topics   Alcohol use: No    CODE STATUS: DNR ADVANCED DIRECTIVES: No MOST FORM COMPLETE:  No HOSPICE EDUCATION PROVIDED: No  PPS: Patient is alert and  oriented to self. She is wheelchair dependent and requires assistance with ADL's  Duration of visit and documentation: 45 minutes.  [address] [place], [place]

## 2021-09-09 ENCOUNTER — Other Ambulatory Visit: Payer: Self-pay | Admitting: Geriatric Medicine

## 2021-09-09 DIAGNOSIS — Z1231 Encounter for screening mammogram for malignant neoplasm of breast: Secondary | ICD-10-CM

## 2021-09-30 ENCOUNTER — Emergency Department (HOSPITAL_COMMUNITY): Payer: Medicare Other

## 2021-09-30 ENCOUNTER — Other Ambulatory Visit: Payer: Self-pay

## 2021-09-30 ENCOUNTER — Encounter (HOSPITAL_COMMUNITY): Payer: Self-pay | Admitting: Emergency Medicine

## 2021-09-30 ENCOUNTER — Emergency Department (HOSPITAL_COMMUNITY)
Admission: EM | Admit: 2021-09-30 | Discharge: 2021-10-01 | Disposition: A | Discharge status: Skilled Nursing Facility | Payer: Medicare Other | Attending: Emergency Medicine | Admitting: Emergency Medicine

## 2021-09-30 DIAGNOSIS — W260XXA Contact with knife, initial encounter: Secondary | ICD-10-CM | POA: Diagnosis not present

## 2021-09-30 DIAGNOSIS — S61511A Laceration without foreign body of right wrist, initial encounter: Secondary | ICD-10-CM | POA: Insufficient documentation

## 2021-09-30 DIAGNOSIS — Y900 Blood alcohol level of less than 20 mg/100 ml: Secondary | ICD-10-CM | POA: Insufficient documentation

## 2021-09-30 DIAGNOSIS — N3000 Acute cystitis without hematuria: Secondary | ICD-10-CM | POA: Diagnosis not present

## 2021-09-30 DIAGNOSIS — Z87440 Personal history of urinary (tract) infections: Secondary | ICD-10-CM | POA: Diagnosis not present

## 2021-09-30 DIAGNOSIS — F1019 Alcohol abuse with unspecified alcohol-induced disorder: Secondary | ICD-10-CM | POA: Diagnosis not present

## 2021-09-30 DIAGNOSIS — J9610 Chronic respiratory failure, unspecified whether with hypoxia or hypercapnia: Secondary | ICD-10-CM | POA: Insufficient documentation

## 2021-09-30 DIAGNOSIS — R45851 Suicidal ideations: Secondary | ICD-10-CM | POA: Insufficient documentation

## 2021-09-30 DIAGNOSIS — Z9981 Dependence on supplemental oxygen: Secondary | ICD-10-CM | POA: Insufficient documentation

## 2021-09-30 DIAGNOSIS — I340 Nonrheumatic mitral (valve) insufficiency: Secondary | ICD-10-CM | POA: Diagnosis not present

## 2021-09-30 DIAGNOSIS — Z20822 Contact with and (suspected) exposure to covid-19: Secondary | ICD-10-CM | POA: Diagnosis not present

## 2021-09-30 DIAGNOSIS — S6991XA Unspecified injury of right wrist, hand and finger(s), initial encounter: Secondary | ICD-10-CM | POA: Diagnosis present

## 2021-09-30 DIAGNOSIS — E785 Hyperlipidemia, unspecified: Secondary | ICD-10-CM | POA: Diagnosis not present

## 2021-09-30 DIAGNOSIS — I10 Essential (primary) hypertension: Secondary | ICD-10-CM | POA: Insufficient documentation

## 2021-09-30 DIAGNOSIS — R5383 Other fatigue: Secondary | ICD-10-CM | POA: Diagnosis present

## 2021-09-30 DIAGNOSIS — F19959 Other psychoactive substance use, unspecified with psychoactive substance-induced psychotic disorder, unspecified: Secondary | ICD-10-CM | POA: Insufficient documentation

## 2021-09-30 LAB — URINALYSIS, ROUTINE W REFLEX MICROSCOPIC
Bilirubin Urine: NEGATIVE
Glucose, UA: NEGATIVE mg/dL
Ketones, ur: 5 mg/dL — AB
Nitrite: NEGATIVE
Protein, ur: 30 mg/dL — AB
RBC / HPF: >50 RBC/hpf — ABNORMAL HIGH (ref 0–5)
Specific Gravity, Urine: 1.008 (ref 1.005–1.030)
WBC, UA: >50 WBC/hpf — ABNORMAL HIGH (ref 0–5)
pH: 6 (ref 5.0–8.0)

## 2021-09-30 LAB — COMPREHENSIVE METABOLIC PANEL
ALT: 13 U/L (ref 0–44)
AST: 13 U/L — ABNORMAL LOW (ref 15–41)
Albumin: 3.5 g/dL (ref 3.5–5.0)
Alkaline Phosphatase: 89 U/L (ref 38–126)
Anion gap: 9 (ref 5–15)
BUN: 19 mg/dL (ref 8–23)
CO2: 29 mmol/L (ref 22–32)
Calcium: 8.9 mg/dL (ref 8.9–10.3)
Chloride: 100 mmol/L (ref 98–111)
Creatinine, Ser: 0.82 mg/dL (ref 0.44–1.00)
GFR, Estimated: >60 mL/min (ref >60)
Glucose, Bld: 87 mg/dL (ref 70–99)
Potassium: 4 mmol/L (ref 3.5–5.1)
Sodium: 138 mmol/L (ref 135–145)
Total Bilirubin: 0.6 mg/dL (ref 0.3–1.2)
Total Protein: 7.5 g/dL (ref 6.5–8.1)

## 2021-09-30 LAB — CBC WITH DIFFERENTIAL/PLATELET
Abs Immature Granulocytes: 0.02 10*3/uL (ref 0.00–0.07)
Basophils Absolute: 0 10*3/uL (ref 0.0–0.1)
Basophils Relative: 1 %
Eosinophils Absolute: 0.2 10*3/uL (ref 0.0–0.5)
Eosinophils Relative: 3 %
HCT: 47.9 % — ABNORMAL HIGH (ref 36.0–46.0)
Hemoglobin: 15.2 g/dL — ABNORMAL HIGH (ref 12.0–15.0)
Immature Granulocytes: 0 %
Lymphocytes Relative: 24 %
Lymphs Abs: 1.3 10*3/uL (ref 0.7–4.0)
MCH: 31.4 pg (ref 26.0–34.0)
MCHC: 31.7 g/dL (ref 30.0–36.0)
MCV: 99 fL (ref 80.0–100.0)
Monocytes Absolute: 0.6 10*3/uL (ref 0.1–1.0)
Monocytes Relative: 10 %
Neutro Abs: 3.5 10*3/uL (ref 1.7–7.7)
Neutrophils Relative %: 62 %
Platelets: 327 10*3/uL (ref 150–400)
RBC: 4.84 MIL/uL (ref 3.87–5.11)
RDW: 14 % (ref 11.5–15.5)
WBC: 5.6 10*3/uL (ref 4.0–10.5)
nRBC: 0 % (ref 0.0–0.2)

## 2021-09-30 LAB — RESP PANEL BY RT-PCR (FLU A&B, COVID) ARPGX2
Influenza A by PCR: NEGATIVE
Influenza B by PCR: NEGATIVE
SARS Coronavirus 2 by RT PCR: NEGATIVE

## 2021-09-30 LAB — PROTIME-INR
INR: 1 (ref 0.8–1.2)
Prothrombin Time: 12.8 seconds (ref 11.4–15.2)

## 2021-09-30 LAB — LACTIC ACID, PLASMA: Lactic Acid, Venous: 0.8 mmol/L (ref 0.5–1.9)

## 2021-09-30 LAB — APTT: aPTT: 28 seconds (ref 24–36)

## 2021-09-30 MED ORDER — CEFTRIAXONE SODIUM 1 G IJ SOLR
1.0000 g | Freq: Once | INTRAMUSCULAR | Status: AC
  Administered 2021-09-30: 1 g via INTRAVENOUS
  Filled 2021-09-30: qty 10

## 2021-09-30 MED ORDER — IPRATROPIUM-ALBUTEROL 0.5-2.5 (3) MG/3ML IN SOLN
3.0000 mL | RESPIRATORY_TRACT | Status: AC
  Administered 2021-09-30 (×3): 3 mL via RESPIRATORY_TRACT
  Filled 2021-09-30 (×3): qty 3

## 2021-09-30 MED ORDER — SODIUM CHLORIDE 0.9 % IV BOLUS
1000.0000 mL | Freq: Once | INTRAVENOUS | Status: AC
Start: 2021-09-30 — End: 2021-09-30
  Administered 2021-09-30: 1000 mL via INTRAVENOUS

## 2021-09-30 MED ORDER — IPRATROPIUM-ALBUTEROL 0.5-2.5 (3) MG/3ML IN SOLN: 3.0000 mL | RESPIRATORY_TRACT | Status: AC

## 2021-09-30 NOTE — Discharge Instructions (Signed)
Amy Solis is having signs of bladder infection.  Urine has positive bacteria, white blood cells and leukocyte esterace. ? ?We have given her a round of IV antibiotics in the ER. ? ?All of her blood work is otherwise reassuring. ? ?We suspect that with oral antibiotics, she will rebounds slowly. ? ?Please return her to the ER if her symptoms worsen; she has increased pain, fevers, chills, inability to keep any medications down, confusion. ? ?

## 2021-09-30 NOTE — ED Notes (Signed)
When I went into patients room her blankets were all on the ground. I asked her if she would like new ones and she said no. She said she did not even want a sheet on her.  ?

## 2021-09-30 NOTE — ED Provider Notes (Signed)
?  Physical Exam  ?BP (!) 150/76   Pulse 87   Resp 14   SpO2 97%  ? ?Physical Exam ? ?Procedures  ?Procedures ? ?ED Course / MDM  ?  ?Medical Decision Making ?Amount and/or Complexity of Data Reviewed ?Labs: ordered. ?Radiology: ordered. ?ECG/medicine tests: ordered. ? ?Risk ?Prescription drug management. ? ? ?Assuming care from Dr. Tyrone Nine. ? ?CC: weakness, malaise/fatigue since this morning. ?Hx of MR, HTN, HL. ? ?No clear indicator for root cause on hx and exam. ?Labs are reassuring - covid and urine test pending. ? ?Dispo: d/c if workup neg. ?VSS, WNL ? ? ?  ?Varney Biles, MD ?09/30/21 2336 ? ?

## 2021-09-30 NOTE — ED Notes (Signed)
When attempting to make contact with Amy Solis, I was able to speak with [name] (wife of [name]). Sarah informed me that [name] was unable to take the phone call, but is aware that the patient was seen and is being discharged.  ?

## 2021-09-30 NOTE — ED Notes (Signed)
Patient was offered a sandwich,crackers,and a drink and she declined all 3.  ?

## 2021-09-30 NOTE — ED Triage Notes (Signed)
Patient present presents from [facility] due to malaise and lethargy this am. She also has been incontinent of urine the past few days.  ? ?HX: Respiratory failure with chronic O2 use (2 L)  ? ?EMS vitals: ?98% SPO2 on 2 L O2 ?100 HR ?108 CBG ?158/96 BP ? ?

## 2021-09-30 NOTE — ED Provider Notes (Signed)
?[facility]-EMERGENCY DEPT ?Provider Note ? ? ?CSN: [id number] ?Arrival date & time: 09/30/21  1246 ? ?  ? ?History ? ?No chief complaint on file. ? ? ?Amy RossettiSusan F Solis is a 71 y.o. female. ? ?71 yo F with a chief complaints of fatigue.  This was noticed just today.  There is no other obvious complaints.  She has a history of recurrent urinary tract infections per EMS.  No obvious fevers.  Denies chest pain difficulty breathing abdominal pain.  Was noted to be tachypneic on EMS exam.  She is chronically on 2 L of oxygen, they increase this to 4 with improvement. ? ? ? ?  ? ?Home Medications ?Prior to Admission medications   ?Medication Sig Start Date End Date Taking? Authorizing Provider  ?amLODipine (NORVASC) 5 MG tablet Take 5 mg by mouth daily.   Yes [provider]  ?aspirin 81 MG tablet Take 81 mg by mouth daily.   Yes [provider]  ?benazepril (LOTENSIN) 20 MG tablet Take 20 mg by mouth at bedtime.   Yes [provider]  ?fluticasone (FLONASE) 50 MCG/ACT nasal spray Place 2 sprays into both nostrils daily.   Yes [provider]  ?gabapentin (NEURONTIN) 100 MG capsule Take 200 mg by mouth at bedtime.   Yes [provider]  ?meloxicam (MOBIC) 15 MG tablet Take 15 mg by mouth daily.   Yes [provider]  ?methimazole (TAPAZOLE) 5 MG tablet Take 5 mg by mouth daily.   Yes [provider]  ?omeprazole (PRILOSEC) 20 MG capsule Take 20 mg by mouth daily. 06/13/13  Yes [provider]  ?oxybutynin (DITROPAN-XL) 10 MG 24 hr tablet Take 10 mg by mouth at bedtime.   Yes [provider]  ?PARoxetine (PAXIL) 30 MG tablet Take 30 mg by mouth at bedtime.   Yes [provider]  ?amLODipine-benazepril (LOTREL) 5-20 MG capsule Take 1 capsule daily by mouth. 05/05/13   [provider]  ?ammonium lactate (AMLACTIN) 12 % cream Apply 1 g topically 2 (two) times daily. Apply to face    [provider]   ?atorvastatin (LIPITOR) 10 MG tablet Take 10 mg by mouth daily.    [provider]  ?chlorhexidine (PERIDEX) 0.12 % solution 15 ml    [provider]  ?Dextromethorphan-quiNIDine (NUEDEXTA) 20-10 MG capsule Take 1 tablet by mouth 2 (two) times daily.     [provider]  ?divalproex (DEPAKOTE) 125 MG DR tablet 1 tablet    [provider]  ?LORazepam (ATIVAN) 0.5 MG tablet Take by mouth. 12/30/20   [provider]  ?Multiple Vitamin (MULTIVITAMIN WITH MINERALS) TABS tablet Take 1 tablet by mouth daily.    [provider]  ?Polyethyl Glycol-Propyl Glycol (SYSTANE ULTRA) 0.4-0.3 % SOLN Place 1 drop into both eyes 2 (two) times daily.    [provider]  ?sodium chloride (MURO 128) 5 % ophthalmic solution Place 1 drop into both eyes 3 (three) times daily.    [provider]  ?sodium chloride 1 g tablet Take 1 g by mouth 3 (three) times a week. Monday, Wednesday, Friday    [provider]  ?SODIUM FLUORIDE, DENTAL RINSE, (PREVIDENT) 0.2 % SOLN Use as directed 1 application in the mouth or throat 2 (two) times daily.    [provider]  ?   ? ?Allergies    ?Patient has no known allergies.   ? ?Review of Systems   ?Review of Systems ? ?Physical Exam ?  Updated Vital Signs ?There were no vitals taken for this visit. ?Physical Exam ?Vitals and nursing note reviewed.  ?Constitutional:   ?   General: She is not in acute distress. ?   Appearance: She is well-developed. She is not diaphoretic.  ?   Comments: Chronically ill-appearing.  ?HENT:  ?   Head: Normocephalic and atraumatic.  ?Eyes:  ?   Pupils: Pupils are equal, round, and reactive to light.  ?Cardiovascular:  ?   Rate and Rhythm: Normal rate and regular rhythm.  ?   Heart sounds: No murmur heard. ?  No friction rub. No gallop.  ?Pulmonary:  ?   Effort: Pulmonary effort is normal.  ?   Breath sounds: No wheezing or rales.  ?Abdominal:  ?   General: There is no distension.  ?    Palpations: Abdomen is soft.  ?   Tenderness: There is no abdominal tenderness.  ?Musculoskeletal:     ?   General: No tenderness.  ?   Cervical back: Normal range of motion and neck supple.  ?Skin: ?   General: Skin is warm and dry.  ?Neurological:  ?   Mental Status: She is alert and oriented to person, place, and time.  ?Psychiatric:     ?   Behavior: Behavior normal.  ? ? ?ED Results / Procedures / Treatments   ?Labs ?(all labs ordered are listed, but only abnormal results are displayed) ?Labs Reviewed  ?CULTURE, BLOOD (ROUTINE X 2)  ?CULTURE, BLOOD (ROUTINE X 2)  ?URINE CULTURE  ?RESP PANEL BY RT-PCR (FLU A&B, COVID) ARPGX2  ?LACTIC ACID, PLASMA  ?LACTIC ACID, PLASMA  ?COMPREHENSIVE METABOLIC PANEL  ?CBC WITH DIFFERENTIAL/PLATELET  ?PROTIME-INR  ?APTT  ?URINALYSIS, ROUTINE W REFLEX MICROSCOPIC  ? ? ?EKG ?None ? ?Radiology ?DG Chest Port 1 View ? ?Result Date: 09/30/2021 ?CLINICAL DATA:  Questionable sepsis. Evaluate for abnormality. Cough and fever. EXAM: PORTABLE CHEST 1 VIEW COMPARISON:  05/05/2021 FINDINGS: Moderately decreased lung volumes again limit evaluation. Cardiac silhouette again appears moderately enlarged. Moderate calcification within aortic arch. There is again bibasilar bronchovascular crowding. No definite focal airspace opacity to indicate pneumonia. No definite pleural effusion. No pneumothorax is seen. No acute skeletal abnormality. IMPRESSION: Study again limited by hypoinflation. No definite change from prior. No definite acute lung process. Electronically Signed   By: Neita Garnet M.D.   On: 09/30/2021 13:14   ? ?Procedures ?Procedures  ? ? ?Medications Ordered in ED ?Medications  ?sodium chloride 0.9 % bolus 1,000 mL (has no administration in time range)  ?ipratropium-albuterol (DUONEB) 0.5-2.5 (3) MG/3ML nebulizer solution 3 mL (has no administration in time range)  ? ? ?ED Course/ Medical Decision Making/ A&P ?  ?                        ?Medical Decision Making ?Amount and/or  Complexity of Data Reviewed ?Labs: ordered. ?Radiology: ordered. ?ECG/medicine tests: ordered. ? ?Risk ?Prescription drug management. ? ? ?71 yo F with a chief complaints of fatigue.  This started acutely this morning.  She is noted to be tachypneic with EMS.  No obvious complaints on exam.  She does tells me that she is tired.  We will obtain a laboratory evaluation chest x-ray UA.  Was reportedly newly incontinent over the past couple weeks per EMS will obtain in and out. ? ?Awaiting labs and UA.  Signed out to Dr. Shyrl Numbers, please see their note for further details of care in the ED. ? ?The  patients results and plan were reviewed and discussed.   ?Any x-rays performed were independently reviewed by myself.  ? ?Differential diagnosis were considered with the presenting HPI. ? ?Medications  ?ipratropium-albuterol (DUONEB) 0.5-2.5 (3) MG/3ML nebulizer solution 3 mL (has no administration in time range)  ?ipratropium-albuterol (DUONEB) 0.5-2.5 (3) MG/3ML nebulizer solution 3 mL (3 mLs Nebulization Given 09/30/21 1629)  ?sodium chloride 0.9 % bolus 1,000 mL (0 mLs Intravenous Stopped 09/30/21 1739)  ?cefTRIAXone (ROCEPHIN) 1 g in sodium chloride 0.9 % 100 mL IVPB (0 g Intravenous Stopped 09/30/21 1739)  ? ? ?Vitals:  ? 10/01/21 0200 10/01/21 0400 10/01/21 0452 10/01/21 0600  ?BP: (!) 173/86 (!) 176/95 (!) 176/95 130/78  ?Pulse: 97 76 81 76  ?Resp: 16  16   ?Temp:      ?TempSrc:      ?SpO2: 93% 91% 93% 92%  ? ? ?Final diagnoses:  ?Acute cystitis without hematuria  ? ? ? ? ? ? ? ? ? ? ?Final Clinical Impression(s) / ED Diagnoses ?Final diagnoses:  ?None  ? ? ?Rx / DC Orders ?ED Discharge Orders   ? ? None  ? ?  ? ? ?  ?Melene Plan, DO ?10/04/21 1556 ? ?

## 2021-10-01 ENCOUNTER — Telehealth (HOSPITAL_COMMUNITY): Payer: Self-pay | Admitting: Emergency Medicine

## 2021-10-01 MED ORDER — CEPHALEXIN 250 MG PO CAPS: 250.0000 mg | ORAL_CAPSULE | Freq: Four times a day (QID) | ORAL | 0 refills | Status: DC

## 2021-10-01 NOTE — ED Notes (Signed)
Attempted to call [facility] at [phone].  Phone was answered by staff member that was unable to take report.  Made aware that patient was returning to facility. ?

## 2021-10-01 NOTE — ED Provider Notes (Signed)
10:30 AM-call from her facility to inquire about necessity for antibiotic prescription.  She was treated for UTI with a dose of Rocephin yesterday evening, prior to discharge early this morning, after transportation arrived.  Review of record indicates likely UTI.  Urine culture pending no growth yet.  Will prescribe Keflex 250 mg 4 times daily x5 days. ?  ?Daleen Bo, MD ?10/01/21 1040 ? ?

## 2021-10-01 NOTE — ED Provider Notes (Signed)
Send prescription for Keflex to the patient's pharmacy of choice. ?  ?Mancel Bale, MD ?10/01/21 1057 ? ?

## 2021-10-01 NOTE — ED Notes (Signed)
Patient repositioned for comfort.  External catheter changed.  Patient found to be incontinent around external catheter.  Sheets and blankets changed.  Patient provided with additional pillows under knees and arms to prevent pressure on heels and elbows.  Warm blankets provided  ?

## 2021-10-01 NOTE — ED Notes (Signed)
Patient resting quietly in bed.  No reports of pain or discomfort.  Refused warm blanket at this time.   ?

## 2021-10-01 NOTE — ED Notes (Signed)
Attempted to call number back like instructed.  No answer from facility ?

## 2021-10-03 LAB — URINE CULTURE: Culture: >=100000 — AB

## 2021-10-04 ENCOUNTER — Telehealth: Payer: Self-pay

## 2021-10-04 NOTE — Telephone Encounter (Signed)
Post ED Visit - Positive Culture Follow-up ? ?Culture report reviewed by antimicrobial stewardship pharmacist: ?[place] Team ?[]  Elenor Quinones, Pharm.D. ?[]  Heide Guile, Pharm.D., BCPS AQ-ID ?[]  Parks Neptune, Pharm.D., BCPS ?[x]  Alycia Rossetti, Pharm.D., BCPS ?[]  [place], Pharm.D., BCPS, AAHIVP ?[]  Legrand Como, Pharm.D., BCPS, AAHIVP ?[]  Salome Arnt, PharmD, BCPS ?[]  Johnnette Gourd, PharmD, BCPS ?[]  Hughes Better, PharmD, BCPS ?[]  Leeroy Cha, PharmD ?[]  Laqueta Linden, PharmD, BCPS ?[]  Albertina Parr, PharmD ? ?[place] Team ?[]  Leodis Sias, PharmD ?[]  Lindell Spar, PharmD ?[]  Royetta Asal, PharmD ?[]  Graylin Shiver, Rph ?[]  Rema Fendt) Glennon Mac, PharmD ?[]  Arlyn Dunning, PharmD ?[]  Netta Cedars, PharmD ?[]  Dia Sitter, PharmD ?[]  Leone Haven, PharmD ?[]  Gretta Arab, PharmD ?[]  Theodis Shove, PharmD ?[]  Peggyann Juba, PharmD ?[]  Reuel Boom, PharmD ? ? ?Positive urine culture ?Treated with Keflex, organism sensitive to the same and no further patient follow-up is required at this time. ? ?Glennon Hamilton ?10/04/2021, 9:32 AM ?  ?

## 2021-10-05 LAB — CULTURE, BLOOD (ROUTINE X 2)
Culture: NO GROWTH
Culture: NO GROWTH
Special Requests: ADEQUATE
Special Requests: ADEQUATE

## 2021-10-20 ENCOUNTER — Emergency Department (HOSPITAL_COMMUNITY): Payer: Medicare Other

## 2021-10-20 ENCOUNTER — Encounter (HOSPITAL_COMMUNITY): Payer: Self-pay

## 2021-10-20 ENCOUNTER — Inpatient Hospital Stay (HOSPITAL_COMMUNITY)
Admission: EM | Admit: 2021-10-20 | Discharge: 2021-10-24 | DRG: 689 | Disposition: A | Discharge status: Nursing Home (Includes ICF) | Payer: Medicare Other | Source: Skilled Nursing Facility | Attending: Internal Medicine | Admitting: Internal Medicine

## 2021-10-20 ENCOUNTER — Other Ambulatory Visit: Payer: Self-pay

## 2021-10-20 DIAGNOSIS — F0154 Vascular dementia, unspecified severity, with anxiety: Secondary | ICD-10-CM | POA: Diagnosis present

## 2021-10-20 DIAGNOSIS — Z66 Do not resuscitate: Secondary | ICD-10-CM | POA: Diagnosis present

## 2021-10-20 DIAGNOSIS — J449 Chronic obstructive pulmonary disease, unspecified: Secondary | ICD-10-CM

## 2021-10-20 DIAGNOSIS — Z87440 Personal history of urinary (tract) infections: Secondary | ICD-10-CM

## 2021-10-20 DIAGNOSIS — F0150 Vascular dementia without behavioral disturbance: Secondary | ICD-10-CM

## 2021-10-20 DIAGNOSIS — I10 Essential (primary) hypertension: Secondary | ICD-10-CM

## 2021-10-20 DIAGNOSIS — J9611 Chronic respiratory failure with hypoxia: Secondary | ICD-10-CM

## 2021-10-20 DIAGNOSIS — E876 Hypokalemia: Secondary | ICD-10-CM

## 2021-10-20 DIAGNOSIS — N390 Urinary tract infection, site not specified: Principal | ICD-10-CM | POA: Diagnosis present

## 2021-10-20 DIAGNOSIS — Z79899 Other long term (current) drug therapy: Secondary | ICD-10-CM

## 2021-10-20 DIAGNOSIS — Z7982 Long term (current) use of aspirin: Secondary | ICD-10-CM

## 2021-10-20 DIAGNOSIS — E162 Hypoglycemia, unspecified: Secondary | ICD-10-CM | POA: Diagnosis present

## 2021-10-20 DIAGNOSIS — E0590 Thyrotoxicosis, unspecified without thyrotoxic crisis or storm: Secondary | ICD-10-CM

## 2021-10-20 DIAGNOSIS — E785 Hyperlipidemia, unspecified: Secondary | ICD-10-CM | POA: Diagnosis present

## 2021-10-20 DIAGNOSIS — R4182 Altered mental status, unspecified: Secondary | ICD-10-CM | POA: Diagnosis not present

## 2021-10-20 DIAGNOSIS — F70 Mild intellectual disabilities: Secondary | ICD-10-CM | POA: Diagnosis present

## 2021-10-20 DIAGNOSIS — Z803 Family history of malignant neoplasm of breast: Secondary | ICD-10-CM

## 2021-10-20 DIAGNOSIS — F0153 Vascular dementia, unspecified severity, with mood disturbance: Secondary | ICD-10-CM | POA: Diagnosis present

## 2021-10-20 DIAGNOSIS — R400 Somnolence: Secondary | ICD-10-CM | POA: Diagnosis not present

## 2021-10-20 DIAGNOSIS — M810 Age-related osteoporosis without current pathological fracture: Secondary | ICD-10-CM | POA: Diagnosis present

## 2021-10-20 DIAGNOSIS — Z90710 Acquired absence of both cervix and uterus: Secondary | ICD-10-CM

## 2021-10-20 DIAGNOSIS — Z8249 Family history of ischemic heart disease and other diseases of the circulatory system: Secondary | ICD-10-CM

## 2021-10-20 DIAGNOSIS — G928 Other toxic encephalopathy: Secondary | ICD-10-CM | POA: Diagnosis present

## 2021-10-20 DIAGNOSIS — G929 Unspecified toxic encephalopathy: Secondary | ICD-10-CM | POA: Diagnosis present

## 2021-10-20 DIAGNOSIS — G9340 Encephalopathy, unspecified: Secondary | ICD-10-CM

## 2021-10-20 LAB — COMPREHENSIVE METABOLIC PANEL
ALT: 15 U/L (ref 0–44)
AST: 17 U/L (ref 15–41)
Albumin: 2.9 g/dL — ABNORMAL LOW (ref 3.5–5.0)
Alkaline Phosphatase: 73 U/L (ref 38–126)
Anion gap: 6 (ref 5–15)
BUN: 26 mg/dL — ABNORMAL HIGH (ref 8–23)
CO2: 27 mmol/L (ref 22–32)
Calcium: 8.7 mg/dL — ABNORMAL LOW (ref 8.9–10.3)
Chloride: 111 mmol/L (ref 98–111)
Creatinine, Ser: 1.06 mg/dL — ABNORMAL HIGH (ref 0.44–1.00)
GFR, Estimated: 56 mL/min — ABNORMAL LOW (ref >60)
Glucose, Bld: 84 mg/dL (ref 70–99)
Potassium: 4.2 mmol/L (ref 3.5–5.1)
Sodium: 144 mmol/L (ref 135–145)
Total Bilirubin: 0.8 mg/dL (ref 0.3–1.2)
Total Protein: 6.2 g/dL — ABNORMAL LOW (ref 6.5–8.1)

## 2021-10-20 LAB — I-STAT VENOUS BLOOD GAS, ED
Acid-Base Excess: 3 mmol/L — ABNORMAL HIGH (ref 0.0–2.0)
Bicarbonate: 28.9 mmol/L — ABNORMAL HIGH (ref 20.0–28.0)
Calcium, Ion: 1.04 mmol/L — ABNORMAL LOW (ref 1.15–1.40)
HCT: 37 % (ref 36.0–46.0)
Hemoglobin: 12.6 g/dL (ref 12.0–15.0)
O2 Saturation: 99 %
Potassium: 4.2 mmol/L (ref 3.5–5.1)
Sodium: 143 mmol/L (ref 135–145)
TCO2: 30 mmol/L (ref 22–32)
pCO2, Ven: 46.3 mmHg (ref 44–60)
pH, Ven: 7.404 (ref 7.25–7.43)
pO2, Ven: 140 mmHg — ABNORMAL HIGH (ref 32–45)

## 2021-10-20 LAB — CBC WITH DIFFERENTIAL/PLATELET
Abs Immature Granulocytes: 0.02 10*3/uL (ref 0.00–0.07)
Basophils Absolute: 0 10*3/uL (ref 0.0–0.1)
Basophils Relative: 0 %
Eosinophils Absolute: 0 10*3/uL (ref 0.0–0.5)
Eosinophils Relative: 1 %
HCT: 42.1 % (ref 36.0–46.0)
Hemoglobin: 12.6 g/dL (ref 12.0–15.0)
Immature Granulocytes: 0 %
Lymphocytes Relative: 23 %
Lymphs Abs: 1.5 10*3/uL (ref 0.7–4.0)
MCH: 31 pg (ref 26.0–34.0)
MCHC: 29.9 g/dL — ABNORMAL LOW (ref 30.0–36.0)
MCV: 103.7 fL — ABNORMAL HIGH (ref 80.0–100.0)
Monocytes Absolute: 0.7 10*3/uL (ref 0.1–1.0)
Monocytes Relative: 11 %
Neutro Abs: 4.3 10*3/uL (ref 1.7–7.7)
Neutrophils Relative %: 65 %
Platelets: 248 10*3/uL (ref 150–400)
RBC: 4.06 MIL/uL (ref 3.87–5.11)
RDW: 14.3 % (ref 11.5–15.5)
WBC: 6.7 10*3/uL (ref 4.0–10.5)
nRBC: 0 % (ref 0.0–0.2)

## 2021-10-20 LAB — URINALYSIS, ROUTINE W REFLEX MICROSCOPIC
Bilirubin Urine: NEGATIVE
Glucose, UA: NEGATIVE mg/dL
Ketones, ur: NEGATIVE mg/dL
Nitrite: POSITIVE — AB
Protein, ur: 100 mg/dL — AB
RBC / HPF: >50 RBC/hpf — ABNORMAL HIGH (ref 0–5)
Specific Gravity, Urine: 1.018 (ref 1.005–1.030)
WBC, UA: >50 WBC/hpf — ABNORMAL HIGH (ref 0–5)
pH: 5 (ref 5.0–8.0)

## 2021-10-20 LAB — ETHANOL: Alcohol, Ethyl (B): <10 mg/dL (ref <10)

## 2021-10-20 LAB — LACTIC ACID, PLASMA
Lactic Acid, Venous: 1.2 mmol/L (ref 0.5–1.9)
Lactic Acid, Venous: 1.2 mmol/L (ref 0.5–1.9)

## 2021-10-20 MED ORDER — ADULT MULTIVITAMIN W/MINERALS CH
1.0000 | ORAL_TABLET | Freq: Every day | ORAL | Status: DC
  Administered 2021-10-23 – 2021-10-24 (×2): 1 via ORAL
  Filled 2021-10-20 (×3): qty 1

## 2021-10-20 MED ORDER — SODIUM CHLORIDE (HYPERTONIC) 5 % OP SOLN
1.0000 [drp] | Freq: Three times a day (TID) | OPHTHALMIC | Status: DC
  Administered 2021-10-21 – 2021-10-24 (×8): 1 [drp] via OPHTHALMIC
  Filled 2021-10-20: qty 15

## 2021-10-20 MED ORDER — PANTOPRAZOLE SODIUM 40 MG PO TBEC
40.0000 mg | DELAYED_RELEASE_TABLET | Freq: Every day | ORAL | Status: DC
Start: 2021-10-21 — End: 2021-10-24
  Administered 2021-10-21 – 2021-10-24 (×3): 40 mg via ORAL
  Filled 2021-10-20 (×4): qty 1

## 2021-10-20 MED ORDER — SODIUM CHLORIDE 0.9% FLUSH: 10.0000 mL | INTRAVENOUS | Status: DC | PRN

## 2021-10-20 MED ORDER — RISPERIDONE 0.5 MG PO TABS
0.5000 mg | ORAL_TABLET | Freq: Two times a day (BID) | ORAL | Status: DC
Start: 2021-10-20 — End: 2021-10-24
  Administered 2021-10-20 – 2021-10-24 (×6): 0.5 mg via ORAL
  Filled 2021-10-20 (×8): qty 1

## 2021-10-20 MED ORDER — METHIMAZOLE 5 MG PO TABS
5.0000 mg | ORAL_TABLET | Freq: Every day | ORAL | Status: DC
Start: 2021-10-21 — End: 2021-10-24
  Administered 2021-10-21 – 2021-10-24 (×3): 5 mg via ORAL
  Filled 2021-10-20 (×4): qty 1

## 2021-10-20 MED ORDER — SODIUM CHLORIDE 0.9% FLUSH
10.0000 mL | Freq: Two times a day (BID) | INTRAVENOUS | Status: DC
  Administered 2021-10-20 – 2021-10-24 (×5): 10 mL

## 2021-10-20 MED ORDER — SODIUM CHLORIDE 1 G PO TABS
1.0000 g | ORAL_TABLET | ORAL | Status: DC
  Administered 2021-10-21 – 2021-10-24 (×2): 1 g via ORAL
  Filled 2021-10-20 (×2): qty 1

## 2021-10-20 MED ORDER — RISPERIDONE 0.5 MG PO TABS
1.0000 mg | ORAL_TABLET | Freq: Every day | ORAL | Status: DC
  Administered 2021-10-21 – 2021-10-23 (×3): 1 mg via ORAL
  Filled 2021-10-20 (×3): qty 2
  Filled 2021-10-20: qty 1

## 2021-10-20 MED ORDER — ASPIRIN EC 81 MG PO TBEC
81.0000 mg | DELAYED_RELEASE_TABLET | Freq: Every day | ORAL | Status: DC
  Administered 2021-10-21 – 2021-10-24 (×3): 81 mg via ORAL
  Filled 2021-10-20 (×4): qty 1

## 2021-10-20 MED ORDER — HEPARIN SODIUM (PORCINE) 5000 UNIT/ML IJ SOLN
5000.0000 [IU] | Freq: Two times a day (BID) | INTRAMUSCULAR | Status: DC
  Administered 2021-10-20 – 2021-10-24 (×7): 5000 [IU] via SUBCUTANEOUS
  Filled 2021-10-20 (×8): qty 1

## 2021-10-20 MED ORDER — LORAZEPAM 0.5 MG PO TABS: 0.5000 mg | ORAL_TABLET | Freq: Every day | ORAL | Status: DC | PRN

## 2021-10-20 MED ORDER — SODIUM CHLORIDE 0.9 % IV SOLN
1.0000 g | Freq: Once | INTRAVENOUS | Status: AC
Start: 2021-10-20 — End: 2021-10-20
  Administered 2021-10-20: 1 g via INTRAVENOUS
  Filled 2021-10-20: qty 10

## 2021-10-20 MED ORDER — MELOXICAM 7.5 MG PO TABS
15.0000 mg | ORAL_TABLET | Freq: Every day | ORAL | Status: DC
  Administered 2021-10-21 – 2021-10-24 (×3): 15 mg via ORAL
  Filled 2021-10-20 (×4): qty 2

## 2021-10-20 MED ORDER — AMLODIPINE BESYLATE 5 MG PO TABS
5.0000 mg | ORAL_TABLET | Freq: Every day | ORAL | Status: DC
  Administered 2021-10-21 – 2021-10-24 (×3): 5 mg via ORAL
  Filled 2021-10-20 (×4): qty 1

## 2021-10-20 MED ORDER — CHLORHEXIDINE GLUCONATE 0.12 % MT SOLN
15.0000 mL | Freq: Two times a day (BID) | OROMUCOSAL | Status: DC
  Administered 2021-10-20 – 2021-10-24 (×7): 15 mL via OROMUCOSAL
  Filled 2021-10-20 (×10): qty 15

## 2021-10-20 MED ORDER — OXYBUTYNIN CHLORIDE ER 5 MG PO TB24
10.0000 mg | ORAL_TABLET | Freq: Every day | ORAL | Status: DC
  Administered 2021-10-21 – 2021-10-23 (×3): 10 mg via ORAL
  Filled 2021-10-20 (×3): qty 2
  Filled 2021-10-20: qty 1

## 2021-10-20 MED ORDER — BENAZEPRIL HCL 20 MG PO TABS
20.0000 mg | ORAL_TABLET | Freq: Every day | ORAL | Status: DC
  Administered 2021-10-21 – 2021-10-23 (×3): 20 mg via ORAL
  Filled 2021-10-20 (×4): qty 1

## 2021-10-20 MED ORDER — GABAPENTIN 100 MG PO CAPS
200.0000 mg | ORAL_CAPSULE | Freq: Every day | ORAL | Status: DC
  Administered 2021-10-21 – 2021-10-23 (×3): 200 mg via ORAL
  Filled 2021-10-20 (×3): qty 2

## 2021-10-20 MED ORDER — FLUTICASONE PROPIONATE 50 MCG/ACT NA SUSP
2.0000 | Freq: Every day | NASAL | Status: DC
  Administered 2021-10-23 – 2021-10-24 (×2): 2 via NASAL
  Filled 2021-10-20: qty 16

## 2021-10-20 MED ORDER — PAROXETINE HCL 30 MG PO TABS
30.0000 mg | ORAL_TABLET | Freq: Every day | ORAL | Status: DC
  Administered 2021-10-21 – 2021-10-23 (×3): 30 mg via ORAL
  Filled 2021-10-20 (×5): qty 1

## 2021-10-20 MED ORDER — DIVALPROEX SODIUM 125 MG PO DR TAB
125.0000 mg | DELAYED_RELEASE_TABLET | Freq: Two times a day (BID) | ORAL | Status: DC
  Administered 2021-10-21 – 2021-10-24 (×6): 125 mg via ORAL
  Filled 2021-10-20 (×9): qty 1

## 2021-10-20 NOTE — ED Triage Notes (Signed)
Pt BIB [facility] from [facility] c/o AMS and lethargy. Pt had a similar episode a couple weeks ago and was diagnosed with a UTI. Pt is normally alert but does have learning disabilities. Pt is on 2L [place] at baseline.  ?

## 2021-10-20 NOTE — ED Provider Notes (Signed)
?[facility] EMERGENCY DEPARTMENT ?Provider Note ? ? ?CSN: [id number] ?Arrival date & time: 10/20/21  1042 ? ?  ? ?History ? ?Chief Complaint  ?Patient presents with  ? Altered Mental Status  ? ? ?Amy Solis is a 71 y.o. female. ? 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 Last known normal is unknown. Patient presents from nursing facility, [facility] with concern AMS and lethargy. Patient denies pain or symptoms of illness; frequently falls asleep during conversation but is alert and able to answer questions intermittently.  ? ? ?  ? ?Home Medications ?Prior to Admission medications   ?Medication Sig Start Date End Date Taking? Authorizing Provider  ?amLODipine (NORVASC) 5 MG tablet Take 5 mg by mouth daily.    [provider]  ?aspirin 81 MG tablet Take 81 mg by mouth daily.    [provider]  ?benazepril (LOTENSIN) 20 MG tablet Take 20 mg by mouth at bedtime.    [provider]  ?cephALEXin (KEFLEX) 250 MG capsule Take 1 capsule (250 mg total) by mouth 4 (four) times daily. 10/01/21   Mancel Bale, MD  ?chlorhexidine (PERIDEX) 0.12 % solution Use as directed 15 mLs in the mouth or throat 2 (two) times daily.    [provider]  ?divalproex (DEPAKOTE) 125 MG DR tablet Take 125 mg by mouth 2 (two) times daily.    [provider]  ?fluticasone (FLONASE) 50 MCG/ACT nasal spray Place 2 sprays into both nostrils daily.    [provider]  ?gabapentin (NEURONTIN) 100 MG capsule Take 200 mg by mouth at bedtime.    [provider]  ?LORazepam (ATIVAN) 0.5 MG tablet Take 0.5 mg by mouth daily as needed for anxiety. 12/30/20   [provider]  ?meloxicam (MOBIC) 15 MG tablet Take 15 mg by mouth daily.    [provider]  ?methimazole (TAPAZOLE) 5 MG tablet Take 5 mg by mouth daily.    [provider]  ?Multiple Vitamin  (MULTIVITAMIN WITH MINERALS) TABS tablet Take 1 tablet by mouth daily.    [provider]  ?omeprazole (PRILOSEC) 20 MG capsule Take 20 mg by mouth daily. 06/13/13   [provider]  ?oxybutynin (DITROPAN-XL) 10 MG 24 hr tablet Take 10 mg by mouth at bedtime.    [provider]  ?PARoxetine (PAXIL) 30 MG tablet Take 30 mg by mouth at bedtime.    [provider]  ?Polyethyl Glycol-Propyl Glycol (SYSTANE ULTRA) 0.4-0.3 % SOLN Place 1 drop into both eyes 2 (two) times daily.    [provider]  ?risperiDONE (RISPERDAL) 0.5 MG tablet Take 0.5 mg by mouth 2 (two) times daily. Also takes 1mg  dose at bedtime    [provider]  ?risperiDONE (RISPERDAL) 1 MG tablet Take 1 mg by mouth at bedtime. Also takes 0.5mg  dose twice daily    [provider]  ?sodium chloride (MURO 128) 5 % ophthalmic solution Place 1 drop into both eyes 3 (three) times daily.    [provider]  ?sodium chloride 1 g tablet Take 1 g by mouth every Monday, Wednesday, and Friday.    [provider]  ?   ? ?Allergies    ?Patient has no known allergies.   ? ?Review of Systems   ?Review of Systems  ?Reason unable to perform ROS: AMS.  ? ?Physical Exam ?Updated Vital Signs ?BP (!) 143/73   Pulse  64   Resp 14   SpO2 98%  ?Physical Exam ?Constitutional:   ?   General: She is not in acute distress. ?   Comments: Somnolent, awakens to voice easily   ?HENT:  ?   Nose: Nose normal.  ?   Mouth/Throat:  ?   Mouth: Mucous membranes are moist.  ?Eyes:  ?   General:     ?   Right eye: No discharge.     ?   Left eye: No discharge.  ?   Extraocular Movements: Extraocular movements intact.  ?   Conjunctiva/sclera: Conjunctivae normal.  ?   Comments: Pupils sluggishly react to light   ?Cardiovascular:  ?   Rate and Rhythm: Normal rate and regular rhythm.  ?   Heart sounds: Normal heart sounds.  ?Pulmonary:  ?   Effort: Pulmonary effort is normal.  ?Abdominal:  ?   Palpations: Abdomen is  soft.  ?   Tenderness: There is no abdominal tenderness.  ?Skin: ?   General: Skin is warm and dry.  ?   Findings: No rash.  ?Neurological:  ?   Mental Status: She is disoriented.  ?   Cranial Nerves: No cranial nerve deficit.  ?   Comments: Oriented to self and home location only  ?Follows commands  ?Moves all extremities against gravity   ? ? ?ED Results / Procedures / Treatments   ?Labs ?(all labs ordered are listed, but only abnormal results are displayed) ?Labs Reviewed  ?CULTURE, BLOOD (ROUTINE X 2)  ?CULTURE, BLOOD (ROUTINE X 2)  ?URINE CULTURE  ?COMPREHENSIVE METABOLIC PANEL  ?LACTIC ACID, PLASMA  ?LACTIC ACID, PLASMA  ?CBC WITH DIFFERENTIAL/PLATELET  ?URINALYSIS, ROUTINE W REFLEX MICROSCOPIC  ?ETHANOL  ?I-STAT VENOUS BLOOD GAS, ED  ? ? ?EKG ?None ? ?Radiology ?No results found. ? ?Procedures ?Procedures  ? ?Medications Ordered in ED ?Medications - No data to display ? ?ED Course/ Medical Decision Making/ A&P ?  ?                        ?Medical Decision Making ?Patient is a 71 year old female with hx of recently treated UTI, chronic respiratory failure on supplemental oxygen and pertinent social hx noting residence in [facility] nursing facility who presents via EMS for AMS. Patient was noted to have UTI on 3/17 for which she was treated with abx. Unknown baseline upon my initial evaluation.Patient is intermittently alert and able to answer questions as well as follow commands with GCS of 14. Patient is only oriented to self and NF name. I have contacted the patient's facility with no answer as of 1200 to obtain more information regarding patient's baseline. Contact information was left via VM. Initial VS without fever, tachycardia or desaturation.  ?Differential for patient's onset of AMS is broad and includes metabolic derangements, sepsis (given recent UTI), acute CVA, new onset seizure activity,medication (med list includes benzodiazepine),intoxication, cranial/intracranial bleed and hypercarbia  given hx of chronic respiratory failure. Will obtain lab work and imaging to evaluate for potential causes as listed above.  ? ? ? ?Final Clinical Impression(s) / ED Diagnoses ?Final diagnoses:  ?Somnolence  ? ? ?Rx / DC Orders ?ED Discharge Orders   ? ? None  ? ?  ? ? ?  ?Ronnald Ramp, MD ?10/20/21 1210 ? ?  ?Blane Ohara, MD ?10/20/21 1343 ? ?

## 2021-10-20 NOTE — H&P (Signed)
?History and Physical  ? ? ?Amy RossettiSusan F Solis ZOX:[id number] DOB: December 28, 1950 DOA: 10/20/2021 ? ?PCP: Florentina Jennyripp, Henry, MD (Confirm with patient/family/NH records and if not entered, this has to be entered at [facility] point of entry) ?Patient coming from: Nursing home ? ?I have personally briefly reviewed patient's [facility] ? ?Chief Complaint: I feel fine ? ?HPI: Amy Solis is a 71 y.o. female with medical history significant of advanced dementia, anxiety/depression, HTN, chronic hypoxic respiratory failure on 2 L continuously, hyperthyroidism, sent from nursing home for altered mentations. ? ?Patient has advanced dementia but at baseline, she responds to nursing home staff well.  Today, it was noticed that the patient was very lethargic and more confused.  About 3 weeks ago, patient had a similar presentation, when it was found patient had a UTI for which patient received a total of 7 days of Keflex treatment.  No documented fever. ? ?ED Course: Patient is afebrile, no tachycardia no hypotension. ? ?WBC 6.7, ABG seven-point 4/46/140.  UA showed large leukocyte, WBC> 50. ? ?Review of Systems: Unable to perform, patient baseline dementia. ? ?Past Medical History:  ?Diagnosis Date  ? Depressive disorder   ? Hyperlipemia   ? Hypertension   ? Mild mental retardation   ? Osteoporosis   ? ? ?Past Surgical History:  ?Procedure Laterality Date  ? ABDOMINAL HYSTERECTOMY    ? ? ? reports that she has never smoked. She has never used smokeless tobacco. She reports that she does not drink alcohol and does not use drugs. ? ?No Known Allergies ? ?Family History  ?Problem Relation Age of Onset  ? Breast cancer Mother   ?     diagnosed in her 280's  ? Heart attack Father   ? Heart attack Brother   ? ? ? ?Prior to Admission medications   ?Medication Sig Start Date End Date Taking? Authorizing Provider  ?amLODipine (NORVASC) 5 MG tablet Take 5 mg by mouth daily.   Yes [provider]  ?ammonium lactate  (LAC-HYDRIN) 12 % lotion Apply 1 application. topically at bedtime.   Yes [provider]  ?aspirin 81 MG tablet Take 81 mg by mouth daily.   Yes [provider]  ?benazepril (LOTENSIN) 20 MG tablet Take 20 mg by mouth at bedtime.   Yes [provider]  ?chlorhexidine (PERIDEX) 0.12 % solution Use as directed 15 mLs in the mouth or throat 2 (two) times daily.   Yes [provider]  ?divalproex (DEPAKOTE) 125 MG DR tablet Take 125 mg by mouth 2 (two) times daily.   Yes [provider]  ?fluticasone (FLONASE) 50 MCG/ACT nasal spray Place 2 sprays into both nostrils daily.   Yes [provider]  ?gabapentin (NEURONTIN) 100 MG capsule Take 200 mg by mouth at bedtime.   Yes [provider]  ?LORazepam (ATIVAN) 0.5 MG tablet Take 0.5 mg by mouth daily as needed for anxiety. 12/30/20  Yes [provider]  ?meloxicam (MOBIC) 15 MG tablet Take 15 mg by mouth daily.   Yes [provider]  ?methimazole (TAPAZOLE) 5 MG tablet Take 5 mg by mouth daily.   Yes [provider]  ?Multiple Vitamin (MULTIVITAMIN WITH MINERALS) TABS tablet Take 1 tablet by mouth daily.   Yes [provider]  ?omeprazole (PRILOSEC) 20 MG capsule Take 20 mg by mouth daily. 06/13/13  Yes [provider]  ?oxybutynin (DITROPAN-XL) 10 MG 24 hr tablet Take 10 mg by mouth at bedtime.  Yes [provider]  ?PARoxetine (PAXIL) 30 MG tablet Take 30 mg by mouth at bedtime.   Yes [provider]  ?risperiDONE (RISPERDAL) 0.5 MG tablet Take 0.5 mg by mouth 2 (two) times daily. Also takes 1mg  dose at bedtime   Yes [provider]  ?risperiDONE (RISPERDAL) 1 MG tablet Take 1 mg by mouth at bedtime. Also takes 0.5mg  dose twice daily   Yes [provider]  ?sodium chloride (MURO 128) 5 % ophthalmic solution Place 1 drop into both eyes 3 (three) times daily.   Yes [provider]  ?sodium chloride 1 g tablet Take 1 g by  mouth every Monday, Wednesday, and Friday.   Yes [provider]  ?cephALEXin (KEFLEX) 250 MG capsule Take 1 capsule (250 mg total) by mouth 4 (four) times daily. ?Patient not taking: Reported on 10/20/2021 10/01/21   10/03/21, MD  ? ? ?Physical Exam: ?Vitals:  ? 10/20/21 1115 10/20/21 1200 10/20/21 1215 10/20/21 1315  ?BP: (!) 143/73 131/80 (!) 145/81 130/71  ?Pulse: 64 75 70 72  ?Resp: 14 19 16 20   ?Temp:    98.2 ?F (36.8 ?C)  ?TempSrc:    Rectal  ?SpO2: 98% 98% 95% 97%  ? ? ?Constitutional: NAD, calm, comfortable ?Vitals:  ? 10/20/21 1115 10/20/21 1200 10/20/21 1215 10/20/21 1315  ?BP: (!) 143/73 131/80 (!) 145/81 130/71  ?Pulse: 64 75 70 72  ?Resp: 14 19 16 20   ?Temp:    98.2 ?F (36.8 ?C)  ?TempSrc:    Rectal  ?SpO2: 98% 98% 95% 97%  ? ?Eyes: PERRL, lids and conjunctivae normal ?ENMT: Mucous membranes are moist. Posterior pharynx clear of any exudate or lesions.Normal dentition.  ?Neck: normal, supple, no masses, no thyromegaly ?Respiratory: clear to auscultation bilaterally, no wheezing, no crackles. Normal respiratory effort. No accessory muscle use.  ?Cardiovascular: Regular rate and rhythm, no murmurs / rubs / gallops. No extremity edema. 2+ pedal pulses. No carotid bruits.  ?Abdomen: no tenderness, no masses palpated. No hepatosplenomegaly. Bowel sounds positive.  ?Musculoskeletal: no clubbing / cyanosis. No joint deformity upper and lower extremities. Good ROM, no contractures. Normal muscle tone.  ?Skin: no rashes, lesions, ulcers. No induration ?Neurologic: CN 2-12 grossly intact. Sensation intact, DTR normal. Strength 5/5 in all 4.  ?Psychiatric: Awake alert oriented to herself, confused about time and place. ? ? ?Labs on Admission: I have personally reviewed following labs and imaging studies ? ?CBC: ?Recent Labs  ?Lab 10/20/21 ?1305 10/20/21 ?1340  ?WBC 6.7  --   ?NEUTROABS 4.3  --   ?HGB 12.6 12.6  ?HCT 42.1 37.0  ?MCV 103.7*  --   ?PLT 248  --   ? ?Basic Metabolic Panel: ?Recent Labs   ?Lab 10/20/21 ?1305 10/20/21 ?1340  ?NA 144 143  ?K 4.2 4.2  ?CL 111  --   ?CO2 27  --   ?GLUCOSE 84  --   ?BUN 26*  --   ?CREATININE 1.06*  --   ?CALCIUM 8.7*  --   ? ?GFR: ?CrCl cannot be calculated (Unknown ideal weight.). ?Liver Function Tests: ?Recent Labs  ?Lab 10/20/21 ?1305  ?AST 17  ?ALT 15  ?ALKPHOS 73  ?BILITOT 0.8  ?PROT 6.2*  ?ALBUMIN 2.9*  ? ?No results for input(s): LIPASE, AMYLASE in the last 168 hours. ?No results for input(s): AMMONIA in the last 168 hours. ?Coagulation Profile: ?No results for input(s): INR, PROTIME in the last 168 hours. ?Cardiac Enzymes: ?No results for input(s): CKTOTAL, CKMB, CKMBINDEX, TROPONINI in  the last 168 hours. ?BNP (last 3 results) ?Recent Labs  ?  05/05/21 ?1441  ?PROBNP 24.0  ? ?HbA1C: ?No results for input(s): HGBA1C in the last 72 hours. ?CBG: ?No results for input(s): GLUCAP in the last 168 hours. ?Lipid Profile: ?No results for input(s): CHOL, HDL, LDLCALC, TRIG, CHOLHDL, LDLDIRECT in the last 72 hours. ?Thyroid Function Tests: ?No results for input(s): TSH, T4TOTAL, FREET4, T3FREE, THYROIDAB in the last 72 hours. ?Anemia Panel: ?No results for input(s): VITAMINB12, FOLATE, FERRITIN, TIBC, IRON, RETICCTPCT in the last 72 hours. ?Urine analysis: ?   ?Component Value Date/Time  ? COLORURINE YELLOW 10/20/2021 1254  ? APPEARANCEUR TURBID (A) 10/20/2021 1254  ? LABSPEC 1.018 10/20/2021 1254  ? PHURINE 5.0 10/20/2021 1254  ? GLUCOSEU NEGATIVE 10/20/2021 1254  ? HGBUR LARGE (A) 10/20/2021 1254  ? BILIRUBINUR NEGATIVE 10/20/2021 1254  ? KETONESUR NEGATIVE 10/20/2021 1254  ? PROTEINUR 100 (A) 10/20/2021 1254  ? NITRITE POSITIVE (A) 10/20/2021 1254  ? LEUKOCYTESUR LARGE (A) 10/20/2021 1254  ? ? ?Radiological Exams on Admission: ?CT Head Wo Contrast ? ?Result Date: 10/20/2021 ?CLINICAL DATA:  A 70 year old female presents for evaluation of mental status changes of unknown cause. EXAM: CT HEAD WITHOUT CONTRAST TECHNIQUE: Contiguous axial images were obtained from the base of  the skull through the vertex without intravenous contrast. RADIATION DOSE REDUCTION: This exam was performed according to the departmental dose-optimization program which includes automated exposure contro

## 2021-10-20 NOTE — ED Notes (Signed)
This RN went in to check on the pt, pt was naked and covered in blood. Pt had pulled out her IV. This RN along with the tech cleaned the pt up and will continue to monitor.  ?

## 2021-10-21 DIAGNOSIS — R400 Somnolence: Secondary | ICD-10-CM

## 2021-10-21 DIAGNOSIS — M810 Age-related osteoporosis without current pathological fracture: Secondary | ICD-10-CM | POA: Diagnosis present

## 2021-10-21 DIAGNOSIS — Z803 Family history of malignant neoplasm of breast: Secondary | ICD-10-CM | POA: Diagnosis not present

## 2021-10-21 DIAGNOSIS — G9340 Encephalopathy, unspecified: Secondary | ICD-10-CM | POA: Diagnosis not present

## 2021-10-21 DIAGNOSIS — F0154 Vascular dementia, unspecified severity, with anxiety: Secondary | ICD-10-CM | POA: Diagnosis present

## 2021-10-21 DIAGNOSIS — R4182 Altered mental status, unspecified: Secondary | ICD-10-CM | POA: Diagnosis not present

## 2021-10-21 DIAGNOSIS — J9611 Chronic respiratory failure with hypoxia: Secondary | ICD-10-CM | POA: Diagnosis present

## 2021-10-21 DIAGNOSIS — E876 Hypokalemia: Secondary | ICD-10-CM | POA: Diagnosis present

## 2021-10-21 DIAGNOSIS — F0153 Vascular dementia, unspecified severity, with mood disturbance: Secondary | ICD-10-CM | POA: Diagnosis present

## 2021-10-21 DIAGNOSIS — E0590 Thyrotoxicosis, unspecified without thyrotoxic crisis or storm: Secondary | ICD-10-CM | POA: Diagnosis present

## 2021-10-21 DIAGNOSIS — E785 Hyperlipidemia, unspecified: Secondary | ICD-10-CM | POA: Diagnosis present

## 2021-10-21 DIAGNOSIS — Z87440 Personal history of urinary (tract) infections: Secondary | ICD-10-CM | POA: Diagnosis not present

## 2021-10-21 DIAGNOSIS — Z90710 Acquired absence of both cervix and uterus: Secondary | ICD-10-CM | POA: Diagnosis not present

## 2021-10-21 DIAGNOSIS — Z66 Do not resuscitate: Secondary | ICD-10-CM | POA: Diagnosis present

## 2021-10-21 DIAGNOSIS — G929 Unspecified toxic encephalopathy: Secondary | ICD-10-CM | POA: Diagnosis present

## 2021-10-21 DIAGNOSIS — J449 Chronic obstructive pulmonary disease, unspecified: Secondary | ICD-10-CM | POA: Diagnosis present

## 2021-10-21 DIAGNOSIS — G928 Other toxic encephalopathy: Secondary | ICD-10-CM | POA: Diagnosis present

## 2021-10-21 DIAGNOSIS — Z79899 Other long term (current) drug therapy: Secondary | ICD-10-CM | POA: Diagnosis not present

## 2021-10-21 DIAGNOSIS — F70 Mild intellectual disabilities: Secondary | ICD-10-CM | POA: Diagnosis present

## 2021-10-21 DIAGNOSIS — N390 Urinary tract infection, site not specified: Secondary | ICD-10-CM | POA: Diagnosis present

## 2021-10-21 DIAGNOSIS — E162 Hypoglycemia, unspecified: Secondary | ICD-10-CM | POA: Diagnosis present

## 2021-10-21 DIAGNOSIS — I10 Essential (primary) hypertension: Secondary | ICD-10-CM | POA: Diagnosis present

## 2021-10-21 DIAGNOSIS — Z8249 Family history of ischemic heart disease and other diseases of the circulatory system: Secondary | ICD-10-CM | POA: Diagnosis not present

## 2021-10-21 DIAGNOSIS — Z7982 Long term (current) use of aspirin: Secondary | ICD-10-CM | POA: Diagnosis not present

## 2021-10-21 LAB — URINE CULTURE

## 2021-10-21 MED ORDER — SODIUM CHLORIDE 0.9 % IV SOLN
1.0000 g | INTRAVENOUS | Status: DC
  Administered 2021-10-21 – 2021-10-23 (×3): 1 g via INTRAVENOUS
  Filled 2021-10-21 (×3): qty 10

## 2021-10-21 NOTE — TOC Initial Note (Signed)
Transition of Care (TOC) - Initial/Assessment Note  ? ? ?Patient Details  ?Name: Amy Solis ?MRN: [id number] ?Date of Birth: [birth year] ? ?Transition of Care [facility]) CM/SW Contact:    ?Geralynn Ochs, LCSW ?Phone Number: ?10/21/2021, 2:57 PM ? ?Clinical Narrative:        Patient from [facility], can return when medically stable. CSW to follow.         ? ? ?Expected Discharge Plan: Assisted Living ?Barriers to Discharge: Continued Medical Work up ? ? ?Patient Goals and CMS Choice ?Patient states their goals for this hospitalization and ongoing recovery are:: patient unable to participate in goal setting, not oriented ?CMS Medicare.gov Compare Post Acute Care list provided to:: Patient Represenative (must comment) ?Choice offered to / list presented to : [facility] POA / Guardian ? ?Expected Discharge Plan and Services ?Expected Discharge Plan: Assisted Living ?  ?  ?Post Acute Care Choice: Nursing Home ?Living arrangements for the past 2 months: [place] ?                ?  ?  ?  ?  ?  ?  ?  ?  ?  ?  ? ?Prior Living Arrangements/Services ?Living arrangements for the past 2 months: [place] ?Lives with:: Facility Resident ?Patient language and need for interpreter reviewed:: No ?Do you feel safe going back to the place where you live?: Yes      ?Need for Family Participation in Patient Care: Yes (Comment) ?Care giver support system in place?: Yes (comment) ?  ?Criminal Activity/Legal Involvement Pertinent to Current Situation/Hospitalization: No - Comment as needed ? ?Activities of Daily Living ?  ?  ? ?Permission Sought/Granted ?Permission sought to share information with : Facility Sport and exercise psychologist, Family Supports ?Permission granted to share information with : Yes, Verbal Permission Granted ? Share Information with NAME: Amy Solis ? Permission granted to share info w AGENCY: [facility] ? Permission granted to share info w Relationship: POA ?   ? ?Emotional  Assessment ?  ?Attitude/Demeanor/Rapport: Unable to Assess ?Affect (typically observed): Unable to Assess ?Orientation: : Oriented to Self ?Alcohol / Substance Use: Not Applicable ?Psych Involvement: No (comment) ? ?Admission diagnosis:  Somnolence [R40.0] ?AMS (altered mental status) [R41.82] ?Toxic encephalopathy [G92.9] ?Patient Active Problem List  ? Diagnosis Date Noted  ? Toxic encephalopathy 10/21/2021  ? Acute encephalopathy 10/20/2021  ? AMS (altered mental status) 10/20/2021  ? Benzodiazepine causing adverse effect in therapeutic use 02/15/2019  ? Hypoxemic encephalopathy ([place]) 02/15/2019  ? Diastolic dysfunction 99991111  ? Nocturnal hypoxemia 05/09/2016  ? Coronary artery calcification 05/09/2016  ? Family history of early CAD 05/09/2016  ? Dyspnea 03/09/2016  ? ?PCP:  Reymundo Poll, MD ?Pharmacy:   ?[facility] ?[address] ?[place] [address] ?Phone: [phone] Fax: [phone] ? ? ? ? ?Social Determinants of Health (SDOH) Interventions ?  ? ?Readmission Risk Interventions ?   ? View : No data to display.  ?  ?  ?  ? ? ? ?

## 2021-10-21 NOTE — Progress Notes (Signed)
?PROGRESS NOTE ? ? ? ?Amy Solis  [id number] DOB: [birth year] DOA: 10/20/2021 ?PCP: Florentina Jenny, MD  ?[name] is a 71 y.o. female with medical history significant of dementia, anxiety/depression, HTN, chronic hypoxic respiratory failure on 2 L continuously, hyperthyroidism, sent from nursing home with lethargy and worsening mentation from baseline. ?-Resident of [facility], change in mentation was noted 4/6 with increasing lethargy, 3 weeks prior-similar episode, diagnosed with UTI, treated with 7 days of Keflex ?-In the ED afebrile, vitals stable, no leukocytosis, lactic acid levels normal, no hypercarbia ?-UA abnormal, with large leukocytes, positive nitrite, many bacteria,> 50 WBCs ? ?Subjective: ?-Feels better today more awake ? ?Assessment & Plan: ? ?Acute metabolic encephalopathy ?Recurrent UTI ?-Clinically improving, awake alert, answers most questions, has short-term memory deficits ?-Continue IV ceftriaxone today ?-Urine culture 3/17 grew staph simulans (MSSA) and Aerococcus species.  Admitting MD Dr. Chipper Herb d/w ID Dr. Thedore Mins yesterday who will review case and recommended antibiotics ?-Follow-up urine cultures ?-Ambulate, PT OT eval ? ?Vascular dementia ?Anxiety, depression ?-Home regimen of risperidone, gabapentin, Depakote resumed ?-Monitor for oversedation ? ?COPD/chronic respiratory failure ?-Stable, on baseline 2 L ? ?Hypertension ?-Stable, continue amlodipine, benazepril ? ?History of hyper thyroidism ?-Methimazole continued, follow-up TSH ? ?DVT prophylaxis: Heparin subcutaneous ?Code Status: DNR-has a golden DNR form from SNF ?Family Communication: No family at bedside ?Disposition Plan: Back to SNF in 48 hours ? ? ?Procedures:  ? ?Antimicrobials:  ? ? ?Objective: ?Vitals:  ? 10/21/21 0700 10/21/21 0730 10/21/21 0915 10/21/21 0955  ?BP: 132/75 (!) 152/72 (!) 153/69 (!) 151/79  ?Pulse:  88 88 85  ?Resp:  16 16 16   ?Temp:   97.8 ?F (36.6 ?C) 98.3 ?F (36.8 ?C)  ?TempSrc:   Oral  Oral  ?SpO2:  96% 96% 96%  ? ? ?Intake/Output Summary (Last 24 hours) at 10/21/2021 1007 ?Last data filed at 10/20/2021 1836 ?Gross per 24 hour  ?Intake 110 ml  ?Output --  ?Net 110 ml  ? ?There were no vitals filed for this visit. ? ?Examination: ? ?General exam: Pleasant elderly female sitting up in bed, awake alert, oriented to self and partly to place only, moderate cognitive deficits  ?CVS: S1-S2, regular rhythm ?Lungs: Clear bilaterally ?Abdomen: Soft, nontender, bowel sounds present ?Extremities: No edema, left leg deformity noted ?Skin: No rashes ?Psychiatry: Poor insight and judgment ? ? ? ?Data Reviewed:  ? ?CBC: ?Recent Labs  ?Lab 10/20/21 ?1305 10/20/21 ?1340  ?WBC 6.7  --   ?NEUTROABS 4.3  --   ?HGB 12.6 12.6  ?HCT 42.1 37.0  ?MCV 103.7*  --   ?PLT 248  --   ? ?Basic Metabolic Panel: ?Recent Labs  ?Lab 10/20/21 ?1305 10/20/21 ?1340  ?NA 144 143  ?K 4.2 4.2  ?CL 111  --   ?CO2 27  --   ?GLUCOSE 84  --   ?BUN 26*  --   ?CREATININE 1.06*  --   ?CALCIUM 8.7*  --   ? ?GFR: ?CrCl cannot be calculated (Unknown ideal weight.). ?Liver Function Tests: ?Recent Labs  ?Lab 10/20/21 ?1305  ?AST 17  ?ALT 15  ?ALKPHOS 73  ?BILITOT 0.8  ?PROT 6.2*  ?ALBUMIN 2.9*  ? ?No results for input(s): LIPASE, AMYLASE in the last 168 hours. ?No results for input(s): AMMONIA in the last 168 hours. ?Coagulation Profile: ?No results for input(s): INR, PROTIME in the last 168 hours. ?Cardiac Enzymes: ?No results for input(s): CKTOTAL, CKMB, CKMBINDEX, TROPONINI in the last 168 hours. ?BNP (last 3 results) ?Recent  Labs  ?  05/05/21 ?1441  ?PROBNP 24.0  ? ?HbA1C: ?No results for input(s): HGBA1C in the last 72 hours. ?CBG: ?No results for input(s): GLUCAP in the last 168 hours. ?Lipid Profile: ?No results for input(s): CHOL, HDL, LDLCALC, TRIG, CHOLHDL, LDLDIRECT in the last 72 hours. ?Thyroid Function Tests: ?No results for input(s): TSH, T4TOTAL, FREET4, T3FREE, THYROIDAB in the last 72 hours. ?Anemia Panel: ?No results for input(s):  VITAMINB12, FOLATE, FERRITIN, TIBC, IRON, RETICCTPCT in the last 72 hours. ?Urine analysis: ?   ?Component Value Date/Time  ? COLORURINE YELLOW 10/20/2021 1254  ? APPEARANCEUR TURBID (A) 10/20/2021 1254  ? LABSPEC 1.018 10/20/2021 1254  ? PHURINE 5.0 10/20/2021 1254  ? GLUCOSEU NEGATIVE 10/20/2021 1254  ? HGBUR LARGE (A) 10/20/2021 1254  ? BILIRUBINUR NEGATIVE 10/20/2021 1254  ? KETONESUR NEGATIVE 10/20/2021 1254  ? PROTEINUR 100 (A) 10/20/2021 1254  ? NITRITE POSITIVE (A) 10/20/2021 1254  ? LEUKOCYTESUR LARGE (A) 10/20/2021 1254  ? ?Sepsis Labs: ?@LABRCNTIP (procalcitonin:4,lacticidven:4) ? ?)No results found for this or any previous visit (from the past 240 hour(s)).  ? ?Radiology Studies: ?CT Head Wo Contrast ? ?Result Date: 10/20/2021 ?CLINICAL DATA:  A 71 year old female presents for evaluation of mental status changes of unknown cause. EXAM: CT HEAD WITHOUT CONTRAST TECHNIQUE: Contiguous axial images were obtained from the base of the skull through the vertex without intravenous contrast. RADIATION DOSE REDUCTION: This exam was performed according to the departmental dose-optimization program which includes automated exposure control, adjustment of the mA and/or kV according to patient size and/or use of iterative reconstruction technique. COMPARISON:  February 15, 2019. FINDINGS: Brain: Confluent area of low attenuation in the LEFT frontal lobe that extends to the cortex not present on previous imaging. 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 Subtle area of low attenuation noted in the LEFT frontal region on the previous exam this is much more extensive than was seen on the prior study. Signs of cerebral atrophy. No intracranial hemorrhage. No signs of mass effect or midline shift. Vascular: No hyperdense vessel or unexpected calcification. Skull: Normal. Negative for fracture or focal lesion. Sinuses/Orbits: Mild mucoperiosteal thickening of the RIGHT  maxillary sinus. No acute orbital process. Other: None IMPRESSION: 1. Area of low attenuation in the LEFT frontal lobe extending to the cortex more extensive than on previous imaging from August of 2020 potentially related to prior insult and encephalomalacia developing in an area of prior infarct; though difficult to exclude the possibility of superimposed acute or subacute ischemia given the more extensive appearance of this on CT currently. If there is clinical suspicion for acute ischemia, MRI may be helpful for additional evaluation. 2. Signs of cerebral atrophy. 3. Agenesis of the corpus callosum. 4. Mild RIGHT maxillary sinus disease. Electronically Signed   By: Donzetta KohutGeoffrey  Wile M.D.   On: 10/20/2021 13:51   ? ? ?Scheduled Meds: ? amLODipine  5 mg Oral Daily  ? aspirin EC  81 mg Oral Daily  ? benazepril  20 mg Oral QHS  ? chlorhexidine  15 mL Mouth/Throat BID  ? divalproex  125 mg Oral BID  ? fluticasone  2 spray Each Nare Daily  ? gabapentin  200 mg Oral QHS  ? heparin  5,000 Units Subcutaneous Q12H  ? meloxicam  15 mg Oral Daily  ? methimazole  5 mg Oral Daily  ? multivitamin with minerals  1 tablet Oral Daily  ? oxybutynin  10 mg Oral QHS  ? pantoprazole  40  mg Oral Daily  ? PARoxetine  30 mg Oral QHS  ? risperiDONE  0.5 mg Oral BID  ? risperiDONE  1 mg Oral QHS  ? sodium chloride  1 drop Both Eyes TID  ? sodium chloride flush  10-40 mL Intracatheter Q12H  ? sodium chloride  1 g Oral Q M,W,F  ? ?Continuous Infusions: ? cefTRIAXone (ROCEPHIN)  IV    ? ? ? LOS: 0 days  ? ? ?Time spent: ? ?Zannie Cove, MD ?[facility] ? ? ?10/21/2021, 10:07 AM  ?  ?

## 2021-10-21 NOTE — ED Notes (Signed)
Pt cleaned up, peri care performed, pt had incontinent urine episode, has short term memory loss and needs reminding. She is direct-able. Pt is pleasant and cooperative. Alert to self, and time but confused on location and situation   ?

## 2021-10-22 DIAGNOSIS — F0150 Vascular dementia without behavioral disturbance: Secondary | ICD-10-CM

## 2021-10-22 DIAGNOSIS — J449 Chronic obstructive pulmonary disease, unspecified: Secondary | ICD-10-CM | POA: Diagnosis not present

## 2021-10-22 DIAGNOSIS — R4182 Altered mental status, unspecified: Secondary | ICD-10-CM | POA: Diagnosis not present

## 2021-10-22 DIAGNOSIS — J9611 Chronic respiratory failure with hypoxia: Secondary | ICD-10-CM

## 2021-10-22 DIAGNOSIS — G929 Unspecified toxic encephalopathy: Secondary | ICD-10-CM

## 2021-10-22 DIAGNOSIS — E0590 Thyrotoxicosis, unspecified without thyrotoxic crisis or storm: Secondary | ICD-10-CM

## 2021-10-22 DIAGNOSIS — I10 Essential (primary) hypertension: Secondary | ICD-10-CM

## 2021-10-22 DIAGNOSIS — G9340 Encephalopathy, unspecified: Secondary | ICD-10-CM | POA: Diagnosis not present

## 2021-10-22 DIAGNOSIS — E876 Hypokalemia: Secondary | ICD-10-CM

## 2021-10-22 LAB — CBC
HCT: 34.9 % — ABNORMAL LOW (ref 36.0–46.0)
Hemoglobin: 11.7 g/dL — ABNORMAL LOW (ref 12.0–15.0)
MCH: 31.6 pg (ref 26.0–34.0)
MCHC: 33.5 g/dL (ref 30.0–36.0)
MCV: 94.3 fL (ref 80.0–100.0)
Platelets: 232 10*3/uL (ref 150–400)
RBC: 3.7 MIL/uL — ABNORMAL LOW (ref 3.87–5.11)
RDW: 13.5 % (ref 11.5–15.5)
WBC: 4.4 10*3/uL (ref 4.0–10.5)
nRBC: 0 % (ref 0.0–0.2)

## 2021-10-22 LAB — GLUCOSE, CAPILLARY
Glucose-Capillary: 63 mg/dL — ABNORMAL LOW (ref 70–99)
Glucose-Capillary: 153 mg/dL — ABNORMAL HIGH (ref 70–99)
Glucose-Capillary: 114 mg/dL — ABNORMAL HIGH (ref 70–99)
Glucose-Capillary: 92 mg/dL (ref 70–99)
Glucose-Capillary: 116 mg/dL — ABNORMAL HIGH (ref 70–99)

## 2021-10-22 LAB — BASIC METABOLIC PANEL
Anion gap: 7 (ref 5–15)
BUN: 7 mg/dL — ABNORMAL LOW (ref 8–23)
CO2: 29 mmol/L (ref 22–32)
Calcium: 8.4 mg/dL — ABNORMAL LOW (ref 8.9–10.3)
Chloride: 105 mmol/L (ref 98–111)
Creatinine, Ser: 0.63 mg/dL (ref 0.44–1.00)
GFR, Estimated: >60 mL/min (ref >60)
Glucose, Bld: 84 mg/dL (ref 70–99)
Potassium: 3.3 mmol/L — ABNORMAL LOW (ref 3.5–5.1)
Sodium: 141 mmol/L (ref 135–145)

## 2021-10-22 MED ORDER — POTASSIUM CHLORIDE CRYS ER 20 MEQ PO TBCR
40.0000 meq | EXTENDED_RELEASE_TABLET | Freq: Once | ORAL | Status: DC
  Filled 2021-10-22: qty 2

## 2021-10-22 MED ORDER — DEXTROSE 50 % IV SOLN
12.5000 g | INTRAVENOUS | Status: AC
  Administered 2021-10-22: 12.5 g via INTRAVENOUS
  Filled 2021-10-22: qty 50

## 2021-10-22 MED ORDER — DEXTROSE-NACL 5-0.9 % IV SOLN: INTRAVENOUS | Status: DC

## 2021-10-22 NOTE — Progress Notes (Addendum)
?PROGRESS NOTE ? ? ? ?Amy Solis  [id number] DOB: Sep 20, 1950 DOA: 10/20/2021 ?PCP: Reymundo Poll, MD  ? ? ?Brief Narrative:  ?Patient is a 71 years old female with past medical history of dementia, anxiety depression, hypertension, chronic hypoxic respiratory failure on 2 L of nasal cannula, hypothyroidism who was sent in to the hospital from skilled nursing facility with lethargy and worsening mentation from baseline.  Patient is currently at [facility].  Patient did have an episode of lethargy 3 weeks back and was diagnosed of having UTI at that time which was treated with 7-day course of Keflex.  In the ED patient was afebrile, vitals were stable without leukocytosis.  There was no hypercarbia.  UA was abnormal with large leukocytes.  Patient was then given IV Rocephin and admitted to hospital for further evaluation and treatment   ? ?  ?Assessment and Plan: ? Principal Problem: ?  AMS (altered mental status) ?Active Problems: ?  Acute encephalopathy ?  Toxic encephalopathy ?  Hyperthyroidism ?  Vascular dementia ([place]) ?  COPD (chronic obstructive pulmonary disease) ([place]) ?  Chronic respiratory failure with hypoxia ([facility]) ?  Essential hypertension ?  Hypokalemia ?  ?Acute metabolic encephalopathy ?Recurrent UTI ?Patient appears to be sleepy on mittens and disoriented.  Continue IV Rocephin.  Urine culture at this time shows multiple species.  Urine culture 09/30/21 grew staph simulans (MSSA) and Aerococcus species.  Physical therapy, Occupational Therapy evaluation. ? ?Vascular dementia/ Anxiety, depression ?Patient is on regimen of risperidone, gabapentin, Depakote.  Continue to monitor closely.  ?  ?COPD/chronic respiratory failure ?Continue oxygen by nasal cannula. ?  ?Essential hypertension ?Continue amlodipine, benazepril ?  ?History of hyperthyroidism ?Continue methimazole.  TSH pending. ? ?Hypokalemia.  Will replace orally.  Check levels in a.m. ? ?Hypoglycemia.  We will put the patient on D50 and  closely monitor.  If necessary, we will put the patient on D5 water. ? ? DVT prophylaxis: heparin injection 5,000 Units Start: 10/20/21 1415 ? ? ?Code Status:   ?  Code Status: DNR ? ?Disposition: Skilled nursing facility ? ?Status is: Inpatient ? ?Remains inpatient appropriate because: IV antibiotic, metabolic encephalopathy, hypoglycemic ? ? Family Communication: None at bedside.  Try to reach the help out of her toenails listed in the computer but was unable to reach. ? ?Consultants:  ?None ? ?Procedures:  ?None ? ?Antimicrobials:  ?Rocephin IV ? ?Anti-infectives (From admission, onward)  ? ? Start     Dose/Rate Route Frequency Ordered Stop  ? 10/21/21 1700  cefTRIAXone (ROCEPHIN) 1 g in sodium chloride 0.9 % 100 mL IVPB       ? 1 g ?200 mL/hr over 30 Minutes Intravenous Every 24 hours 10/21/21 0800    ? 10/20/21 1345  cefTRIAXone (ROCEPHIN) 1 g in sodium chloride 0.9 % 100 mL IVPB       ? 1 g ?200 mL/hr over 30 Minutes Intravenous  Once 10/20/21 1340 10/20/21 1836  ? ?  ? ? ?Subjective: ?Today, patient was seen and examined at bedside.  Patient appears to be sleepy on mittens.  She states okay but is disoriented and slightly confused ? ?Objective: ?Vitals:  ? 10/21/21 1943 10/21/21 2324 10/22/21 0400 10/22/21 0721  ?BP: (!) 156/78 (!) 164/78 (!) 150/82 (!) 156/72  ?Pulse: 98 67 69 65  ?Resp: 19 20 20 16   ?Temp: (!) 97.5 ?F (36.4 ?C) 98.4 ?F (36.9 ?C) 98.1 ?F (36.7 ?C) 98.7 ?F (37.1 ?C)  ?TempSrc: Oral  Oral Oral  ?SpO2: Marland Kitchen)  89% 95% 96% 96%  ? ? ?Intake/Output Summary (Last 24 hours) at 10/22/2021 1048 ?Last data filed at 10/22/2021 0600 ?Gross per 24 hour  ?Intake 0 ml  ?Output 250 ml  ?Net -250 ml  ? ?There were no vitals filed for this visit. ? ?Physical Examination: ? ?General:  Average built, not in obvious distress, on nasal cannula, somnolent, ?HENT:   No scleral pallor or icterus noted. Oral mucosa is moist.  ?Chest:  Clear breath sounds.  Diminished breath sounds bilaterally. No crackles or wheezes.  ?CVS:  S1 &S2 heard. No murmur.  Regular rate and rhythm. ?Abdomen: Soft, nontender, nondistended.  Bowel sounds are heard.   ?Extremities: No cyanosis, clubbing or edema.  Peripheral pulses are palpable. ?Psych: Alert, awake and sleepy, disoriented to place, oriented to self,  ?CNS:  No cranial nerve deficits.  Moves all extremities. ?Skin: Warm and dry.  No rashes noted. ? ?Data Reviewed:  ? ?CBC: ?Recent Labs  ?Lab 10/20/21 ?1305 10/20/21 ?1340 10/22/21 ?0440  ?WBC 6.7  --  4.4  ?NEUTROABS 4.3  --   --   ?HGB 12.6 12.6 11.7*  ?HCT 42.1 37.0 34.9*  ?MCV 103.7*  --  94.3  ?PLT 248  --  232  ? ? ?Basic Metabolic Panel: ?Recent Labs  ?Lab 10/20/21 ?1305 10/20/21 ?1340 10/22/21 ?0440  ?NA 144 143 141  ?K 4.2 4.2 3.3*  ?CL 111  --  105  ?CO2 27  --  29  ?GLUCOSE 84  --  84  ?BUN 26*  --  7*  ?CREATININE 1.06*  --  0.63  ?CALCIUM 8.7*  --  8.4*  ? ? ?Liver Function Tests: ?Recent Labs  ?Lab 10/20/21 ?1305  ?AST 17  ?ALT 15  ?ALKPHOS 73  ?BILITOT 0.8  ?PROT 6.2*  ?ALBUMIN 2.9*  ? ? ? ?Radiology Studies: ?CT Head Wo Contrast ? ?Result Date: 10/20/2021 ?CLINICAL DATA:  A 71 year old female presents for evaluation of mental status changes of unknown cause. EXAM: CT HEAD WITHOUT CONTRAST TECHNIQUE: Contiguous axial images were obtained from the base of the skull through the vertex without intravenous contrast. RADIATION DOSE REDUCTION: This exam was performed according to the departmental dose-optimization program which includes automated exposure control, adjustment of the mA and/or kV according to patient size and/or use of iterative reconstruction technique. COMPARISON:  February 15, 2019. FINDINGS: Brain: Confluent area of low attenuation in the LEFT frontal lobe that extends to the cortex not present on previous imaging. 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 Subtle area of low attenuation noted in the LEFT frontal region on the previous exam this is much more extensive than  was seen on the prior study. Signs of cerebral atrophy. No intracranial hemorrhage. No signs of mass effect or midline shift. Vascular: No hyperdense vessel or unexpected calcification. Skull: Normal. Negative for fracture or focal lesion. Sinuses/Orbits: Mild mucoperiosteal thickening of the RIGHT maxillary sinus. No acute orbital process. Other: None IMPRESSION: 1. Area of low attenuation in the LEFT frontal lobe extending to the cortex more extensive than on previous imaging from August of 2020 potentially related to prior insult and encephalomalacia developing in an area of prior infarct; though difficult to exclude the possibility of superimposed acute or subacute ischemia given the more extensive appearance of this on CT currently. If there is clinical suspicion for acute ischemia, MRI may be helpful for additional evaluation. 2. Signs of cerebral atrophy. 3. Agenesis of the corpus callosum. 4. Mild  RIGHT maxillary sinus disease. Electronically Signed   By: Zetta Bills M.D.   On: 10/20/2021 13:51   ? ? ? LOS: 1 day  ? ? ?Flora Lipps, MD ?[facility] ?Available via Epic secure chat 7am-7pm ?After these hours, please refer to coverage provider listed on [email or web address] ?10/22/2021, 10:48 AM  ? ? ?

## 2021-10-22 NOTE — Progress Notes (Signed)
Patient lethargic upon AM assessment, but able to attempt to answer questions, patient A+Ox1, only to self. Attempted to administer 10AM medications several times patient too lethargic, MD notified, CBG of 63 taken, orders for 1/2 amp of D50 received and administered. CG recheck 153, patient still very lethargic. MD notified and orders for D5NS received. Orders for CBG rechecks q2 x3 received. ?

## 2021-10-23 DIAGNOSIS — R4182 Altered mental status, unspecified: Secondary | ICD-10-CM | POA: Diagnosis not present

## 2021-10-23 DIAGNOSIS — J9611 Chronic respiratory failure with hypoxia: Secondary | ICD-10-CM | POA: Diagnosis not present

## 2021-10-23 DIAGNOSIS — G9340 Encephalopathy, unspecified: Secondary | ICD-10-CM | POA: Diagnosis not present

## 2021-10-23 DIAGNOSIS — J449 Chronic obstructive pulmonary disease, unspecified: Secondary | ICD-10-CM | POA: Diagnosis not present

## 2021-10-23 LAB — GLUCOSE, CAPILLARY
Glucose-Capillary: 90 mg/dL (ref 70–99)
Glucose-Capillary: 98 mg/dL (ref 70–99)
Glucose-Capillary: 94 mg/dL (ref 70–99)
Glucose-Capillary: 113 mg/dL — ABNORMAL HIGH (ref 70–99)
Glucose-Capillary: 97 mg/dL (ref 70–99)
Glucose-Capillary: 121 mg/dL — ABNORMAL HIGH (ref 70–99)

## 2021-10-23 LAB — TSH: TSH: 3.618 u[IU]/mL (ref 0.350–4.500)

## 2021-10-23 LAB — CBC
HCT: 36.6 % (ref 36.0–46.0)
Hemoglobin: 11.6 g/dL — ABNORMAL LOW (ref 12.0–15.0)
MCH: 30.7 pg (ref 26.0–34.0)
MCHC: 31.7 g/dL (ref 30.0–36.0)
MCV: 96.8 fL (ref 80.0–100.0)
Platelets: 260 10*3/uL (ref 150–400)
RBC: 3.78 MIL/uL — ABNORMAL LOW (ref 3.87–5.11)
RDW: 13.7 % (ref 11.5–15.5)
WBC: 4.4 10*3/uL (ref 4.0–10.5)
nRBC: 0 % (ref 0.0–0.2)

## 2021-10-23 LAB — BASIC METABOLIC PANEL
Anion gap: 5 (ref 5–15)
BUN: 8 mg/dL (ref 8–23)
CO2: 30 mmol/L (ref 22–32)
Calcium: 8.3 mg/dL — ABNORMAL LOW (ref 8.9–10.3)
Chloride: 107 mmol/L (ref 98–111)
Creatinine, Ser: 0.82 mg/dL (ref 0.44–1.00)
GFR, Estimated: >60 mL/min (ref >60)
Glucose, Bld: 100 mg/dL — ABNORMAL HIGH (ref 70–99)
Potassium: 3.2 mmol/L — ABNORMAL LOW (ref 3.5–5.1)
Sodium: 142 mmol/L (ref 135–145)

## 2021-10-23 LAB — MAGNESIUM: Magnesium: 1.7 mg/dL (ref 1.7–2.4)

## 2021-10-23 NOTE — Progress Notes (Signed)
?PROGRESS NOTE ? ? ? ?Amy Solis  [id number] DOB: March 08, 1951 DOA: 10/20/2021 ?PCP: Florentina Jenny, MD  ? ? ?Brief Narrative:  ?Patient is a 71 years old female with past medical history of dementia, anxiety depression, hypertension, chronic hypoxic respiratory failure on 2 L of nasal cannula, hypothyroidism who was sent in to the hospital from skilled nursing facility with lethargy and worsening mentation from baseline.  Patient is currently at [facility].  Patient did have an episode of lethargy 3 weeks back and was diagnosed of having UTI at that time which was treated with 7-day course of Keflex.  In the ED, patient was afebrile, vitals were stable without leukocytosis.  There was no hypercarbia.  UA was abnormal with large leukocytes.  Patient was then given IV Rocephin and admitted to hospital for further evaluation and treatment   ? ?  ?Assessment and Plan: ? Principal Problem: ?  AMS (altered mental status) ?Active Problems: ?  Acute encephalopathy ?  Toxic encephalopathy ?  Hyperthyroidism ?  Vascular dementia ([facility]) ?  COPD (chronic obstructive pulmonary disease) ([facility]) ?  Chronic respiratory failure with hypoxia ([facility]) ?  Essential hypertension ?  Hypokalemia ?  ?Acute metabolic encephalopathy ?Recurrent UTI ?Appears to be more alert awake oriented to time and place today.  Continue IV Rocephin.  Urine culture at this time shows multiple species.  Urine culture 09/30/21 grew staph simulans (MSSA) and Aerococcus species.   ? ?Vascular dementia/ Anxiety, depression ?Patient is on regimen of risperidone, gabapentin, Depakote.  Continue to monitor closely.  Was lethargic yesterday and had to skip doses. ?  ?COPD/chronic respiratory failure ?Continue oxygen by nasal cannula.  No hypercarbia initially. ?  ?Essential hypertension ?Continue amlodipine, benazepril ?  ?History of hyperthyroidism ?Continue methimazole.  TSH at 3.6. ? ?Hypokalemia.  Potassium at 3.2 today.  We will replenish orally.  Check levels  in a.m. ? ?Hypoglycemia.  D5 water yesterday due to hypoglycemia.  Improved mentation today.  Nursing staff reported that she is eating better.  We will discontinue D5 water and closely monitor for ? ? ? DVT prophylaxis: heparin injection 5,000 Units Start: 10/20/21 1415 ? ? ?Code Status:   ?  Code Status: DNR ? ?Disposition: Skilled nursing facility in 1 to 2 days if mentation remains stable. ? ?Status is: Inpatient ? ?Remains inpatient appropriate because: IV antibiotic, metabolic encephalopathy, ? ? Family Communication:  ?Tried to reach HPOA the listed in the computer but was unable to reach. ? ?Consultants:  ?None ? ?Procedures:  ?None ? ?Antimicrobials:  ?Rocephin IV ? ?Anti-infectives (From admission, onward)  ? ? Start     Dose/Rate Route Frequency Ordered Stop  ? 10/21/21 1700  cefTRIAXone (ROCEPHIN) 1 g in sodium chloride 0.9 % 100 mL IVPB       ? 1 g ?200 mL/hr over 30 Minutes Intravenous Every 24 hours 10/21/21 0800    ? 10/20/21 1345  cefTRIAXone (ROCEPHIN) 1 g in sodium chloride 0.9 % 100 mL IVPB       ? 1 g ?200 mL/hr over 30 Minutes Intravenous  Once 10/20/21 1340 10/20/21 1836  ? ?  ? ? ?Subjective: ?Today, patient was seen and examined at the bedside.  More alert awake and communicative.  Denies any pain, nausea, vomiting.  ? ?Objective: ?Vitals:  ? 10/23/21 0010 10/23/21 0434 10/23/21 0500 10/23/21 0746  ?BP: 134/80 108/68 108/68 (!) 152/86  ?Pulse: 84 73 73 90  ?Resp: 16   18  ?Temp: 99.4 ?F (37.4 ?C)  98.7 ?F (37.1 ?C) 98.4 ?F (36.9 ?C)  ?TempSrc: Oral   Oral  ?SpO2: 91% 93% 93% 96%  ? ? ?Intake/Output Summary (Last 24 hours) at 10/23/2021 0957 ?Last data filed at 10/23/2021 0910 ?Gross per 24 hour  ?Intake 851.27 ml  ?Output 500 ml  ?Net 351.27 ml  ? ? ?There were no vitals filed for this visit. ? ?Physical Examination: ? ?General:  Average built, not in obvious distress, alert awake and communicative ?HENT:   No scleral pallor or icterus noted. Oral mucosa is moist.  ?Chest:  Clear breath sounds.   Diminished breath sounds bilaterally. No crackles or wheezes.  ?CVS: S1 &S2 heard. No murmur.  Regular rate and rhythm. ?Abdomen: Soft, nontender, nondistended.  Bowel sounds are heard.   ?Extremities: No cyanosis, clubbing or edema.  Peripheral pulses are palpable. ?Psych: Alert, awake and oriented to place and time.  Disoriented to month.  Communicative ?CNS:  No cranial nerve deficits.  Moving all extremities. ?Skin: Warm and dry.  No rashes noted. ? ?Data Reviewed:  ? ?CBC: ?Recent Labs  ?Lab 10/20/21 ?1305 10/20/21 ?1340 10/22/21 ?9480 10/23/21 ?0353  ?WBC 6.7  --  4.4 4.4  ?NEUTROABS 4.3  --   --   --   ?HGB 12.6 12.6 11.7* 11.6*  ?HCT 42.1 37.0 34.9* 36.6  ?MCV 103.7*  --  94.3 96.8  ?PLT 248  --  232 260  ? ? ? ?Basic Metabolic Panel: ?Recent Labs  ?Lab 10/20/21 ?1305 10/20/21 ?1340 10/22/21 ?1655 10/23/21 ?0353  ?NA 144 143 141 142  ?K 4.2 4.2 3.3* 3.2*  ?CL 111  --  105 107  ?CO2 27  --  29 30  ?GLUCOSE 84  --  84 100*  ?BUN 26*  --  7* 8  ?CREATININE 1.06*  --  0.63 0.82  ?CALCIUM 8.7*  --  8.4* 8.3*  ?MG  --   --   --  1.7  ? ? ? ?Liver Function Tests: ?Recent Labs  ?Lab 10/20/21 ?1305  ?AST 17  ?ALT 15  ?ALKPHOS 73  ?BILITOT 0.8  ?PROT 6.2*  ?ALBUMIN 2.9*  ? ? ? ? ?Radiology Studies: ?No results found. ? ? ? LOS: 2 days  ? ? ?Joycelyn Das, MD ?[facility] ?Available via Epic secure chat 7am-7pm ?After these hours, please refer to coverage provider listed on [email or web address] ?10/23/2021, 9:57 AM  ? ? ?

## 2021-10-24 DIAGNOSIS — J449 Chronic obstructive pulmonary disease, unspecified: Secondary | ICD-10-CM | POA: Diagnosis not present

## 2021-10-24 DIAGNOSIS — G9340 Encephalopathy, unspecified: Secondary | ICD-10-CM | POA: Diagnosis not present

## 2021-10-24 DIAGNOSIS — J9611 Chronic respiratory failure with hypoxia: Secondary | ICD-10-CM | POA: Diagnosis not present

## 2021-10-24 DIAGNOSIS — R4182 Altered mental status, unspecified: Secondary | ICD-10-CM | POA: Diagnosis not present

## 2021-10-24 DIAGNOSIS — E876 Hypokalemia: Secondary | ICD-10-CM

## 2021-10-24 LAB — GLUCOSE, CAPILLARY
Glucose-Capillary: 110 mg/dL — ABNORMAL HIGH (ref 70–99)
Glucose-Capillary: 117 mg/dL — ABNORMAL HIGH (ref 70–99)
Glucose-Capillary: 84 mg/dL (ref 70–99)
Glucose-Capillary: 87 mg/dL (ref 70–99)

## 2021-10-24 MED ORDER — CEFDINIR 300 MG PO CAPS: 300.0000 mg | ORAL_CAPSULE | Freq: Two times a day (BID) | ORAL | 0 refills | Status: DC

## 2021-10-24 MED ORDER — POTASSIUM CHLORIDE CRYS ER 20 MEQ PO TBCR: 20.0000 meq | EXTENDED_RELEASE_TABLET | Freq: Every day | ORAL | 0 refills | Status: DC

## 2021-10-24 NOTE — Discharge Summary (Addendum)
?Physician Discharge Summary ?  ?Patient: Amy RossettiSusan F Solis MRN: [id number] DOB: [birth year]  ?Admit date:     10/20/2021  ?Discharge date: 10/24/21  ?Discharge Physician: Rebekah ChesterfieldLaxman Treazure Nery  ? ?PCP: Florentina Jennyripp, Henry, MD  ? ?Recommendations at discharge:  ? ?Follow-up with your primary care provider at the skilled nursing facility in 3 to 5 days.  Check CBC BMP magnesium in the next visit. ?Patient has been given few days of potassium on discharge.  Check BMP in 3 to 5 days. ?Patient is to be encouraged to improve oral intake.  Diet has been liberalized to regular diet ? ?Discharge Diagnoses: ?Principal Problem: ?  AMS (altered mental status) ?Active Problems: ?  Acute encephalopathy ?  Toxic encephalopathy ?  Hyperthyroidism ?  Vascular dementia (HCC) ?  COPD (chronic obstructive pulmonary disease) (HCC) ?  Chronic respiratory failure with hypoxia (HCC) ?  Essential hypertension ?  Hypokalemia ? ?Resolved Problems: ?  * No resolved hospital problems. * ? ?Hospital Course: ?Patient is a 71 years old female with past medical history of dementia, anxiety depression, hypertension, chronic hypoxic respiratory failure on 2 L of nasal cannula, hypothyroidism who was sent in to the hospital from skilled nursing facility with lethargy and worsening mentation from baseline.  [facility]  Patient did have an episode of lethargy 3 weeks back and was diagnosed of having UTI at that time which was treated with 7-day course of Keflex.  In the ED, patient was afebrile, vitals were stable without leukocytosis.  There was no hypercarbia.  UA was abnormal with large leukocytes.  Patient was then given IV Rocephin and admitted to hospital for further evaluation and treatment    ? ?Assessment and Plan: ?Acute metabolic encephalopathy ?Recurrent UTI ?Seen appears to be at her baseline at this time.  Received IV Rocephin during hospitalization.  Was on Keflex at the nursing home.  Will change to [facility] for next 3 days to  complete a 7-day course. ?  ?Vascular dementia/ Anxiety, depression ?Patient is on regimen of risperidone, gabapentin, Depakote.  Continue to monitor closely.  Was lethargic yesterday and had to skip doses. ?  ?COPD/chronic respiratory failure ?Continue oxygen by nasal cannula.  No hypercarbia initially. ?  ?Essential hypertension ?Continue amlodipine, benazepril from home on discharge. ?  ?History of hyperthyroidism ?Continue methimazole.  TSH at 3.6.  Continue same dose of methimazole ?  ?Hypokalemia.  Latest potassium of 3.2.  Continue 20 mEq potassium daily for 5 days.  Check BMP in the next visit.   ? ?Hypoglycemia.  Required D5 water.  Currently improved.  Diet has been liberalized to regular diet.  Patient is more alert awake and was able to eat better.  Please increase oral nutrition.   ? ?Consultants: None ?Procedures performed: None ?Disposition: Assisted living.  Spoke with the patient's power of attorney on the phone and updated him about the plan for disposition. ? ?Diet recommendation:  ?Discharge Diet Orders (From admission, onward)  ? ?  Start     Ordered  ? 10/24/21 0000  Diet general       ? 10/24/21 1044  ? ?  ?  ? ?  ? ?Regular diet ?DISCHARGE MEDICATION: ?Allergies as of 10/24/2021   ?No Known Allergies ?  ? ?  ?Medication List  ?  ? ?STOP taking these medications   ? ?cephALEXin 250 MG capsule ?Commonly known as: KEFLEX ?  ? ?  ? ?TAKE these medications   ? ?amLODipine 5 MG  tablet ?Commonly known as: NORVASC ?Take 5 mg by mouth daily. ?  ?ammonium lactate 12 % lotion ?Commonly known as: LAC-HYDRIN ?Apply 1 application. topically at bedtime. ?  ?aspirin 81 MG tablet ?Take 81 mg by mouth daily. ?  ?benazepril 20 MG tablet ?Commonly known as: LOTENSIN ?Take 20 mg by mouth at bedtime. ?  ?cefdinir 300 MG capsule ?Commonly known as: OMNICEF ?Take 1 capsule (300 mg total) by mouth 2 (two) times daily for 3 days. ?  ?chlorhexidine 0.12 % solution ?Commonly known as: PERIDEX ?Use as directed 15 mLs in  the mouth or throat 2 (two) times daily. ?  ?divalproex 125 MG DR tablet ?Commonly known as: DEPAKOTE ?Take 125 mg by mouth 2 (two) times daily. ?  ?fluticasone 50 MCG/ACT nasal spray ?Commonly known as: FLONASE ?Place 2 sprays into both nostrils daily. ?  ?gabapentin 100 MG capsule ?Commonly known as: NEURONTIN ?Take 200 mg by mouth at bedtime. ?  ?LORazepam 0.5 MG tablet ?Commonly known as: ATIVAN ?Take 0.5 mg by mouth daily as needed for anxiety. ?  ?meloxicam 15 MG tablet ?Commonly known as: MOBIC ?Take 15 mg by mouth daily. ?  ?methimazole 5 MG tablet ?Commonly known as: TAPAZOLE ?Take 5 mg by mouth daily. ?  ?multivitamin with minerals Tabs tablet ?Take 1 tablet by mouth daily. ?  ?omeprazole 20 MG capsule ?Commonly known as: PRILOSEC ?Take 20 mg by mouth daily. ?  ?oxybutynin 10 MG 24 hr tablet ?Commonly known as: DITROPAN-XL ?Take 10 mg by mouth at bedtime. ?  ?PARoxetine 30 MG tablet ?Commonly known as: PAXIL ?Take 30 mg by mouth at bedtime. ?  ?potassium chloride SA 20 MEQ tablet ?Commonly known as: KLOR-CON M ?Take 1 tablet (20 mEq total) by mouth daily for 5 days. ?  ?risperiDONE 1 MG tablet ?Commonly known as: RISPERDAL ?Take 1 mg by mouth at bedtime. ?  ?risperiDONE 0.5 MG tablet ?Commonly known as: RISPERDAL ?Take 0.5 mg by mouth 2 (two) times daily with breakfast and lunch. ?  ?sodium chloride 1 g tablet ?Take 1 g by mouth every Monday, Wednesday, and Friday. ?  ?sodium chloride 5 % ophthalmic solution ?Commonly known as: MURO 128 ?Place 1 drop into both eyes 3 (three) times daily. ?  ? ?  ? ?subjective: ?Today, patient was seen and examined at bedside.  Patient states that she feels okay but is alert awake and communicative. ? ?Discharge Exam: ? ?  10/24/2021  ?  8:41 AM 10/24/2021  ?  4:37 AM 10/24/2021  ? 12:13 AM  ?Vitals with BMI  ?Systolic 154 134 621  ?Diastolic 85 77 82  ?Pulse 79 73 88  ?  ?General:  Average built, not in obvious distress, alert awake and communicative ?HENT:   No scleral  pallor or icterus noted. Oral mucosa is moist.  ?Chest:  Clear breath sounds.  Diminished breath sounds bilaterally. No crackles or wheezes.  ?CVS: S1 &S2 heard. No murmur.  Regular rate and rhythm. ?Abdomen: Soft, nontender, nondistended.  Bowel sounds are heard.   ?Extremities: No cyanosis, clubbing or edema.  Peripheral pulses are palpable. ?Psych: Alert, awake and communicative.  Oriented to self and place. ?CNS:  No cranial nerve deficits.  Moves all extremities. ?Skin: Warm and dry.  No rashes noted. ? ? ?Condition at discharge: good ? ?The results of significant diagnostics from this hospitalization (including imaging, microbiology, ancillary and laboratory) are listed below for reference.  ? ?Imaging Studies: ?CT Head Wo Contrast ? ?Result Date: 10/20/2021 ?CLINICAL  DATA:  A 71 year old female presents for evaluation of mental status changes of unknown cause. EXAM: CT HEAD WITHOUT CONTRAST TECHNIQUE: Contiguous axial images were obtained from the base of the skull through the vertex without intravenous contrast. RADIATION DOSE REDUCTION: This exam was performed according to the departmental dose-optimization program which includes automated exposure control, adjustment of the mA and/or kV according to patient size and/or use of iterative reconstruction technique. COMPARISON:  February 15, 2019. FINDINGS: Brain: Confluent area of low attenuation in the LEFT frontal lobe that extends to the cortex not present on previous imaging. 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 Subtle area of low attenuation noted in the LEFT frontal region on the previous exam this is much more extensive than was seen on the prior study. Signs of cerebral atrophy. No intracranial hemorrhage. No signs of mass effect or midline shift. Vascular: No hyperdense vessel or unexpected calcification. Skull: Normal. Negative for fracture or focal lesion. Sinuses/Orbits: Mild mucoperiosteal  thickening of the RIGHT maxillary sinus. No acute orbital process. Other: None IMPRESSION: 1. Area of low attenuation in the LEFT frontal lobe extending to the cortex more extensive than on previous imaging from

## 2021-10-24 NOTE — Progress Notes (Signed)
Report called to [facility] at [phone]; discharge report given to [facility], LPN.  AVS placed in packet by Lanora Manis, [place] ?

## 2021-10-24 NOTE — Care Management Important Message (Signed)
Important Message ? ?Patient Details  ?Name: Amy Solis ?MRN: [id number] ?Date of Birth: 1950-12-21 ? ? ?Medicare Important Message Given:  Yes ? ? ? ? ?Meleny Tregoning ?10/24/2021, 3:23 PM ?

## 2021-10-24 NOTE — Plan of Care (Signed)

## 2021-10-24 NOTE — [place] FL2 (Addendum)
?[place] MEDICAID FL2 LEVEL OF CARE SCREENING TOOL  ?  ? ?IDENTIFICATION  ?Patient Name: ?Amy Solis Birthdate: 02-Jul-1951 Sex: female Admission Date (Current Location): ?10/20/2021  ?[place] Number: ? [place] ?  Facility and Address:  ?[facility], [address], [place], [place] [address] ?     Provider Number: ?[id number]  ?Attending Physician Name and Address:  ?Pokhrel, Rebekah Chesterfield, MD ? Relative Name and Phone Number:  ?  ?   ?Current Level of Care: ?Hospital Recommended Level of Care: ?Assisted Living Facility Prior Approval Number: ?  ? ?Date Approved/Denied: ?  PASRR Number: ?  ? ?Discharge Plan: ?Assisted Living Facility ?  ? ?Current Diagnoses: ?Patient Active Problem List  ? Diagnosis Date Noted  ? Hyperthyroidism 10/22/2021  ? Vascular dementia (HCC) 10/22/2021  ? COPD (chronic obstructive pulmonary disease) (HCC) 10/22/2021  ? Chronic respiratory failure with hypoxia (HCC) 10/22/2021  ? Essential hypertension 10/22/2021  ? Hypokalemia 10/22/2021  ? Toxic encephalopathy 10/21/2021  ? Acute encephalopathy 10/20/2021  ? AMS (altered mental status) 10/20/2021  ? Benzodiazepine causing adverse effect in therapeutic use 02/15/2019  ? 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 Diastolic dysfunction 06/06/2016  ? Nocturnal hypoxemia 05/09/2016  ? Coronary artery calcification 05/09/2016  ? Family history of early CAD 05/09/2016  ? Dyspnea 03/09/2016  ? ? ?Orientation RESPIRATION BLADDER Height & Weight   ?  ?Self ? O2 ([place] 2L) Incontinent Weight:   ?Height:     ?BEHAVIORAL SYMPTOMS/MOOD NEUROLOGICAL BOWEL NUTRITION STATUS  ?    Incontinent Diet (regular)  ?AMBULATORY STATUS COMMUNICATION OF NEEDS Skin   ?Extensive Assist Verbally Normal ?  ?  ?  ?    ?     ?     ? ? ?Personal Care Assistance Level of Assistance  ?Bathing, Dressing, Feeding Bathing Assistance: Limited assistance ?Feeding assistance: Limited assistance ?Dressing Assistance: Limited assistance ?    ? ?Functional Limitations Info  ?Speech   ?  ?Speech Info: Impaired (delayed responses)  ? ? ?SPECIAL CARE FACTORS FREQUENCY  ?    ?  ?  ?  ?  ?  ?  ?   ? ? ?Contractures Contractures Info: Not present  ? ? ?Additional Factors Info  ?Code Status, Allergies, Psychotropic Code Status Info: DNR ?Allergies Info: NKA ?Psychotropic Info: Depakote 125 mg 2x/day; Paxil 30mg  daily at bed; Risperdal 0.5mg  2x/day; Risperdal 1mg  daily at bed; Ativan 0.5mg  daily PRN ?  ?  ?   ? ?Current Medications (10/24/2021):  This is the current hospital active medication list ?Current Facility-Administered Medications  ?Medication Dose Route Frequency Provider Last Rate Last Admin  ? amLODipine (NORVASC) tablet 5 mg  5 mg Oral Daily T, MD   5 mg at 10/24/21 [employer]  ? aspirin EC tablet 81 mg  81 mg Oral Daily 12/24/21 T, MD   81 mg at 10/24/21 [employer]  ? benazepril (LOTENSIN) tablet 20 mg  20 mg Oral QHS 12/24/21 T, MD   20 mg at 10/23/21 2102  ? cefTRIAXone (ROCEPHIN) 1 g in sodium chloride 0.9 % 100 mL IVPB  1 g Intravenous Q24H 12/23/21, MD   Stopping previously hung infusion at 10/23/21 1700  ? chlorhexidine (PERIDEX) 0.12 % solution 15 mL  15 mL Mouth/Throat BID Zannie Cove T, MD   15 mL at 10/24/21 0919  ? divalproex (DEPAKOTE) DR tablet 125 mg  125 mg Oral BID [employer], MD   125 mg  at 10/24/21 0919  ? fluticasone (FLONASE) 50 MCG/ACT nasal spray 2 spray  2 spray Each Nare Daily Mikey CollegeZhang, Ping T, MD   2 spray at 10/24/21 0919  ? gabapentin (NEURONTIN) capsule 200 mg  200 mg Oral QHS Mikey CollegeZhang, Ping T, MD   200 mg at 10/23/21 2101  ? heparin injection 5,000 Units  5,000 Units Subcutaneous Q12H Emeline GeneralZhang, Ping T, MD   5,000 Units at 10/24/21 [id number]  ? LORazepam (ATIVAN) tablet 0.5 mg  0.5 mg Oral Daily PRN Mikey CollegeZhang, Ping T, MD      ? meloxicam [facility](MOBIC) tablet 15 mg  15 mg Oral Daily Mikey CollegeZhang, Ping T, MD   15 mg at 10/24/21 [id number]  ? methimazole (TAPAZOLE) tablet 5 mg  5 mg Oral Daily Mikey CollegeZhang, Ping T, MD   5 mg at 10/24/21 [id number]  ?  multivitamin with minerals tablet 1 tablet  1 tablet Oral Daily Emeline GeneralZhang, Ping T, MD   1 tablet at 10/24/21 [phone]  ? oxybutynin (DITROPAN-XL) 24 hr tablet 10 mg  10 mg Oral QHS Mikey CollegeZhang, Ping T, MD   10 mg at 10/23/21 2102  ? pantoprazole (PROTONIX) EC tablet 40 mg  40 mg Oral Daily Mikey CollegeZhang, Ping T, MD   40 mg at 10/24/21 [id number]  ? PARoxetine (PAXIL) tablet 30 mg  30 mg Oral QHS Mikey CollegeZhang, Ping T, MD   30 mg at 10/23/21 2102  ? potassium chloride SA (KLOR-CON M) CR tablet 40 mEq  40 mEq Oral Once Pokhrel, Laxman, MD      ? risperiDONE (RISPERDAL) tablet 0.5 mg  0.5 mg Oral BID Mikey CollegeZhang, Ping T, MD   0.5 mg at 10/24/21 [id number]  ? risperiDONE (RISPERDAL) tablet 1 mg  1 mg Oral QHS Mikey CollegeZhang, Ping T, MD   1 mg at 10/23/21 2102  ? sodium chloride (MURO 128) 5 % ophthalmic solution 1 drop  1 drop Both Eyes TID Mikey CollegeZhang, Ping T, MD   1 drop at 10/24/21 0919  ? sodium chloride flush (NS) 0.9 % injection 10-40 mL  10-40 mL Intracatheter Q12H Emeline GeneralZhang, Ping T, MD   10 mL at 10/24/21 0920  ? sodium chloride flush (NS) 0.9 % injection 10-40 mL  10-40 mL Intracatheter PRN Mikey CollegeZhang, Ping T, MD      ? sodium chloride tablet 1 g  1 g Oral Q M,W,F Emeline GeneralZhang, Ping T, MD   1 g at 10/24/21 [id number]  ? ? ? ?Discharge Medications: ?STOP taking these medications   ?  ?cephALEXin 250 MG capsule ?Commonly known as: KEFLEX ?   ?  ?   ?  ?TAKE these medications   ?  ?amLODipine 5 MG tablet ?Commonly known as: NORVASC ?Take 5 mg by mouth daily. ?   ?ammonium lactate 12 % lotion ?Commonly known as: LAC-HYDRIN ?Apply 1 application. topically at bedtime. ?   ?aspirin 81 MG tablet ?Take 81 mg by mouth daily. ?   ?benazepril 20 MG tablet ?Commonly known as: LOTENSIN ?Take 20 mg by mouth at bedtime. ?   ?cefdinir 300 MG capsule ?Commonly known as: OMNICEF ?Take 1 capsule (300 mg total) by mouth 2 (two) times daily for 3 days. ?   ?chlorhexidine 0.12 % solution ?Commonly known as: PERIDEX ?Use as directed 15 mLs in the mouth or throat 2 (two) times daily. ?   ?divalproex 125 MG DR  tablet ?Commonly known as: DEPAKOTE ?Take 125 mg by mouth 2 (two) times daily. ?   ?fluticasone 50 MCG/ACT nasal spray ?Commonly known as: FLONASE ?Place  2 sprays into both nostrils daily. ?   ?gabapentin 100 MG capsule ?Commonly known as: NEURONTIN ?Take 200 mg by mouth at bedtime. ?   ?LORazepam 0.5 MG tablet ?Commonly known as: ATIVAN ?Take 0.5 mg by mouth daily as needed for anxiety. ?   ?meloxicam 15 MG tablet ?Commonly known as: MOBIC ?Take 15 mg by mouth daily. ?   ?methimazole 5 MG tablet ?Commonly known as: TAPAZOLE ?Take 5 mg by mouth daily. ?   ?multivitamin with minerals Tabs tablet ?Take 1 tablet by mouth daily. ?   ?omeprazole 20 MG capsule ?Commonly known as: PRILOSEC ?Take 20 mg by mouth daily. ?   ?oxybutynin 10 MG 24 hr tablet ?Commonly known as: DITROPAN-XL ?Take 10 mg by mouth at bedtime. ?   ?PARoxetine 30 MG tablet ?Commonly known as: PAXIL ?Take 30 mg by mouth at bedtime. ?   ?potassium chloride SA 20 MEQ tablet ?Commonly known as: KLOR-CON M ?Take 1 tablet (20 mEq total) by mouth daily for 5 days. ?   ?risperiDONE 1 MG tablet ?Commonly known as: RISPERDAL ?Take 1 mg by mouth at bedtime. ?   ?risperiDONE 0.5 MG tablet ?Commonly known as: RISPERDAL ?Take 0.5 mg by mouth 2 (two) times daily with breakfast and lunch. ?   ?sodium chloride 1 g tablet ?Take 1 g by mouth every Monday, Wednesday, and Friday. ?   ?sodium chloride 5 % ophthalmic solution ?Commonly known as: MURO 128 ?Place 1 drop into both eyes 3 (three) times daily.  ? ? ?Relevant Imaging Results: ? ?Relevant Lab Results: ? ? ?Additional Information ?SS#: [id number] ? ?Baldemar Lenis, LCSW ? ? ? ? ?

## 2021-10-24 NOTE — TOC Transition Note (Signed)
Transition of Care (TOC) - CM/SW Discharge Note ? ? ?Patient Details  ?Name: Amy Solis ?MRN: [id number] ?Date of Birth: 09/07/1950 ? ?Transition of Care [facility]) CM/SW Contact:  ?Baldemar Lenis, LCSW ?Phone Number: ?10/24/2021, 12:11 PM ? ? ?Clinical Narrative:   CSW confirmed with MD that patient ready to return to ALF today, sent discharge information to [facility] CSW left voicemail for HCPOA about return to ALF. Transport scheduled with [facility] for next available. ? ?Nurse to call report to [phone]. ? ? ? ?Final next level of care: Assisted Living ?Barriers to Discharge: Barriers Resolved ? ? ?Patient Goals and CMS Choice ?Patient states their goals for this hospitalization and ongoing recovery are:: patient unable to participate in goal setting, not oriented ?CMS Medicare.gov Compare Post Acute Care list provided to:: Patient Represenative (must comment) ?Choice offered to / list presented to : [facility] POA / Guardian ? ?Discharge Placement ?  ?           ?Patient chooses bed at: [facility] ?Patient to be transferred to facility by: [facility] ?Name of family member notified: Left voicemail for Arvilla Market ?Patient and family notified of of transfer: 10/24/21 ? ?Discharge Plan and Services ?  ?  ?Post Acute Care Choice: Nursing Home          ?  ?  ?  ?  ?  ?  ?  ?  ?  ?  ? ?Social Determinants of Health (SDOH) Interventions ?  ? ? ?Readmission Risk Interventions ?   ? View : No data to display.  ?  ?  ?  ? ? ? ? ? ?

## 2021-10-25 LAB — CULTURE, BLOOD (ROUTINE X 2)
Culture: NO GROWTH
Culture: NO GROWTH
Special Requests: ADEQUATE

## 2021-10-26 ENCOUNTER — Emergency Department (HOSPITAL_COMMUNITY): Payer: Medicare Other

## 2021-10-26 ENCOUNTER — Inpatient Hospital Stay (HOSPITAL_COMMUNITY)
Admission: EM | Admit: 2021-10-26 | Discharge: 2021-10-28 | DRG: 193 | Disposition: A | Discharge status: Home / Self Care | Payer: Medicare Other | Source: Skilled Nursing Facility | Attending: Internal Medicine | Admitting: Internal Medicine

## 2021-10-26 ENCOUNTER — Other Ambulatory Visit: Payer: Self-pay

## 2021-10-26 ENCOUNTER — Encounter (HOSPITAL_COMMUNITY): Payer: Self-pay

## 2021-10-26 DIAGNOSIS — Z79899 Other long term (current) drug therapy: Secondary | ICD-10-CM

## 2021-10-26 DIAGNOSIS — Z90710 Acquired absence of both cervix and uterus: Secondary | ICD-10-CM | POA: Diagnosis not present

## 2021-10-26 DIAGNOSIS — M810 Age-related osteoporosis without current pathological fracture: Secondary | ICD-10-CM | POA: Diagnosis present

## 2021-10-26 DIAGNOSIS — Z87440 Personal history of urinary (tract) infections: Secondary | ICD-10-CM

## 2021-10-26 DIAGNOSIS — F0153 Vascular dementia, unspecified severity, with mood disturbance: Secondary | ICD-10-CM | POA: Diagnosis present

## 2021-10-26 DIAGNOSIS — J189 Pneumonia, unspecified organism: Principal | ICD-10-CM | POA: Diagnosis present

## 2021-10-26 DIAGNOSIS — I10 Essential (primary) hypertension: Secondary | ICD-10-CM | POA: Diagnosis present

## 2021-10-26 DIAGNOSIS — Z8249 Family history of ischemic heart disease and other diseases of the circulatory system: Secondary | ICD-10-CM

## 2021-10-26 DIAGNOSIS — E0590 Thyrotoxicosis, unspecified without thyrotoxic crisis or storm: Secondary | ICD-10-CM | POA: Diagnosis present

## 2021-10-26 DIAGNOSIS — F0154 Vascular dementia, unspecified severity, with anxiety: Secondary | ICD-10-CM | POA: Diagnosis present

## 2021-10-26 DIAGNOSIS — J918 Pleural effusion in other conditions classified elsewhere: Secondary | ICD-10-CM | POA: Diagnosis present

## 2021-10-26 DIAGNOSIS — Z9981 Dependence on supplemental oxygen: Secondary | ICD-10-CM | POA: Diagnosis not present

## 2021-10-26 DIAGNOSIS — Z791 Long term (current) use of non-steroidal anti-inflammatories (NSAID): Secondary | ICD-10-CM | POA: Diagnosis not present

## 2021-10-26 DIAGNOSIS — Z66 Do not resuscitate: Secondary | ICD-10-CM | POA: Diagnosis present

## 2021-10-26 DIAGNOSIS — Z7982 Long term (current) use of aspirin: Secondary | ICD-10-CM | POA: Diagnosis not present

## 2021-10-26 DIAGNOSIS — Z20822 Contact with and (suspected) exposure to covid-19: Secondary | ICD-10-CM | POA: Diagnosis present

## 2021-10-26 DIAGNOSIS — G9341 Metabolic encephalopathy: Secondary | ICD-10-CM | POA: Diagnosis present

## 2021-10-26 DIAGNOSIS — F70 Mild intellectual disabilities: Secondary | ICD-10-CM | POA: Diagnosis present

## 2021-10-26 DIAGNOSIS — E785 Hyperlipidemia, unspecified: Secondary | ICD-10-CM | POA: Diagnosis present

## 2021-10-26 DIAGNOSIS — J9611 Chronic respiratory failure with hypoxia: Secondary | ICD-10-CM | POA: Diagnosis present

## 2021-10-26 DIAGNOSIS — F0150 Vascular dementia without behavioral disturbance: Secondary | ICD-10-CM | POA: Diagnosis present

## 2021-10-26 LAB — RESP PANEL BY RT-PCR (FLU A&B, COVID) ARPGX2
Influenza A by PCR: NEGATIVE
Influenza B by PCR: NEGATIVE
SARS Coronavirus 2 by RT PCR: NEGATIVE

## 2021-10-26 LAB — AMMONIA: Ammonia: 55 umol/L — ABNORMAL HIGH (ref 9–35)

## 2021-10-26 LAB — COMPREHENSIVE METABOLIC PANEL
ALT: 24 U/L (ref 0–44)
AST: 32 U/L (ref 15–41)
Albumin: 2.8 g/dL — ABNORMAL LOW (ref 3.5–5.0)
Alkaline Phosphatase: 75 U/L (ref 38–126)
Anion gap: 5 (ref 5–15)
BUN: 14 mg/dL (ref 8–23)
CO2: 29 mmol/L (ref 22–32)
Calcium: 8.7 mg/dL — ABNORMAL LOW (ref 8.9–10.3)
Chloride: 109 mmol/L (ref 98–111)
Creatinine, Ser: 1.26 mg/dL — ABNORMAL HIGH (ref 0.44–1.00)
GFR, Estimated: 45 mL/min — ABNORMAL LOW (ref >60)
Glucose, Bld: 95 mg/dL (ref 70–99)
Potassium: 4.4 mmol/L (ref 3.5–5.1)
Sodium: 143 mmol/L (ref 135–145)
Total Bilirubin: 0.9 mg/dL (ref 0.3–1.2)
Total Protein: 6 g/dL — ABNORMAL LOW (ref 6.5–8.1)

## 2021-10-26 LAB — CBC WITH DIFFERENTIAL/PLATELET
Abs Immature Granulocytes: 0.06 10*3/uL (ref 0.00–0.07)
Basophils Absolute: 0.1 10*3/uL (ref 0.0–0.1)
Basophils Relative: 1 %
Eosinophils Absolute: 0.4 10*3/uL (ref 0.0–0.5)
Eosinophils Relative: 7 %
HCT: 39.3 % (ref 36.0–46.0)
Hemoglobin: 12.5 g/dL (ref 12.0–15.0)
Immature Granulocytes: 1 %
Lymphocytes Relative: 19 %
Lymphs Abs: 1.1 10*3/uL (ref 0.7–4.0)
MCH: 31.4 pg (ref 26.0–34.0)
MCHC: 31.8 g/dL (ref 30.0–36.0)
MCV: 98.7 fL (ref 80.0–100.0)
Monocytes Absolute: 0.8 10*3/uL (ref 0.1–1.0)
Monocytes Relative: 14 %
Neutro Abs: 3.5 10*3/uL (ref 1.7–7.7)
Neutrophils Relative %: 58 %
Platelets: 286 10*3/uL (ref 150–400)
RBC: 3.98 MIL/uL (ref 3.87–5.11)
RDW: 14.3 % (ref 11.5–15.5)
WBC: 5.9 10*3/uL (ref 4.0–10.5)
nRBC: 0 % (ref 0.0–0.2)

## 2021-10-26 LAB — I-STAT VENOUS BLOOD GAS, ED
Acid-Base Excess: 6 mmol/L — ABNORMAL HIGH (ref 0.0–2.0)
Bicarbonate: 31.7 mmol/L — ABNORMAL HIGH (ref 20.0–28.0)
Calcium, Ion: 1.09 mmol/L — ABNORMAL LOW (ref 1.15–1.40)
HCT: 37 % (ref 36.0–46.0)
Hemoglobin: 12.6 g/dL (ref 12.0–15.0)
O2 Saturation: 98 %
Potassium: 4.4 mmol/L (ref 3.5–5.1)
Sodium: 142 mmol/L (ref 135–145)
TCO2: 33 mmol/L — ABNORMAL HIGH (ref 22–32)
pCO2, Ven: 49.5 mmHg (ref 44–60)
pH, Ven: 7.415 (ref 7.25–7.43)
pO2, Ven: 112 mmHg — ABNORMAL HIGH (ref 32–45)

## 2021-10-26 LAB — CBG MONITORING, ED: Glucose-Capillary: 83 mg/dL (ref 70–99)

## 2021-10-26 LAB — PROCALCITONIN: Procalcitonin: <0.1 ng/mL

## 2021-10-26 LAB — LACTIC ACID, PLASMA
Lactic Acid, Venous: 0.7 mmol/L (ref 0.5–1.9)
Lactic Acid, Venous: 1 mmol/L (ref 0.5–1.9)

## 2021-10-26 LAB — VALPROIC ACID LEVEL: Valproic Acid Lvl: 30 ug/mL — ABNORMAL LOW (ref 50.0–100.0)

## 2021-10-26 MED ORDER — GABAPENTIN 100 MG PO CAPS
200.0000 mg | ORAL_CAPSULE | Freq: Every day | ORAL | Status: DC
  Administered 2021-10-26: 200 mg via ORAL
  Filled 2021-10-26: qty 2

## 2021-10-26 MED ORDER — SODIUM CHLORIDE 0.9 % IV SOLN
2.0000 g | INTRAVENOUS | Status: DC
  Administered 2021-10-27 – 2021-10-28 (×2): 2 g via INTRAVENOUS
  Filled 2021-10-26 (×2): qty 20

## 2021-10-26 MED ORDER — SODIUM CHLORIDE 0.9 % IV SOLN
1.0000 g | Freq: Once | INTRAVENOUS | Status: AC
  Administered 2021-10-26: 1 g via INTRAVENOUS
  Filled 2021-10-26: qty 10

## 2021-10-26 MED ORDER — BISACODYL 5 MG PO TBEC: 5.0000 mg | DELAYED_RELEASE_TABLET | Freq: Every day | ORAL | Status: DC | PRN

## 2021-10-26 MED ORDER — SODIUM CHLORIDE 0.9 % IV SOLN: INTRAVENOUS | Status: DC

## 2021-10-26 MED ORDER — ACETAMINOPHEN 650 MG RE SUPP: 650.0000 mg | Freq: Four times a day (QID) | RECTAL | Status: DC | PRN

## 2021-10-26 MED ORDER — CHLORHEXIDINE GLUCONATE 0.12 % MT SOLN
15.0000 mL | Freq: Two times a day (BID) | OROMUCOSAL | Status: DC
  Administered 2021-10-26 – 2021-10-28 (×3): 15 mL via OROMUCOSAL
  Filled 2021-10-26 (×6): qty 15

## 2021-10-26 MED ORDER — ONDANSETRON HCL 4 MG/2ML IJ SOLN
4.0000 mg | Freq: Four times a day (QID) | INTRAMUSCULAR | Status: DC | PRN
Start: 2021-10-26 — End: 2021-10-28

## 2021-10-26 MED ORDER — OXYBUTYNIN CHLORIDE ER 10 MG PO TB24
10.0000 mg | ORAL_TABLET | Freq: Every day | ORAL | Status: DC
  Administered 2021-10-27 (×2): 10 mg via ORAL
  Filled 2021-10-26 (×4): qty 1

## 2021-10-26 MED ORDER — PANTOPRAZOLE SODIUM 40 MG PO TBEC
40.0000 mg | DELAYED_RELEASE_TABLET | Freq: Every day | ORAL | Status: DC
  Administered 2021-10-26 – 2021-10-28 (×3): 40 mg via ORAL
  Filled 2021-10-26 (×3): qty 1

## 2021-10-26 MED ORDER — OXYCODONE HCL 5 MG PO TABS: 5.0000 mg | ORAL_TABLET | ORAL | Status: DC | PRN

## 2021-10-26 MED ORDER — SODIUM CHLORIDE 0.9 % IV SOLN
500.0000 mg | Freq: Once | INTRAVENOUS | Status: AC
  Administered 2021-10-26: 500 mg via INTRAVENOUS
  Filled 2021-10-26: qty 5

## 2021-10-26 MED ORDER — ASPIRIN 81 MG PO CHEW
81.0000 mg | CHEWABLE_TABLET | Freq: Every day | ORAL | Status: DC
  Administered 2021-10-26 – 2021-10-28 (×3): 81 mg via ORAL
  Filled 2021-10-26 (×3): qty 1

## 2021-10-26 MED ORDER — RISPERIDONE 1 MG PO TABS
1.0000 mg | ORAL_TABLET | Freq: Every day | ORAL | Status: DC
  Administered 2021-10-27: 1 mg via ORAL
  Filled 2021-10-26 (×2): qty 1

## 2021-10-26 MED ORDER — METHIMAZOLE 5 MG PO TABS
5.0000 mg | ORAL_TABLET | Freq: Every day | ORAL | Status: DC
  Administered 2021-10-27 – 2021-10-28 (×2): 5 mg via ORAL
  Filled 2021-10-26 (×3): qty 1

## 2021-10-26 MED ORDER — SODIUM CHLORIDE 0.9 % IV SOLN
500.0000 mg | INTRAVENOUS | Status: DC
  Administered 2021-10-27 – 2021-10-28 (×2): 500 mg via INTRAVENOUS
  Filled 2021-10-26 (×2): qty 5

## 2021-10-26 MED ORDER — AMLODIPINE BESYLATE 5 MG PO TABS
5.0000 mg | ORAL_TABLET | Freq: Every day | ORAL | Status: DC
  Administered 2021-10-26 – 2021-10-28 (×3): 5 mg via ORAL
  Filled 2021-10-26 (×3): qty 1

## 2021-10-26 MED ORDER — PAROXETINE HCL 30 MG PO TABS
30.0000 mg | ORAL_TABLET | Freq: Every day | ORAL | Status: DC
  Administered 2021-10-27 – 2021-10-28 (×2): 30 mg via ORAL
  Filled 2021-10-26 (×5): qty 1

## 2021-10-26 MED ORDER — POLYETHYLENE GLYCOL 3350 17 G PO PACK: 17.0000 g | PACK | Freq: Every day | ORAL | Status: DC | PRN

## 2021-10-26 MED ORDER — LORAZEPAM 0.5 MG PO TABS
0.5000 mg | ORAL_TABLET | Freq: Every day | ORAL | Status: DC
  Administered 2021-10-26: 0.5 mg via ORAL
  Filled 2021-10-26 (×2): qty 1

## 2021-10-26 MED ORDER — ONDANSETRON HCL 4 MG PO TABS: 4.0000 mg | ORAL_TABLET | Freq: Four times a day (QID) | ORAL | Status: DC | PRN

## 2021-10-26 MED ORDER — GUAIFENESIN ER 600 MG PO TB12: 600.0000 mg | ORAL_TABLET | Freq: Two times a day (BID) | ORAL | Status: DC | PRN

## 2021-10-26 MED ORDER — DIVALPROEX SODIUM 125 MG PO DR TAB
125.0000 mg | DELAYED_RELEASE_TABLET | Freq: Two times a day (BID) | ORAL | Status: DC
  Administered 2021-10-27 – 2021-10-28 (×4): 125 mg via ORAL
  Filled 2021-10-26 (×7): qty 1

## 2021-10-26 MED ORDER — HYDRALAZINE HCL 20 MG/ML IJ SOLN: 5.0000 mg | INTRAMUSCULAR | Status: DC | PRN

## 2021-10-26 MED ORDER — DOCUSATE SODIUM 100 MG PO CAPS
100.0000 mg | ORAL_CAPSULE | Freq: Two times a day (BID) | ORAL | Status: DC
  Administered 2021-10-26 – 2021-10-28 (×5): 100 mg via ORAL
  Filled 2021-10-26 (×5): qty 1

## 2021-10-26 MED ORDER — MORPHINE SULFATE (PF) 2 MG/ML IV SOLN
2.0000 mg | INTRAVENOUS | Status: DC | PRN
  Administered 2021-10-27: 2 mg via INTRAVENOUS
  Filled 2021-10-26: qty 1

## 2021-10-26 MED ORDER — ENOXAPARIN SODIUM 40 MG/0.4ML IJ SOSY
40.0000 mg | PREFILLED_SYRINGE | Freq: Every day | INTRAMUSCULAR | Status: DC
  Administered 2021-10-26 – 2021-10-28 (×3): 40 mg via SUBCUTANEOUS
  Filled 2021-10-26 (×3): qty 0.4

## 2021-10-26 MED ORDER — FLUTICASONE PROPIONATE 50 MCG/ACT NA SUSP
2.0000 | Freq: Every day | NASAL | Status: DC
  Administered 2021-10-28: 2 via NASAL
  Filled 2021-10-26: qty 16

## 2021-10-26 MED ORDER — SODIUM CHLORIDE 0.9 % IV BOLUS
1000.0000 mL | Freq: Once | INTRAVENOUS | Status: AC
  Administered 2021-10-26: 1000 mL via INTRAVENOUS

## 2021-10-26 MED ORDER — RISPERIDONE 0.5 MG PO TABS
0.5000 mg | ORAL_TABLET | Freq: Two times a day (BID) | ORAL | Status: DC
  Administered 2021-10-26: 0.5 mg via ORAL
  Filled 2021-10-26 (×4): qty 1

## 2021-10-26 MED ORDER — SODIUM CHLORIDE (HYPERTONIC) 5 % OP SOLN
1.0000 [drp] | Freq: Three times a day (TID) | OPHTHALMIC | Status: DC
  Administered 2021-10-27 – 2021-10-28 (×5): 1 [drp] via OPHTHALMIC
  Filled 2021-10-26 (×2): qty 15

## 2021-10-26 MED ORDER — ACETAMINOPHEN 325 MG PO TABS: 650.0000 mg | ORAL_TABLET | Freq: Four times a day (QID) | ORAL | Status: DC | PRN

## 2021-10-26 MED ORDER — ALBUTEROL SULFATE (2.5 MG/3ML) 0.083% IN NEBU
2.5000 mg | INHALATION_SOLUTION | RESPIRATORY_TRACT | Status: DC | PRN
Start: 2021-10-26 — End: 2021-10-28

## 2021-10-26 MED ORDER — SODIUM CHLORIDE 0.9 % IV BOLUS: 500.0000 mL | Freq: Once | INTRAVENOUS | Status: DC

## 2021-10-26 NOTE — ED Notes (Signed)
Pt hard stick. Notified phlebotomy for [facility] and [facility] ?

## 2021-10-26 NOTE — ED Triage Notes (Signed)
Pt to ED via EMS. Pt coming from [facility]. Pt's caregivers report pt being unresponsive / altered. Baseline GCS 15. Pt able to follow commands upon arrival to ED. Pt drowsy upon arrival to ED. Pt oriented upon arrival to ED. Pt denies pain. Pt denies any complaints. Pt on 3L [place] baseline.  ? ?EMS vitals: ?HR 88 ?131/77 ?95% 3L [place] ?16-18RR ?112 CBG ? ?18 LFA ? NS given by EMS en route ?

## 2021-10-26 NOTE — H&P (Signed)
?History and Physical  ? ? ?Patient: Amy Solis XBW:[id number] DOB: 1950/11/22 ?DOA: 10/26/2021 ?DOS: the patient was seen and examined on 10/26/2021 ?PCP: Florentina Jenny, MD  ?Patient coming from: ALF/ILF - [facility]; NOK: Alfonso Ramus, [phone] ? ? ?Chief Complaint: AMS ? ?HPI: Amy Solis is a 71 y.o. female with medical history significant of HTN; HLD; intellectual disability; vascular dementia; and depression presenting with AMS. She was last hospitalized from 4/4-10 with AMS thought to be related to recurrent UTI.  She is mostly unable to effectively answer questions, oriented to person and thinks she is at [facility]. She denies being on home O2 but recent dc summary lists 2L home O2.  I was unable to reach her guardian and left a voice mail message. ? ? ? ?ER Course:  May not need to stay.  Recently admitted for AMS, UTI.  Sounds like she was better.  On somnolent meds.  Somnolent this AM. Sats stable on home O2.  Slow to respond.  CXR read as L multilobar PNA, parapneumonic effusion.  Given IVF, Rocephin/Azithro.  She is DNR/DNI.  ?dc back to facility. ? ? ? ? ?Review of Systems: unable to review all systems due to the inability of the patient to answer questions. ?Past Medical History:  ?Diagnosis Date  ? Depressive disorder   ? Hyperlipemia   ? Hypertension   ? Mild mental retardation   ? Osteoporosis   ? ?Past Surgical History:  ?Procedure Laterality Date  ? ABDOMINAL HYSTERECTOMY    ? ?Social History:  reports that she has never smoked. She has never used smokeless tobacco. She reports that she does not drink alcohol and does not use drugs. ? ?No Known Allergies ? ?Family History  ?Problem Relation Age of Onset  ? Breast cancer Mother   ?     diagnosed in her 50's  ? Heart attack Father   ? Heart attack Brother   ? ? ?Prior to Admission medications   ?Medication Sig Start Date End Date Taking? Authorizing Provider  ?amLODipine (NORVASC) 5 MG tablet Take 5 mg by mouth daily.     [provider]  ?ammonium lactate (LAC-HYDRIN) 12 % lotion Apply 1 application. topically at bedtime.    [provider]  ?aspirin 81 MG tablet Take 81 mg by mouth daily.    [provider]  ?benazepril (LOTENSIN) 20 MG tablet Take 20 mg by mouth at bedtime.    [provider]  ?cefdinir (OMNICEF) 300 MG capsule Take 1 capsule (300 mg total) by mouth 2 (two) times daily for 3 days. 10/24/21 10/27/21  Pokhrel, Rebekah Chesterfield, MD  ?chlorhexidine (PERIDEX) 0.12 % solution Use as directed 15 mLs in the mouth or throat 2 (two) times daily.    [provider]  ?divalproex (DEPAKOTE) 125 MG DR tablet Take 125 mg by mouth 2 (two) times daily.    [provider]  ?fluticasone (FLONASE) 50 MCG/ACT nasal spray Place 2 sprays into both nostrils daily.    [provider]  ?gabapentin (NEURONTIN) 100 MG capsule Take 200 mg by mouth at bedtime.    [provider]  ?LORazepam (ATIVAN) 0.5 MG tablet Take 0.5 mg by mouth daily as needed for anxiety. 12/30/20   [provider]  ?meloxicam (MOBIC) 15 MG tablet Take 15 mg by mouth daily.    [provider]  ?methimazole (TAPAZOLE) 5 MG tablet Take 5 mg by mouth daily.    [provider]  ?Multiple Vitamin (MULTIVITAMIN  WITH MINERALS) TABS tablet Take 1 tablet by mouth daily.    [provider]  ?omeprazole (PRILOSEC) 20 MG capsule Take 20 mg by mouth daily. 06/13/13   [provider]  ?oxybutynin (DITROPAN-XL) 10 MG 24 hr tablet Take 10 mg by mouth at bedtime.    [provider]  ?PARoxetine (PAXIL) 30 MG tablet Take 30 mg by mouth at bedtime.    [provider]  ?potassium chloride SA (KLOR-CON M) 20 MEQ tablet Take 1 tablet (20 mEq total) by mouth daily for 5 days. 10/24/21 10/29/21  Pokhrel, Rebekah ChesterfieldLaxman, MD  ?risperiDONE (RISPERDAL) 0.5 MG tablet Take 0.5 mg by mouth 2 (two) times daily with breakfast and lunch.    [provider]  ?risperiDONE (RISPERDAL) 1  MG tablet Take 1 mg by mouth at bedtime.    [provider]  ?sodium chloride (MURO 128) 5 % ophthalmic solution Place 1 drop into both eyes 3 (three) times daily.    [provider]  ?sodium chloride 1 g tablet Take 1 g by mouth every Monday, Wednesday, and Friday.    [provider]  ? ? ?Physical Exam: ?Vitals:  ? 10/26/21 1300 10/26/21 1313 10/26/21 1315 10/26/21 1326  ?BP: (!) 142/73 (!) 135/91 (!) 149/77   ?Pulse: (!) 57  (!) 59   ?Resp: 15  13   ?Temp:    98.6 ?F (37 ?C)  ?TempSrc:    Oral  ?SpO2: 96%  99%   ? ?General:  Appears calm and comfortable and is in NAD, on 3L [place] O2 ?Eyes:  EOMI, normal lids, iris ?ENT:  grossly normal hearing, lips & tongue, mmm ?Neck:  no LAD, masses or thyromegaly ?Cardiovascular:  RRR, no m/r/g. No LE edema.  ?Respiratory:   Diffuse left-sided rhonchi.  Normal respiratory effort.  3L [place] O2 ?Abdomen:  soft, NT, ND ?Skin:  no rash or induration seen on limited exam ?Musculoskeletal:  no bony abnormality ?Psychiatric:  pleasantly confused mood and affect, speech sparse, AOx1 ?Neurologic:  unable to effectively perform ? ? ?Radiological Exams on Admission: ?Independently reviewed - see discussion in A/P where applicable ? ?DG Chest Port 1 View ? ?Result Date: 10/26/2021 ?CLINICAL DATA:  71 year old female with history of altered mental status. Unresponsive. EXAM: PORTABLE CHEST 1 VIEW COMPARISON:  Chest x-ray 09/30/2021. FINDINGS: Lung volumes are low. Patchy ill-defined opacities and areas of interstitial prominence are noted throughout the mid to lower left lung. Small left pleural effusion. Severe elevation of the right hemidiaphragm. No pneumothorax. No evidence of pulmonary edema. Heart size is upper limits of normal. Upper mediastinal contours are distorted by patient positioning. Atherosclerotic calcifications in the thoracic aorta. IMPRESSION: 1. Multilobar left-sided bronchopneumonia. Small left parapneumonic pleural effusion. 2. Severe elevation  of the right hemidiaphragm. 3. Low lung volumes. 4. Aortic atherosclerosis. Electronically Signed   By: Trudie Reedaniel  Entrikin M.D.   On: 10/26/2021 07:21   ? ?EKG: Independently reviewed.  NSR with rate 73; nonspecific ST changes with no evidence of acute ischemia ? ? ?Labs on Admission: I have personally reviewed the available labs and imaging studies at the time of the admission. ? ?Pertinent labs:   ? ?ABG: 7.415/49.5/112/31.7 ?BUN 14/Creatinine 1.26/GFR 45; 8/0.82/>60 on 4/9 ?Albumin 2.8 ?NH4 55 ?Normal CBC ?COVID/flu negative ? ? ? ?Assessment and Plan: ?Principal Problem: ?  Multifocal pneumonia ?Active Problems: ?  Hyperthyroidism ?  Vascular dementia (HCC) ?  Chronic respiratory failure with hypoxia (HCC) ?  Essential hypertension ?  DNR (do not resuscitate) ?  ? ?  Multifocal PNA ?-Patient presenting with AMS, found to have mildly decreased oxygen saturation (92% despite [place] O2), and diffuse infiltrates in left lung on chest x-ray ?-She does not appear to have had a CXR during her prior hospitalization (for AMS) ?-She appears to be at/near baseline for mental status currently but is still requiring 3L [place] O2 ?-With her cognitive impairment, aspiration is a consideration; will request ST swallow evaluation ?-Influenza negative. ?-COVID-19 negative. ?-Will order lower respiratory tract procalcitonin level.   >0.5 indicates infection and >>0.5 indicates more serious disease.  As the procalcitonin level normalizes, it will be reasonable to consider de-escalation of antibiotic coverage.  The sensitivity of procalcitonin is variable and should not be used alone to guide treatment. ?-CURB-65 score is 2 - will admit the patient to Med Surg. ?-Pneumonia Severity Index (PSI) is Class 4, 9% mortality. ?-Will start Azithromycin 500 mg IV daily and Rocephin ?-NS @ 75cc/hr ?-Fever control ?-Repeat CBC in am ?-Will add albuterol PRN ? ?Acute metabolic encephalopathy ?-Appears to be at her baseline at this time ?  ?Vascular  dementia/ Anxiety, depression ?-Continue home meds - risperidone, gabapentin, Depakote, paroxetine, Ativan ?  ?COPD/chronic respiratory failure ?-Continue oxygen by nasal cannula, normally on 2L ?  ?Essential hyp

## 2021-10-26 NOTE — Discharge Planning (Signed)
RNCM following for disposition needs.  RNCM contacted [facility] (current residence) to ensure pt may return upon discharge.  TOC team will continue to follow. ?Helana Macbride J. Lucretia Roers, RN, BSN, [place] [phone] ? ?

## 2021-10-26 NOTE — ED Provider Notes (Signed)
?[facility] ?Provider Note ? ? ?CSN: [id number] ?Arrival date & time: 10/26/21  [id number] ? ?  ? ?History ? ?Chief Complaint  ?Patient presents with  ? Altered Mental Status  ? ? ?Amy RossettiSusan F Solis is a 71 y.o. female.  She has a history of dementia level 5 caveat.  History of encephalopathy O2 requirement.  COPD.  Sent in from her facility by EMS for evaluation of altered mental status.  Last known well unclear but patient awoke this morning with decreased responsiveness.  Patient arousable to voice and denies any current symptoms.  She is chronically O2 dependent and on multiple medications.  DNR/DNI.  Per EMS baseline is awake alert conversant watches TV. ? ?The history is provided by the patient and the EMS personnel.  ?Altered Mental Status ?Presenting symptoms: partial responsiveness   ?Most recent episode:  Today ?Progression:  Unchanged ?Context: dementia and nursing home resident   ?Associated symptoms: no abdominal pain, no fever and no vomiting   ? ?  ? ?Home Medications ?Prior to Admission medications   ?Medication Sig Start Date End Date Taking? Authorizing Provider  ?amLODipine (NORVASC) 5 MG tablet Take 5 mg by mouth daily.    [provider]  ?ammonium lactate (LAC-HYDRIN) 12 % lotion Apply 1 application. topically at bedtime.    [provider]  ?aspirin 81 MG tablet Take 81 mg by mouth daily.    [provider]  ?benazepril (LOTENSIN) 20 MG tablet Take 20 mg by mouth at bedtime.    [provider]  ?cefdinir (OMNICEF) 300 MG capsule Take 1 capsule (300 mg total) by mouth 2 (two) times daily for 3 days. 10/24/21 10/27/21  Pokhrel, Rebekah ChesterfieldLaxman, MD  ?chlorhexidine (PERIDEX) 0.12 % solution Use as directed 15 mLs in the mouth or throat 2 (two) times daily.    [provider]  ?divalproex (DEPAKOTE) 125 MG DR tablet Take 125 mg by mouth 2 (two) times daily.    [provider]  ?fluticasone (FLONASE) 50 MCG/ACT nasal spray Place  2 sprays into both nostrils daily.    [provider]  ?gabapentin (NEURONTIN) 100 MG capsule Take 200 mg by mouth at bedtime.    [provider]  ?LORazepam (ATIVAN) 0.5 MG tablet Take 0.5 mg by mouth daily as needed for anxiety. 12/30/20   [provider]  ?meloxicam (MOBIC) 15 MG tablet Take 15 mg by mouth daily.    [provider]  ?methimazole (TAPAZOLE) 5 MG tablet Take 5 mg by mouth daily.    [provider]  ?Multiple Vitamin (MULTIVITAMIN WITH MINERALS) TABS tablet Take 1 tablet by mouth daily.    [provider]  ?omeprazole (PRILOSEC) 20 MG capsule Take 20 mg by mouth daily. 06/13/13   [provider]  ?oxybutynin (DITROPAN-XL) 10 MG 24 hr tablet Take 10 mg by mouth at bedtime.    [provider]  ?PARoxetine (PAXIL) 30 MG tablet Take 30 mg by mouth at bedtime.    [provider]  ?potassium chloride SA (KLOR-CON M) 20 MEQ tablet Take 1 tablet (20 mEq total) by mouth daily for 5 days. 10/24/21 10/29/21  Pokhrel, Rebekah ChesterfieldLaxman, MD  ?risperiDONE (RISPERDAL) 0.5 MG tablet Take 0.5 mg by mouth 2 (two) times daily with breakfast and lunch.    [provider]  ?risperiDONE (RISPERDAL) 1 MG tablet Take 1 mg by mouth at bedtime.    [provider]  ?sodium chloride (MURO 128) 5 % ophthalmic solution Place  1 drop into both eyes 3 (three) times daily.    [provider]  ?sodium chloride 1 g tablet Take 1 g by mouth every Monday, Wednesday, and Friday.    [provider]  ?   ? ?Allergies    ?Patient has no known allergies.   ? ?Review of Systems   ?Review of Systems  ?Unable to perform ROS: Dementia  ?Constitutional:  Negative for fever.  ?Gastrointestinal:  Negative for abdominal pain and vomiting.  ? ?Physical Exam ?Updated Vital Signs ?BP 128/74 (BP Location: Right Arm)   Pulse 82   Temp 98.9 ?F (37.2 ?C) (Oral)   Resp 15   SpO2 92%  ?Physical Exam ?Vitals and nursing note reviewed.  ?Constitutional:    ?   General: She is not in acute distress. ?   Appearance: Normal appearance. She is well-developed.  ?HENT:  ?   Head: Normocephalic and atraumatic.  ?Eyes:  ?   Conjunctiva/sclera: Conjunctivae normal.  ?Cardiovascular:  ?   Rate and Rhythm: Normal rate and regular rhythm.  ?   Heart sounds: No murmur heard. ?Pulmonary:  ?   Effort: Pulmonary effort is normal. No respiratory distress.  ?   Breath sounds: Normal breath sounds.  ?Abdominal:  ?   Palpations: Abdomen is soft.  ?   Tenderness: There is no abdominal tenderness. There is no guarding or rebound.  ?Musculoskeletal:     ?   General: No swelling. Normal range of motion.  ?   Cervical back: Neck supple.  ?   Right lower leg: No edema.  ?   Left lower leg: No edema.  ?Skin: ?   General: Skin is warm and dry.  ?   Capillary Refill: Capillary refill takes less than 2 seconds.  ?Neurological:  ?   General: No focal deficit present.  ?   Comments: Patient arousable to voice following commands.  Moving all extremities without any focal deficits.  Has some generalized weakness in her lower extremities symmetric.  ? ? ?ED Results / Procedures / Treatments   ?Labs ?(all labs ordered are listed, but only abnormal results are displayed) ?Labs Reviewed  ?COMPREHENSIVE METABOLIC PANEL - Abnormal; Notable for the following components:  ?    Result Value  ? Creatinine, Ser 1.26 (*)   ? Calcium 8.7 (*)   ? Total Protein 6.0 (*)   ? Albumin 2.8 (*)   ? GFR, Estimated 45 (*)   ? All other components within normal limits  ?AMMONIA - Abnormal; Notable for the following components:  ? Ammonia 55 (*)   ? All other components within normal limits  ?VALPROIC ACID LEVEL - Abnormal; Notable for the following components:  ? Valproic Acid Lvl 30 (*)   ? All other components within normal limits  ?I-STAT VENOUS BLOOD GAS, ED - Abnormal; Notable for the following components:  ? pO2, Ven 112 (*)   ? Bicarbonate 31.7 (*)   ? TCO2 33 (*)   ? Acid-Base Excess 6.0 (*)   ? Calcium, Ion 1.09  (*)   ? All other components within normal limits  ?RESP PANEL BY RT-PCR (FLU A&B, COVID) ARPGX2  ?CULTURE, BLOOD (ROUTINE X 2)  ?CULTURE, BLOOD (ROUTINE X 2)  ?EXPECTORATED SPUTUM ASSESSMENT W GRAM STAIN, RFLX TO RESP C  ?CBC WITH DIFFERENTIAL/PLATELET  ?LACTIC ACID, PLASMA  ?LACTIC ACID, PLASMA  ?PROCALCITONIN  ?CBG MONITORING, ED  ? ? ?EKG ?EKG Interpretation ? ?Date/Time:  Wednesday October 26 2021 07:10:29 EDT ?Ventricular Rate:  73 ?PR Interval:  165 ?QRS Duration: 87 ?QT Interval:  410 ?QTC Calculation: 452 ?R Axis:   54 ?Text Interpretation: Sinus rhythm Nonspecific T abnrm, anterolateral leads No significant change since prior 3/23 Confirmed by Meridee Score [phone]) on 10/26/2021 7:17:17 AM ? ?Radiology ?DG Chest Port 1 View ? ?Result Date: 10/26/2021 ?CLINICAL DATA:  71 year old female with history of altered mental status. Unresponsive. EXAM: PORTABLE CHEST 1 VIEW COMPARISON:  Chest x-ray 09/30/2021. FINDINGS: Lung volumes are low. Patchy ill-defined opacities and areas of interstitial prominence are noted throughout the mid to lower left lung. Small left pleural effusion. Severe elevation of the right hemidiaphragm. No pneumothorax. No evidence of pulmonary edema. Heart size is upper limits of normal. Upper mediastinal contours are distorted by patient positioning. Atherosclerotic calcifications in the thoracic aorta. IMPRESSION: 1. Multilobar left-sided bronchopneumonia. Small left parapneumonic pleural effusion. 2. Severe elevation of the right hemidiaphragm. 3. Low lung volumes. 4. Aortic atherosclerosis. Electronically Signed   By: Trudie Reed M.D.   On: 10/26/2021 07:21   ? ?Procedures ?Procedures  ? ? ?Medications Ordered in ED ?Medications  ?aspirin chewable tablet 81 mg (81 mg Oral Given 10/26/21 1314)  ?amLODipine (NORVASC) tablet 5 mg (5 mg Oral Given 10/26/21 1313)  ?LORazepam (ATIVAN) tablet 0.5 mg (0.5 mg Oral Given 10/26/21 1315)  ?PARoxetine (PAXIL) tablet 30 mg (has no administration in  time range)  ?risperiDONE (RISPERDAL) tablet 0.5 mg (0.5 mg Oral Given 10/26/21 1312)  ?risperiDONE (RISPERDAL) tablet 1 mg (has no administration in time range)  ?methimazole (TAPAZOLE) tablet 5 mg (has no

## 2021-10-26 NOTE — Evaluation (Signed)
Clinical/Bedside Swallow Evaluation ?Patient Details  ?Name: Amy Solis ?MRN: [id number] ?Date of Birth: [birth year] ? ?Today's Date: 10/26/2021 ?Time: SLP Start Time (ACUTE ONLY): 1625 SLP Stop Time (ACUTE ONLY): 1650 ?SLP Time Calculation (min) (ACUTE ONLY): 25 min ? ?Past Medical History:  ?Past Medical History:  ?Diagnosis Date  ? Depressive disorder   ? Hyperlipemia   ? Hypertension   ? Mild mental retardation   ? Osteoporosis   ? ?Past Surgical History:  ?Past Surgical History:  ?Procedure Laterality Date  ? ABDOMINAL HYSTERECTOMY    ? ?HPI:  ?Amy Solis is a 71 y.o. female with PMH: HTN, HLD, ID, vascular dementia, depression who was recently admitted from 4/14-4/10 with AMS thought to be related to recurrent UTI. She is a resident at [facility] facility. She presented to ER on 4/12 presenting with AMS. CXR shows Multilobar left-sided bronchopneumonia. SLP swallow evaluation ordered due to questionable aspiration.  ?  ?Assessment / Plan / Recommendation  ?Clinical Impression ? Patient presents with clinical s/s of dysphagia as per this bedside/clinical swallow evaluation. Patient was quite somnolent initially but after SLP helped reposition her to middle of bed and raised HOB, patient started to become alert and although initially declining PO's, she did eventually agree to drink some cranberry juice. She was able to suck through straw but did not have enough strength to consistently suck liquid into mouth. Swallow initiation appeared timely but SLP questions some potential weakness in her pharyngeal contraction and did have an audible squeeze when swallowing. SLP is recommending Dys 1 (puree) solids and thin liquids. RN informed of recommendations. SLP will follow patient for toleration, ability to upgrade and to determine need for objective swallow evaluation. ?SLP Visit Diagnosis: Dysphagia, unspecified (R13.10) ?   ?Aspiration Risk ? Mild aspiration risk  ?  ?Diet Recommendation Dysphagia 1 (Puree);Thin  liquid  ? ?Liquid Administration via: Cup;Straw ?Medication Administration: Whole meds with liquid ?Supervision: Full supervision/cueing for compensatory strategies;Staff to assist with self feeding ?Compensations: Minimize environmental distractions;Slow rate;Small sips/bites ?Postural Changes: Seated upright at 90 degrees  ?  ?Other  Recommendations Oral Care Recommendations: Oral care BID   ? ?Recommendations for follow up therapy are one component of a multi-disciplinary discharge planning process, led by the attending physician.  Recommendations may be updated based on patient status, additional functional criteria and insurance authorization. ? ?Follow up Recommendations Skilled nursing-short term rehab (<3 hours/day)  ? ? ?  ?Assistance Recommended at Discharge Frequent or constant Supervision/Assistance  ?Functional Status Assessment Patient has had a recent decline in their functional status and demonstrates the ability to make significant improvements in function in a reasonable and predictable amount of time.  ?Frequency and Duration min 2x/week  ?1 week ?  ?   ? ?Prognosis Prognosis for Safe Diet Advancement: Good ?Barriers to Reach Goals: Cognitive deficits  ? ?  ? ?Swallow Study   ?General Date of Onset: 10/26/21 ?HPI: Amy Solis is a 71 y.o. female with PMH: HTN, HLD, ID, vascular dementia, depression who was recently admitted from 4/14-4/10 with AMS thought to be related to recurrent UTI. She is a resident at [facility] facility. She presented to ER on 4/12 presenting with AMS. CXR shows Multilobar left-sided bronchopneumonia. SLP swallow evaluation ordered due to questionable aspiration. ?Type of Study: Bedside Swallow Evaluation ?Previous Swallow Assessment: none found ?Diet Prior to this Study: Regular;Thin liquids ?Temperature Spikes Noted: No ?Respiratory Status: Nasal cannula ?History of Recent Intubation: No ?Behavior/Cognition: Alert;Cooperative;Pleasant mood;Lethargic/Drowsy ?Oral Cavity  Assessment: Within Functional Limits ?Oral  Care Completed by SLP: Yes ?Oral Cavity - Dentition: Adequate natural dentition ?Self-Feeding Abilities: Total assist ?Patient Positioning: Upright in bed ?Baseline Vocal Quality: Hoarse;Low vocal intensity ?Volitional Cough: Cognitively unable to elicit ?Volitional Swallow: Unable to elicit  ?  ?Oral/Motor/Sensory Function Overall Oral Motor/Sensory Function: Other (comment) (did not perform full oral motor movements but appears WFL)   ?Ice Chips     ?Thin Liquid Thin Liquid: Impaired ?Presentation: Straw ?Oral Phase Functional Implications: Other (comment) ?Pharyngeal  Phase Impairments: Other (comments) ?Other Comments: Patient with reduced strength in sucking through straw, no overt s/s aspiration or penetration, questionable reduced pharyngeal strength but swallow initiation appeared timely  ?  ?Nectar Thick     ?Honey Thick     ?Puree Puree: Not tested ?Other Comments: patient declined   ?Solid ? ? ?  Solid: Not tested ?Other Comments: patient declined  ? ?  ? ?Amy Nevin, MA, CCC-SLP ?Speech Therapy ? ? ? ? ? ?

## 2021-10-26 NOTE — ED Notes (Signed)
Pt responds to voice. Pt states she is moves around in a wheel chair and usually doesn't feel this tired.  ?

## 2021-10-27 DIAGNOSIS — J189 Pneumonia, unspecified organism: Secondary | ICD-10-CM | POA: Diagnosis not present

## 2021-10-27 LAB — BASIC METABOLIC PANEL
Anion gap: 6 (ref 5–15)
BUN: 9 mg/dL (ref 8–23)
CO2: 29 mmol/L (ref 22–32)
Calcium: 8.6 mg/dL — ABNORMAL LOW (ref 8.9–10.3)
Chloride: 108 mmol/L (ref 98–111)
Creatinine, Ser: 0.6 mg/dL (ref 0.44–1.00)
GFR, Estimated: >60 mL/min (ref >60)
Glucose, Bld: 93 mg/dL (ref 70–99)
Potassium: 3.7 mmol/L (ref 3.5–5.1)
Sodium: 143 mmol/L (ref 135–145)

## 2021-10-27 NOTE — Evaluation (Signed)
Physical Therapy Evaluation ?Patient Details ?Name: Amy Solis ?MRN: [id number] ?DOB: 1950-12-16 ?Today's Date: 10/27/2021 ? ?History of Present Illness ? Pt is a 71 year old woman admitted on 10/26/21 with AMS and somnolence, + PNA. PMH: vascular dementia, intellectual disability, HTN. HLD, hyperthyroidism, depression, recurrent UTIs, osteoporosis.  ?Clinical Impression ? Pt was seen for progression to sit on side of bed after being transferred from ALF for her PNA and UTI symptoms.  Pt is generally weak but her condition of LE joints implies she is not moving more than bed to chair and propelling in wc.  Pt is not able to give a full history but will assume cognitively she is near baseline and recommend HHPT to follow along for transfer training to chair.  Pt is a bit fearful and will also recommend two person help to transfer initially with help to get to a chair if she allows.  Follow as outlined below for acute PT goals of therapy.   ?   ? ?Recommendations for follow up therapy are one component of a multi-disciplinary discharge planning process, led by the attending physician.  Recommendations may be updated based on patient status, additional functional criteria and insurance authorization. ? ?Follow Up Recommendations Home health PT ? ?  ?Assistance Recommended at Discharge Frequent or constant Supervision/Assistance  ?Patient can return home with the following ? Two people to help with walking and/or transfers;A lot of help with bathing/dressing/bathroom;Direct supervision/assist for medications management;Direct supervision/assist for financial management;Assist for transportation;Help with stairs or ramp for entrance ? ?  ?Equipment Recommendations None recommended by PT  ?Recommendations for Other Services ?    ?  ?Functional Status Assessment Patient has had a recent decline in their functional status and demonstrates the ability to make significant improvements in function in a reasonable and  predictable amount of time.  ? ?  ?Precautions / Restrictions Precautions ?Precautions: Fall ?Precaution Comments: monitor if up in chair ?Restrictions ?Weight Bearing Restrictions: No  ? ?  ? ?Mobility ? Bed Mobility ?Overal bed mobility: Needs Assistance ?Bed Mobility: Supine to Sit, Sit to Supine ?  ?  ?Supine to sit: Mod assist ?Sit to supine: Min guard ?  ?General bed mobility comments: Pt was motivated to get back to bed so basically got herself to move ?  ? ?Transfers ?  ?  ?  ?  ?  ?  ?  ?  ?  ?General transfer comment: pt refused to let PT try to stand her ?  ? ?Ambulation/Gait ?  ?  ?  ?  ?  ?  ?  ?General Gait Details: not functionally able to walk due to contractures at knees ? ?Stairs ?  ?  ?  ?  ?  ? ?Wheelchair Mobility ?  ? ?Modified Rankin (Stroke Patients Only) ?  ? ?  ? ?Balance Overall balance assessment: Needs assistance ?Sitting-balance support: Feet supported ?Sitting balance-Leahy Scale: Fair ?  ?  ?  ?  ?  ?  ?  ?  ?  ?  ?  ?  ?  ?  ?  ?  ?   ? ? ? ?Pertinent Vitals/Pain Pain Assessment ?Pain Assessment: Faces ?Faces Pain Scale: No hurt  ? ? ?Home Living Family/patient expects to be discharged to:: Assisted living [facility]) ?  ?  ?  ?  ?  ?  ?  ?  ?  ?Additional Comments: pt is unable to give an accounting of her equipment  ?  ?Prior  Function Prior Level of Function : Needs assist ?  ?  ?  ?  ?  ?  ?Mobility Comments: B knee flexion contractures ?ADLs Comments: Pt demonstrated ability to self feed with min assist to initiate, she is otherwise dependent. ?  ? ? ?Hand Dominance  ? Dominant Hand: Right ? ?  ?Extremity/Trunk Assessment  ? Upper Extremity Assessment ?Upper Extremity Assessment: Defer to OT evaluation ?  ? ?Lower Extremity Assessment ?Lower Extremity Assessment: Generalized weakness ?  ? ?Cervical / Trunk Assessment ?Cervical / Trunk Assessment: Kyphotic  ?Communication  ? Communication: Expressive difficulties (unclear speech)  ?Cognition Arousal/Alertness:  Lethargic ?Behavior During Therapy: Flat affect, WFL for tasks assessed/performed ?Overall Cognitive Status: No family/caregiver present to determine baseline cognitive functioning ?  ?  ?  ?  ?  ?  ?  ?  ?  ?  ?  ?  ?  ?  ?  ?  ?General Comments: cognitively may be baseline, pt not sure about what her PLOF was except to say she used wheelchair to get around ?  ?  ? ?  ?General Comments General comments (skin integrity, edema, etc.): pt motivation to move is impacting her independence with bed mob and transfers.  Follow along with her to encourage up in chair and to move LE's to increase AROM ? ?  ?Exercises    ? ?Assessment/Plan  ?  ?PT Assessment Patient needs continued PT services  ?PT Problem List Decreased strength;Decreased range of motion;Decreased activity tolerance;Decreased balance;Decreased mobility;Decreased coordination;Decreased knowledge of use of DME;Decreased safety awareness;Decreased cognition ? ?   ?  ?PT Treatment Interventions DME instruction;Functional mobility training;Therapeutic activities;Therapeutic exercise;Balance training;Neuromuscular re-education;Patient/family education   ? ?PT Goals (Current goals can be found in the Care Plan section)  ?Acute Rehab PT Goals ?Patient Stated Goal: none stated ?PT Goal Formulation: Patient unable to participate in goal setting ?Time For Goal Achievement: 11/03/21 ?Potential to Achieve Goals: Fair ? ?  ?Frequency Min 3X/week ?  ? ? ?Co-evaluation   ?  ?  ?  ?  ? ? ?  ?AM-PAC PT "6 Clicks" Mobility  ?Outcome Measure Help needed turning from your back to your side while in a flat bed without using bedrails?: A Lot ?Help needed moving from lying on your back to sitting on the side of a flat bed without using bedrails?: A Lot ?Help needed moving to and from a bed to a chair (including a wheelchair)?: Total ?Help needed standing up from a chair using your arms (e.g., wheelchair or bedside chair)?: Total ?Help needed to walk in hospital room?: Total ?Help  needed climbing 3-5 steps with a railing? : Total ?6 Click Score: 8 ? ?  ?End of Session   ?Activity Tolerance: Treatment limited secondary to agitation ?Patient left: in bed;with call bell/phone within reach;with bed alarm set ?Nurse Communication: Mobility status ?PT Visit Diagnosis: Unsteadiness on feet (R26.81);Muscle weakness (generalized) (M62.81) ?  ? ?Time: 8563-1497 ?PT Time Calculation (min) (ACUTE ONLY): 11 min ? ? ?Charges:   PT Evaluation ?$PT Eval Moderate Complexity: 1 Mod ?  ?  ?   ? ?Ivar Drape ?10/27/2021, 12:55 PM ? ?Samul Dada, PT PhD ?Acute Rehab Dept. Number: [facility] [phone] and [facility] [phone] ? ? ?

## 2021-10-27 NOTE — Progress Notes (Signed)
?PROGRESS NOTE ? ?Amy Solis  ZOX:[id number] DOB: 1951/01/16 DOA: 10/26/2021 ?PCP: Florentina Jenny, MD  ? ?Brief Narrative: ?Patient is a 71 year old female with history of hypertension, hyperlipidemia, intellectual disability, vascular dementia, depression who presented from ALF with altered mental status.  She was recently admitted here and was treated for UTI and was discharged to ALF.  On presentation she was found to be somnolent.  Chest x-ray showed left multilobar pneumonia, parapneumonic effusion.  Started on antibiotics.  PT/OT consulted with plan for skilled nursing facility discharge. ? ?Assessment & Plan: ? ?Principal Problem: ?  Multifocal pneumonia ?Active Problems: ?  Hyperthyroidism ?  Vascular dementia (HCC) ?  Chronic respiratory failure with hypoxia (HCC) ?  Essential hypertension ?  DNR (do not resuscitate) ? ?Multifocal pneumonia: Presented with altered mental status, hypoxia, chest x-ray showed diffuse infiltrates in the left lung.  Currently requiring 2 to 3 L.  Aspiration pneumonia is a possibility.  Speech therapy consulted, currently on dysphagia 1 diet.Marland Kitchen  COVID, influenza: Negative.  Started on ceftriaxone, azithromycin.  Started on gentle IV fluids.  Monitor respiratory status ? ?Acute metabolic encephalopathy: Could be complicated by pneumonia but currently her mental status is close to baseline. ? ?Vascular dementia/anxiety/depression: On risperidone, gabapentin, Depakote, paroxetine, Ativan at ALF. ?Continue supportive care, delirium precautions ?Since she is somnolent, we held risperidone, gabapentin and Ativan for now ? ?Chronic hypoxic respiratory failure: Chronically on 2 L of oxygen per minute. ? ?Hypertension: On amlodipine, benazepril.  Monitor blood pressure,stable ? ?Hypothyroidism: Continue methimazole ? ?CODE STATUS: Currently she is DNR ? ?Debility/deconditioning: Lives in ALF.  Has been weak, deconditioned.  May benefit with skilled nursing facility.  PT/OT consulted,lets  see what they recommend, TOC follows ? ?  ? ? ?  ?  ? ?DVT prophylaxis:enoxaparin (LOVENOX) injection 40 mg Start: 10/26/21 1030 ? ? ?  Code Status: DNR ? ?Family Communication: Called POA,call not received ? ?Patient status:Inpatient ? ?Patient is from :Home ? ?Anticipated discharge to:ALF vs SNF ? ?Estimated DC date:1-2 days ? ? ?Consultants: None ? ?Procedures:None ? ?Antimicrobials:  ?Anti-infectives (From admission, onward)  ? ? Start     Dose/Rate Route Frequency Ordered Stop  ? 10/27/21 0930  azithromycin (ZITHROMAX) 500 mg in sodium chloride 0.9 % 250 mL IVPB       ? 500 mg ?250 mL/hr over 60 Minutes Intravenous Every 24 hours 10/26/21 1013 10/31/21 0929  ? 10/27/21 0900  cefTRIAXone (ROCEPHIN) 2 g in sodium chloride 0.9 % 100 mL IVPB       ? 2 g ?200 mL/hr over 30 Minutes Intravenous Every 24 hours 10/26/21 1013 10/31/21 0859  ? 10/26/21 0745  cefTRIAXone (ROCEPHIN) 1 g in sodium chloride 0.9 % 100 mL IVPB       ? 1 g ?200 mL/hr over 30 Minutes Intravenous  Once 10/26/21 0741 10/26/21 0924  ? 10/26/21 0745  azithromycin (ZITHROMAX) 500 mg in sodium chloride 0.9 % 250 mL IVPB       ? 500 mg ?250 mL/hr over 60 Minutes Intravenous  Once 10/26/21 0741 10/26/21 1313  ? ?  ? ? ?Subjective: ?Patient seen and examined the bedside this morning.  Hemodynamically stable.  Lying in bed.  On 2 L of oxygen per minute.  Not in any kind of respiratory distress.  Looks very slow, sleepy but was able to communicate.  She knows she is in the hospital.  Not aware about the time.  Denies any complaints ? ?Objective: ?Vitals:  ? 10/26/21 2016  10/26/21 2110 10/26/21 2349 10/27/21 0459  ?BP:  (!) 124/48 (!) 142/70 129/65  ?Pulse:  75 71 65  ?Resp:  17 17 15   ?Temp: 98.2 ?F (36.8 ?C) 97.9 ?F (36.6 ?C) 98.3 ?F (36.8 ?C)   ?TempSrc: Oral Oral Oral   ?SpO2:  97% 94% 97%  ? ? ?Intake/Output Summary (Last 24 hours) at 10/27/2021 0759 ?Last data filed at 10/27/2021 0300 ?Gross per 24 hour  ?Intake 1843.16 ml  ?Output --  ?Net 1843.16 ml   ? ?There were no vitals filed for this visit. ? ?Examination: ? ?General exam: Very deconditioned, chronically ill looking, weak, not in distress ?HEENT: PERRL ?Respiratory system: Diminished air entry bilaterally, no frank wheezes or crackles ? cardiovascular system: S1 & S2 heard, RRR.  ?Gastrointestinal system: Abdomen is nondistended, soft and nontender. ?Central nervous system: Alert and awake, oriented to place only ?Extremities: No edema, no clubbing ,no cyanosis ?Skin: No rashes, no ulcers,no icterus   ? ? ?Data Reviewed: I have personally reviewed following labs and imaging studies ? ?CBC: ?Recent Labs  ?Lab 10/20/21 ?1305 10/20/21 ?1340 10/22/21 ?12/22/21 10/23/21 ?0353 10/26/21 ?0725 10/26/21 ?0747  ?WBC 6.7  --  4.4 4.4 5.9  --   ?NEUTROABS 4.3  --   --   --  3.5  --   ?HGB 12.6 12.6 11.7* 11.6* 12.5 12.6  ?HCT 42.1 37.0 34.9* 36.6 39.3 37.0  ?MCV 103.7*  --  94.3 96.8 98.7  --   ?PLT 248  --  232 260 286  --   ? ?Basic Metabolic Panel: ?Recent Labs  ?Lab 10/20/21 ?1305 10/20/21 ?1340 10/22/21 ?12/22/21 10/23/21 ?0353 10/26/21 ?0725 10/26/21 ?0747  ?NA 144 143 141 142 143 142  ?K 4.2 4.2 3.3* 3.2* 4.4 4.4  ?CL 111  --  105 107 109  --   ?CO2 27  --  29 30 29   --   ?GLUCOSE 84  --  84 100* 95  --   ?BUN 26*  --  7* 8 14  --   ?CREATININE 1.06*  --  0.63 0.82 1.26*  --   ?CALCIUM 8.7*  --  8.4* 8.3* 8.7*  --   ?MG  --   --   --  1.7  --   --   ? ? ? ?Recent Results (from the past 240 hour(s))  ?Urine Culture     Status: Abnormal  ? Collection Time: 10/20/21 12:54 PM  ? Specimen: In/Out Cath Urine  ?Result Value Ref Range Status  ? Specimen Description IN/OUT CATH URINE  Final  ? Special Requests   Final  ?  NONE ?Performed at [facility], [address] [place], [address] [place] ?  ? Culture MULTIPLE SPECIES PRESENT, SUGGEST RECOLLECTION (A)  Final  ? Report Status 10/21/2021 FINAL  Final  ?Culture, blood (routine x 2)     Status: None  ? Collection Time: 10/20/21  1:05 PM  ? Specimen: BLOOD RIGHT WRIST   ?Result Value Ref Range Status  ? Specimen Description BLOOD RIGHT WRIST  Final  ? Special Requests   Final  ?  BOTTLES DRAWN AEROBIC AND ANAEROBIC Blood Culture results may not be optimal due to an inadequate volume of blood received in culture bottles  ? Culture   Final  ?  NO GROWTH 5 DAYS ?Performed at [facility], [address], [place], [address] [place] ?  ? Report Status 10/25/2021 FINAL  Final  ?Culture, blood (routine x 2)  Status: None  ? Collection Time: 10/20/21  5:00 PM  ? Specimen: BLOOD  ?Result Value Ref Range Status  ? Specimen Description BLOOD BLOOD LEFT ARM  Final  ? Special Requests   Final  ?  BOTTLES DRAWN AEROBIC AND ANAEROBIC Blood Culture adequate volume  ? Culture   Final  ?  NO GROWTH 5 DAYS ?Performed at [facility], [address] [place], [place] [address] ?  ? Report Status 10/25/2021 FINAL  Final  ?Resp Panel by RT-PCR (Flu A&B, Covid) Nasopharyngeal Swab     Status: None  ? Collection Time: 10/26/21  7:25 AM  ? Specimen: Nasopharyngeal Swab; Nasopharyngeal(NP) swabs in vial transport medium  ?Result Value Ref Range Status  ? SARS Coronavirus 2 by RT PCR NEGATIVE NEGATIVE Final  ?  Comment: (NOTE) ?SARS-CoV-2 target nucleic acids are NOT DETECTED. ? ?The SARS-CoV-2 RNA is generally detectable in upper respiratory ?specimens during the acute phase of infection. The lowest ?concentration of SARS-CoV-2 viral copies this assay can detect is ?138 copies/mL. A negative result does not preclude SARS-Cov-2 ?infection and should not be used as the sole basis for treatment or ?other patient management decisions. A negative result may occur with  ?improper specimen collection/handling, submission of specimen other ?than nasopharyngeal swab, presence of viral mutation(s) within the ?areas targeted by this assay, and inadequate number of viral ?copies(<138 copies/mL). A negative result must be combined with ?clinical observations, patient history, and  epidemiological ?information. The expected result is Negative. ? ?Fact Sheet for Patients:  ?BloggerCourse.comhttps://www.fda.gov/media/152166/download ? ?Fact Sheet for Healthcare Providers:  ?SeriousBroker.ithttps://www.fda.gov/media/152162/download ? ?This

## 2021-10-27 NOTE — Progress Notes (Signed)
Speech Language Pathology Treatment: Dysphagia  ?Patient Details ?Name: Amy Solis ?MRN: [id number] ?DOB: 1951-05-04 ?Today's Date: 10/27/2021 ?Time: 2202-5427 ?SLP Time Calculation (min) (ACUTE ONLY): 20 min ? ?Assessment / Plan / Recommendation ?Clinical Impression ? Patient seen by SLP for skilled treatment session focused on dysphagia goals. Patient was sleeping when SLP entered room but awoke easily and able to maintain adequate alertness and although she is still a bit drowsy, she is much better than yesterday. OT in room assessing her and got patient sitting on EOB. With tactile and some initial hand over hand cues/assist she was able to hold cup and drink liquids via straw sips and able to feed self diced peaches using spoon. Delayed initiation of oral phase and prolonged mastication but only trace PO residuals of peaches after initial swallows. Patient had one congested sounding cough that did not appear related to PO intake. SLP is recommending continue Dys 1 solids, thin liquids but when she is able to be more consistently awake and alert, she should be able to upgrade to at least Dys 2 solids. SLP will continue to follow for PO toleration and readiness to upgrade solids. ?  ?HPI HPI: Daralene Solis is a 71 y.o. female with PMH: HTN, HLD, ID, vascular dementia, depression who was recently admitted from 4/14-4/10 with AMS thought to be related to recurrent UTI. She is a resident at [facility] facility. She presented to ER on 4/12 presenting with AMS. CXR shows Multilobar left-sided bronchopneumonia. SLP swallow evaluation ordered due to questionable aspiration. ?  ?   ?SLP Plan ? Continue with current plan of care ? ?  ?  ?Recommendations for follow up therapy are one component of a multi-disciplinary discharge planning process, led by the attending physician.  Recommendations may be updated based on patient status, additional functional criteria and insurance authorization. ?  ? ?Recommendations  ?Diet  recommendations: Dysphagia 1 (puree);Thin liquid ?Liquids provided via: Cup;Straw ?Medication Administration: Whole meds with liquid ?Supervision: Full supervision/cueing for compensatory strategies;Staff to assist with self feeding;Patient able to self feed ?Compensations: Minimize environmental distractions;Slow rate;Small sips/bites ?Postural Changes and/or Swallow Maneuvers: Seated upright 90 degrees  ?   ?    ?   ? ? ? ? Oral Care Recommendations: Oral care BID ?Follow Up Recommendations: Skilled nursing-short term rehab (<3 hours/day) ?Assistance recommended at discharge: Frequent or constant Supervision/Assistance ?SLP Visit Diagnosis: Dysphagia, unspecified (R13.10) ?Plan: Continue with current plan of care ? ? ? ? ?  ?  ? ?Angela Nevin, MA, CCC-SLP ?Speech Therapy ? ?

## 2021-10-27 NOTE — Evaluation (Signed)
Occupational Therapy Evaluation ?Patient Details ?Name: Amy Solis ?MRN: [id number] ?DOB: April 05, 1951 ?Today's Date: 10/27/2021 ? ? ?History of Present Illness Pt is a 71 year old woman admitted on 10/26/21 with AMS and somnolence, + PNA. PMH: vascular dementia, intellectual disability, HTN. HLD, hyperthyroidism, depression, recurrent UTIs, osteoporosis.  ? ?Clinical Impression ?  ?Pt somnolent, but opens eyes when stimulated and somewhat more alert when seated EOB. Pt requires mod to total assist for bed level mobility and progressed from moderate to min guard assist for sitting balance. She self fed with a spoon and drank from a cup with a straw with min assist to initiate. Pt with knee contractures, does not appear to be an ambulator. Attempts to call [facility] when unanswered. Pt is oriented to her name and follows simple commands. Will follow acutely. Recommend return to [facility] upon discharge. ?   ? ?Recommendations for follow up therapy are one component of a multi-disciplinary discharge planning process, led by the attending physician.  Recommendations may be updated based on patient status, additional functional criteria and insurance authorization.  ? ?Follow Up Recommendations ? No OT follow up  ?  ?Assistance Recommended at Discharge Frequent or constant Supervision/Assistance  ?Patient can return home with the following Two people to help with walking and/or transfers;A lot of help with bathing/dressing/bathroom;Assistance with feeding;Assistance with cooking/housework;Direct supervision/assist for medications management;Direct supervision/assist for financial management;Assist for transportation;Help with stairs or ramp for entrance ? ?  ?Functional Status Assessment ? Patient has had a recent decline in their functional status and/or demonstrates limited ability to make significant improvements in function in a reasonable and predictable amount of time  ?Equipment  Recommendations ? None recommended by OT  ?  ?Recommendations for Other Services   ? ? ?  ?Precautions / Restrictions Precautions ?Precautions: Fall  ? ?  ? ?Mobility Bed Mobility ?Overal bed mobility: Needs Assistance ?Bed Mobility: Supine to Sit, Sit to Supine ?  ?  ?Supine to sit: Total assist, HOB elevated ?Sit to supine: Mod assist ?  ?  ?  ? ?Transfers ?  ?  ?  ?  ?  ?  ?  ?  ?  ?General transfer comment: deferred, will require lift equipment ?  ? ?  ?Balance Overall balance assessment: Needs assistance ?  ?Sitting balance-Leahy Scale: Fair ?Sitting balance - Comments: fair to poor ?  ?  ?  ?  ?  ?  ?  ?  ?  ?  ?  ?  ?  ?  ?  ?   ? ?ADL either performed or assessed with clinical judgement  ? ?ADL Overall ADL's : At baseline ?  ?  ?  ?  ?  ?  ?  ?  ?  ?  ?  ?  ?  ?  ?  ?  ?  ?  ?  ?General ADL Comments: Pt sat EOB and self fed with spoon and cup with straw with min assist to initiate.  ? ? ? ?Vision Ability to See in Adequate Light: 0 Adequate ?   ?   ?Perception   ?  ?Praxis   ?  ? ?Pertinent Vitals/Pain Pain Assessment ?Pain Assessment: Faces ?Faces Pain Scale: No hurt  ? ? ? ?Hand Dominance Right ?  ?Extremity/Trunk Assessment Upper Extremity Assessment ?Upper Extremity Assessment: Generalized weakness ?  ?Lower Extremity Assessment ?Lower Extremity Assessment: Defer to PT evaluation ?  ?Cervical / Trunk Assessment ?Cervical / Trunk Assessment: Kyphotic;Other  exceptions (with forward head, weakness) ?  ?Communication Communication ?Communication: Expressive difficulties;Other (comment) (minimally verbal) ?  ?Cognition Arousal/Alertness: Lethargic ?Behavior During Therapy: [facility] for tasks assessed/performed (smiles) ?Overall Cognitive Status: No family/caregiver present to determine baseline cognitive functioning ?  ?  ?  ?  ?  ?  ?  ?  ?  ?  ?  ?  ?  ?  ?  ?  ?General Comments: pt oriented to self only, thinks she is at [facility], follows simple commands ?  ?  ?General Comments    ? ?  ?Exercises   ?   ?Shoulder Instructions    ? ? ?Home Living Family/patient expects to be discharged to:: Assisted living [facility]) ?  ?  ?  ?  ?  ?  ?  ?  ?  ?  ?  ?  ?  ?  ?  ?  ?  ?  ? ?  ?Prior Functioning/Environment Prior Level of Function : Needs assist ?  ?  ?  ?  ?  ?  ?Mobility Comments: Pt does not appear to be an ambulator, B knee contractures. ?ADLs Comments: Pt demonstrated ability to self feed with min assist to initiate, she is otherwise dependent. ?  ? ?  ?  ?OT Problem List:   ?  ?   ?OT Treatment/Interventions:    ?  ?OT Goals(Current goals can be found in the care plan section) Acute Rehab OT Goals ?OT Goal Formulation: Patient unable to participate in goal setting ?Time For Goal Achievement: 11/10/21 ?Potential to Achieve Goals: Fair ?ADL Goals ?Pt Will Perform Eating: with supervision;sitting;bed level ?Pt Will Perform Grooming: with min assist;sitting;bed level ?Additional ADL Goal #1: Pt will demonstrate fair sitting balance at EOB x 10 minutes.  ?OT Frequency:   ?  ? ?Co-evaluation   ?  ?  ?  ?  ? ?  ?AM-PAC OT "6 Clicks" Daily Activity     ?Outcome Measure Help from another person eating meals?: A Little ?Help from another person taking care of personal grooming?: Total ?Help from another person toileting, which includes using toliet, bedpan, or urinal?: Total ?Help from another person bathing (including washing, rinsing, drying)?: Total ?Help from another person to put on and taking off regular upper body clothing?: Total ?Help from another person to put on and taking off regular lower body clothing?: Total ?6 Click Score: 8 ?  ?End of Session Equipment Utilized During Treatment: Oxygen (3L) ? ?Activity Tolerance: Patient tolerated treatment well ?Patient left: in bed;with call bell/phone within reach;with bed alarm set ? ?OT Visit Diagnosis: Other symptoms and signs involving cognitive function;Muscle weakness (generalized) (M62.81)  ?              ?Time: 4098-1191 ?OT Time Calculation (min): 28  min ?Charges:  OT General Charges ?$OT Visit: 1 Visit ?OT Evaluation ?$OT Eval Moderate Complexity: 1 Mod ?OT Treatments ?$Self Care/Home Management : 8-22 mins ? ?Martie Round, OTR/L ?Acute Rehabilitation Services ?Pager: [phone] ?Office: [phone]  ? ?Evern Bio ?10/27/2021, 10:01 AM ?

## 2021-10-27 NOTE — TOC Initial Note (Signed)
Transition of Care (TOC) - Initial/Assessment Note  ? ? ?Patient Details  ?Name: Amy Solis ?MRN: [id number] ?Date of Birth: 1950/12/26 ? ?Transition of Care (TOC) CM/SW Contact:    ?Lockie Pares, RN ?Phone Number: ?10/27/2021, 12:50 PM ? ?Clinical Narrative:                 ? ?Patient from [facility] ALF. PT  recommended HH PT. [facility], spoke to Nursing there. They utilize [facility] for therapy. Called and left [place] from [place] a message to return call. Updated nursing at [facility] about expected DC.Messaged provider concerning orders. ?  ?  ? ? ?Patient Goals and CMS Choice ?  ?  ?  ? ?Expected Discharge Plan and Services ?  ? ALF with HH PT ?  ?Post Acute Care Choice: Nursing Home ?Living arrangements for the past 2 months: Assisted Living Facility ?                ?  ?  ?  ?  ?  ?HH Arranged: PT ?HH Agency:  International aid/development worker) ?Date HH Agency Contacted: 10/27/21 ?Time HH Agency Contacted: 1250 ?Representative spoke with at [facility] Agency: Rene Kocher  Left message ? ?Prior Living Arrangements/Services ?Living arrangements for the past 2 months: Assisted Living Facility ?Lives with:: Facility Resident ?Patient language and need for interpreter reviewed:: Yes ?       ?Need for Family Participation in Patient Care: Yes (Comment) ?Care giver support system in place?: Yes (comment) ?  ?Criminal Activity/Legal Involvement Pertinent to Current Situation/Hospitalization: No - Comment as needed ? ?Activities of Daily Living ?  ?  ? ?Permission Sought/Granted ?  ?  ?   ?   ?   ?   ? ?Emotional Assessment ?  ?Attitude/Demeanor/Rapport: Unable to Assess ?  ?Orientation: : Fluctuating Orientation (Suspected and/or reported Sundowners) ?Alcohol / Substance Use: Not Applicable ?Psych Involvement: No (comment) ? ?Admission diagnosis:  Acute metabolic encephalopathy [G93.41] ?Multifocal pneumonia [J18.9] ?Patient Active Problem List  ? Diagnosis Date Noted  ? Multifocal pneumonia 10/26/2021  ? DNR (do not resuscitate)  10/26/2021  ? Hyperthyroidism 10/22/2021  ? Vascular dementia (HCC) 10/22/2021  ? COPD (chronic obstructive pulmonary disease) (HCC) 10/22/2021  ? Chronic respiratory failure with hypoxia (HCC) 10/22/2021  ? Essential hypertension 10/22/2021  ? Hypokalemia 10/22/2021  ? Toxic encephalopathy 10/21/2021  ? Acute encephalopathy 10/20/2021  ? AMS (altered mental status) 10/20/2021  ? Benzodiazepine causing adverse effect in therapeutic use 02/15/2019  ? 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 Diastolic dysfunction 06/06/2016  ? Nocturnal hypoxemia 05/09/2016  ? Coronary artery calcification 05/09/2016  ? Family history of early CAD 05/09/2016  ? Dyspnea 03/09/2016  ? ?PCP:  Florentina Jenny, MD ?Pharmacy:   ?[facility] [place], [place] - [address] [facility]. ?[address] [facility] [place] [address] ?Phone: [phone] Fax: [phone] ? ? ? ? ?Social Determinants of Health (SDOH) Interventions ?  ? ?Readmission Risk Interventions ?   ? View : No data to display.  ?  ?  ?  ? ? ? ?

## 2021-10-28 ENCOUNTER — Encounter: Payer: Self-pay | Admitting: Internal Medicine

## 2021-10-28 DIAGNOSIS — J189 Pneumonia, unspecified organism: Secondary | ICD-10-CM | POA: Diagnosis not present

## 2021-10-28 LAB — CBC WITH DIFFERENTIAL/PLATELET
Abs Immature Granulocytes: 0.05 10*3/uL (ref 0.00–0.07)
Basophils Absolute: 0.1 10*3/uL (ref 0.0–0.1)
Basophils Relative: 1 %
Eosinophils Absolute: 0.3 10*3/uL (ref 0.0–0.5)
Eosinophils Relative: 4 %
HCT: 39.7 % (ref 36.0–46.0)
Hemoglobin: 12.6 g/dL (ref 12.0–15.0)
Immature Granulocytes: 1 %
Lymphocytes Relative: 17 %
Lymphs Abs: 1.3 10*3/uL (ref 0.7–4.0)
MCH: 30.6 pg (ref 26.0–34.0)
MCHC: 31.7 g/dL (ref 30.0–36.0)
MCV: 96.4 fL (ref 80.0–100.0)
Monocytes Absolute: 1 10*3/uL (ref 0.1–1.0)
Monocytes Relative: 13 %
Neutro Abs: 5 10*3/uL (ref 1.7–7.7)
Neutrophils Relative %: 64 %
Platelets: 269 10*3/uL (ref 150–400)
RBC: 4.12 MIL/uL (ref 3.87–5.11)
RDW: 13.9 % (ref 11.5–15.5)
WBC: 7.7 10*3/uL (ref 4.0–10.5)
nRBC: 0 % (ref 0.0–0.2)

## 2021-10-28 MED ORDER — CEFDINIR 300 MG PO CAPS: 300.0000 mg | ORAL_CAPSULE | Freq: Two times a day (BID) | ORAL | 0 refills | Status: AC

## 2021-10-28 MED ORDER — AZITHROMYCIN 500 MG PO TABS: 500.0000 mg | ORAL_TABLET | Freq: Every day | ORAL | 0 refills | Status: AC

## 2021-10-28 NOTE — Plan of Care (Signed)
Pt going back to [facility] ?

## 2021-10-28 NOTE — Progress Notes (Signed)
Speech Language Pathology Treatment: Dysphagia  ?Patient Details ?Name: Amy Solis ?MRN: [id number] ?DOB: February 25, 1951 ?Today's Date: 10/28/2021 ?Time: 4327-6147 ?SLP Time Calculation (min) (ACUTE ONLY): 13 min ? ?Assessment / Plan / Recommendation ?Clinical Impression ? Compared to evaluation documentation, pt has made significant improvements in alertness and responsiveness. Smiling and receptive to all po trials this morning including pudding, water and solid. Mastication and transit with graham cracker was typical and effective without residue. She did have one immediate cough with subsequent straw sip water after solid trial possibly due to particulate from cracker. No further coughing noted after cracker or thin liquid. SLP upgraded to regular texture, continue thin, recommend pills whole with puree and pt set to transfer to ALF today per MD. No further ST needed.   ?HPI HPI: Amy Solis is a 71 y.o. female with PMH: HTN, HLD, ID, vascular dementia, depression who was recently admitted from 4/14-4/10 with AMS thought to be related to recurrent UTI. She is a resident at [facility] facility. She presented to ER on 4/12 presenting with AMS. CXR shows Multilobar left-sided bronchopneumonia. SLP swallow evaluation ordered due to questionable aspiration. ?  ?   ?SLP Plan ? All goals met;Discharge SLP treatment due to (comment) (transferring back to ALF today) ? ?  ?  ?Recommendations for follow up therapy are one component of a multi-disciplinary discharge planning process, led by the attending physician.  Recommendations may be updated based on patient status, additional functional criteria and insurance authorization. ?  ? ?Recommendations  ?Diet recommendations: Regular;Thin liquid ?Liquids provided via: Cup;Straw ?Medication Administration: Whole meds with liquid ?Supervision: Patient able to self feed;Intermittent supervision to cue for compensatory strategies ?Compensations: Minimize environmental  distractions;Slow rate;Small sips/bites ?Postural Changes and/or Swallow Maneuvers: Seated upright 90 degrees  ?   ?    ?   ? ? ? ? Oral Care Recommendations: Oral care BID ?Follow Up Recommendations: No SLP follow up ?Assistance recommended at discharge: None ?SLP Visit Diagnosis: Dysphagia, unspecified (R13.10) ?Plan: All goals met;Discharge SLP treatment due to (comment) (transferring back to ALF today) ? ? ? ? ?  ?  ? ? ?Houston Siren ? ?10/28/2021, 9:45 AM ?

## 2021-10-28 NOTE — Plan of Care (Signed)
Pt being discharged

## 2021-10-28 NOTE — NC FL2 (Signed)
?[place] MEDICAID FL2 LEVEL OF CARE SCREENING TOOL  ?  ? ?IDENTIFICATION  ?Patient Name: ?Amy Solis Birthdate: 20-Jun-1951 Sex: female Admission Date (Current Location): ?10/26/2021  ?[place] and [place] Number: ? [place] ?  Facility and Address:  ?[facility] [place] [address], [place], [place] [address] ?     Provider Number: ?[id number]  ?Attending Physician Name and Address:  ?Shelly Coss, MD ? Relative Name and Phone Number:  ?  ?   ?Current Level of Care: ?Hospital Recommended Level of Care: ?Assisted Living Facility Prior Approval Number: ?  ? ?Date Approved/Denied: ?  PASRR Number: ?  ? ?Discharge Plan: ?Other (Comment) (ALF) ?  ? ?Current Diagnoses: ?Patient Active Problem List  ? Diagnosis Date Noted  ? Multifocal pneumonia 10/26/2021  ? DNR (do not resuscitate) 10/26/2021  ? Hyperthyroidism 10/22/2021  ? Vascular dementia ([place]) 10/22/2021  ? COPD (chronic obstructive pulmonary disease) ([place]) 10/22/2021  ? Chronic respiratory failure with hypoxia ([place]) 10/22/2021  ? Essential hypertension 10/22/2021  ? Hypokalemia 10/22/2021  ? Toxic encephalopathy 10/21/2021  ? Acute encephalopathy 10/20/2021  ? AMS (altered mental status) 10/20/2021  ? Benzodiazepine causing adverse effect in therapeutic use 02/15/2019  ? Hypoxemic encephalopathy ([place]) 02/15/2019  ? Diastolic dysfunction [id number]  ? Nocturnal hypoxemia 05/09/2016  ? Coronary artery calcification 05/09/2016  ? Family history of early CAD 05/09/2016  ? Dyspnea 03/09/2016  ? ? ?Orientation RESPIRATION BLADDER Height & Weight   ?  ?Self ? O2 Incontinent Weight:   ?Height:     ?BEHAVIORAL SYMPTOMS/MOOD NEUROLOGICAL BOWEL NUTRITION STATUS  ?    Incontinent Diet  ?AMBULATORY STATUS COMMUNICATION OF NEEDS Skin   ?Extensive Assist Verbally Normal ?  ?  ?  ?    ?     ?     ? ? ?Personal Care Assistance Level of Assistance  ?Bathing, Feeding, Dressing Bathing Assistance: Limited assistance ?Feeding assistance: Limited  assistance ?Dressing Assistance: Limited assistance ?   ? ?Functional Limitations Info  ?Sight, Hearing, Speech Sight Info: Adequate ?Hearing Info: Adequate ?Speech Info: Impaired  ? ? ?SPECIAL CARE FACTORS FREQUENCY  ?PT (By licensed PT), OT (By licensed OT)   ?  ?PT Frequency: 3x a week ?OT Frequency: 3x a week ?  ?  ?  ?   ? ? ?Contractures Contractures Info: Not present  ? ? ?Additional Factors Info  ?Code Status, Allergies, Psychotropic Code Status Info: DNR ?Allergies Info: NKA ?Psychotropic Info: Depakote 125mg  2x a day, raxil 30mg  daily, Risperdal .5mg  2x a day, 1mg  at bedtime, Ativan .5mg  daily PRN ?  ?  ?   ? ?Current Medications (10/28/2021):  This is the current hospital active medication list ?Current Facility-Administered Medications  ?Medication Dose Route Frequency Provider Last Rate Last Admin  ? acetaminophen (TYLENOL) tablet 650 mg  650 mg Oral Q6H PRN Karmen Bongo, MD      ? Or  ? acetaminophen (TYLENOL) suppository 650 mg  650 mg Rectal Q6H PRN Karmen Bongo, MD      ? albuterol (PROVENTIL) (2.5 MG/3ML) 0.083% nebulizer solution 2.5 mg  2.5 mg Nebulization Q2H PRN Karmen Bongo, MD      ? amLODipine (NORVASC) tablet 5 mg  5 mg Oral Daily Karmen Bongo, MD   5 mg at 10/27/21 1000  ? aspirin chewable tablet 81 mg  81 mg Oral Daily Karmen Bongo, MD   81 mg at 10/27/21 1000  ? azithromycin (ZITHROMAX) 500 mg in sodium chloride 0.9 % 250 mL  IVPB  500 mg Intravenous Q24H Karmen Bongo, MD   Stopped at 10/27/21 1358  ? bisacodyl (DULCOLAX) EC tablet 5 mg  5 mg Oral Daily PRN Karmen Bongo, MD      ? cefTRIAXone (ROCEPHIN) 2 g in sodium chloride 0.9 % 100 mL IVPB  2 g Intravenous Q24H Karmen Bongo, MD   Stopped at 10/27/21 1047  ? chlorhexidine (PERIDEX) 0.12 % solution 15 mL  15 mL Mouth/Throat BID Karmen Bongo, MD   15 mL at 10/27/21 2121  ? divalproex (DEPAKOTE) DR tablet 125 mg  125 mg Oral BID Karmen Bongo, MD   125 mg at 10/27/21 2157  ? docusate sodium (COLACE) capsule 100  mg  100 mg Oral BID Karmen Bongo, MD   100 mg at 10/27/21 2121  ? enoxaparin (LOVENOX) injection 40 mg  40 mg Subcutaneous Daily Karmen Bongo, MD   40 mg at 10/27/21 1009  ? fluticasone (FLONASE) 50 MCG/ACT nasal spray 2 spray  2 spray Each Nare Daily Karmen Bongo, MD      ? guaiFENesin (MUCINEX) 12 hr tablet 600 mg  600 mg Oral BID PRN Karmen Bongo, MD      ? hydrALAZINE (APRESOLINE) injection 5 mg  5 mg Intravenous Q4H PRN Karmen Bongo, MD      ? methimazole (TAPAZOLE) tablet 5 mg  5 mg Oral Daily Karmen Bongo, MD   5 mg at 10/27/21 [id number]  ? morphine (PF) 2 MG/ML injection 2 mg  2 mg Intravenous Q2H PRN Karmen Bongo, MD   2 mg at 10/27/21 0244  ? ondansetron (ZOFRAN) tablet 4 mg  4 mg Oral Q6H PRN Karmen Bongo, MD      ? Or  ? ondansetron [facility]) injection 4 mg  4 mg Intravenous Q6H PRN Karmen Bongo, MD      ? oxybutynin (DITROPAN-XL) 24 hr tablet 10 mg  10 mg Oral Ivery Quale, MD   10 mg at 10/27/21 2157  ? oxyCODONE (Oxy IR/ROXICODONE) immediate release tablet 5 mg  5 mg Oral Q4H PRN Karmen Bongo, MD      ? pantoprazole (PROTONIX) EC tablet 40 mg  40 mg Oral Daily Karmen Bongo, MD   40 mg at 10/27/21 1001  ? PARoxetine (PAXIL) tablet 30 mg  30 mg Oral Ivery Quale, MD   30 mg at 10/28/21 0248  ? polyethylene glycol (MIRALAX / GLYCOLAX) packet 17 g  17 g Oral Daily PRN Karmen Bongo, MD      ? sodium chloride (MURO 128) 5 % ophthalmic solution 1 drop  1 drop Both Eyes TID Karmen Bongo, MD   1 drop at 10/27/21 2157  ? ? ? ?Discharge Medications: ?STOP taking these medications   ?  ?LORazepam 0.5 MG tablet ?Commonly known as: ATIVAN ?   ?  ?   ?  ?TAKE these medications   ?  ?acetaminophen 325 MG tablet ?Commonly known as: TYLENOL ?Take 650 mg by mouth every 6 (six) hours as needed for mild pain or fever. ?   ?amLODipine 5 MG tablet ?Commonly known as: NORVASC ?Take 5 mg by mouth daily. ?   ?ammonium lactate 12 % lotion ?Commonly known as: LAC-HYDRIN ?Apply 1  application. topically at bedtime. To bilateral legs and feet ?   ?aspirin 81 MG tablet ?Take 81 mg by mouth daily. ?   ?azithromycin 500 MG tablet ?Commonly known as: Zithromax ?Take 1 tablet (500 mg total) by mouth daily for 3 days. Take 1 tablet daily for  3 days. ?   ?benazepril 20 MG tablet ?Commonly known as: LOTENSIN ?Take 20 mg by mouth at bedtime. ?   ?cefdinir 300 MG capsule ?Commonly known as: OMNICEF ?Take 1 capsule (300 mg total) by mouth 2 (two) times daily for 3 days. ?   ?chlorhexidine 0.12 % solution ?Commonly known as: PERIDEX ?Use as directed 15 mLs in the mouth or throat 2 (two) times daily. ?   ?divalproex 125 MG DR tablet ?Commonly known as: DEPAKOTE ?Take 125 mg by mouth 2 (two) times daily. ?   ?fluticasone 50 MCG/ACT nasal spray ?Commonly known as: FLONASE ?Place 2 sprays into both nostrils daily. ?   ?gabapentin 100 MG capsule ?Commonly known as: NEURONTIN ?Take 200 mg by mouth at bedtime. ?   ?meloxicam 15 MG tablet ?Commonly known as: MOBIC ?Take 15 mg by mouth daily. ?   ?methimazole 5 MG tablet ?Commonly known as: TAPAZOLE ?Take 5 mg by mouth daily. ?   ?multivitamin with minerals Tabs tablet ?Take 1 tablet by mouth daily. ?   ?omeprazole 20 MG capsule ?Commonly known as: PRILOSEC ?Take 20 mg by mouth daily. ?   ?oxybutynin 10 MG 24 hr tablet ?Commonly known as: DITROPAN-XL ?Take 10 mg by mouth at bedtime. ?   ?PARoxetine 30 MG tablet ?Commonly known as: PAXIL ?Take 30 mg by mouth at bedtime. ?   ?potassium chloride SA 20 MEQ tablet ?Commonly known as: KLOR-CON M ?Take 1 tablet (20 mEq total) by mouth daily for 5 days. ?   ?risperiDONE 1 MG tablet ?Commonly known as: RISPERDAL ?Take 1 mg by mouth at bedtime. Also has entry for 0.5mg  dose in the morning and afternoon ?   ?risperiDONE 0.5 MG tablet ?Commonly known as: RISPERDAL ?Take 0.5 mg by mouth 2 (two) times daily with breakfast and lunch. Also has entry for 1mg  dose at bedtime ?   ?sodium chloride 1 g tablet ?Take 1 g by mouth every  Monday, Wednesday, and Friday. ?   ?sodium chloride 5 % ophthalmic solution ?Commonly known as: MURO 128 ?Place 1 drop into both eyes 3 (three) times daily.  ? ? ?Relevant Imaging Results: ? ?Relevant Lab Results: ?

## 2021-10-28 NOTE — TOC Transition Note (Signed)
Transition of Care (TOC) - CM/SW Discharge Note ? ? ?Patient Details  ?Name: Amy Solis ?MRN: [id number] ?Date of Birth: 1951-04-05 ? ?Transition of Care (TOC) CM/SW Contact:  ?Lockie Pares, RN ?Phone Number: ?10/28/2021, 11:32 AM ? ? ?Clinical Narrative:    ? ?Left another message for [facility] from [place].  ? ?Final next level of care: Assisted Living ?Barriers to Discharge: No Barriers Identified ? ? ?Patient Goals and CMS Choice ?  ?  ?  ? ?Discharge Placement ?  ?           ?  ?  ?  ?  ? ?Discharge Plan and Services ?  ?  ?Post Acute Care Choice: Nursing Home          ?  ?  ?  ?  ?  ?HH Arranged: PT ?HH Agency:  International aid/development worker) ?Date HH Agency Contacted: 10/27/21 ?Time HH Agency Contacted: 1250 ?Representative spoke with at [facility] Agency: Rene Kocher  Left message ? ?Social Determinants of Health (SDOH) Interventions ?  ? ? ?Readmission Risk Interventions ?   ? View : No data to display.  ?  ?  ?  ? ? ? ? ? ?

## 2021-10-28 NOTE — TOC Transition Note (Signed)
Transition of Care (TOC) - CM/SW Discharge Note ? ? ?Patient Details  ?Name: Amy Solis ?MRN: [id number] ?Date of Birth: 1950-09-07 ? ?Transition of Care (TOC) CM/SW Contact:  ?Jimmy Picket, LCSW ?Phone Number: ?10/28/2021, 12:15 PM ? ? ?Clinical Narrative:    ?  ?Per MD patient ready for DC to [facility]. RN, patient, patient's family, and facility notified of DC. Discharge Summary and FL2 sent to facility via fax at [phone]. DC packet on chart. . Ambulance transport requested for patient.  ?  ?RN to call report to [place] at [phone]. ? ?CSW will sign off for now as social work intervention is no longer needed. Please consult [place] again if new needs arise. ? ? ?Final next level of care: Assisted Living ?Barriers to Discharge: No Barriers Identified ? ? ?Patient Goals and CMS Choice ?  ?  ?  ? ?Discharge Placement ?  ?           ?Patient chooses bed at: [facility] ?Patient to be transferred to facility by: [facility] ?Name of family member notified: Informed [place] wife, LVM on cell phone ?Patient and family notified of of transfer: 10/28/21 ? ?Discharge Plan and Services ?  ?  ?Post Acute Care Choice: Nursing Home          ?  ?  ?  ?  ?  ?HH Arranged: PT ?HH Agency:  International aid/development worker) ?Date HH Agency Contacted: 10/27/21 ?Time HH Agency Contacted: 1250 ?Representative spoke with at [facility] Agency: Rene Kocher  Left message ? ?Social Determinants of Health (SDOH) Interventions ?  ? ? ?Readmission Risk Interventions ?   ? View : No data to display.  ?  ?  ?  ? ?Jimmy Picket, LCSW ?Clinical Social Worker ? ? ? ? ? ?

## 2021-10-28 NOTE — Discharge Summary (Signed)
Physician Discharge Summary  ?Amy [name] DOB: 12-Jun-1951 DOA: 10/26/2021 ? ?PCP: Amy Jenny, MD ? ?Admit date: 10/26/2021 ?Discharge date: 10/28/2021 ? ?Admitted From: ALF ?Disposition:  ALF ? ?Discharge Condition:Stable ?CODE STATUS:DNR ?Diet recommendation:  Regular ? ?Brief/Interim Summary: ? ?Patient is a 71 year old female with history of hypertension, hyperlipidemia, intellectual disability, vascular dementia, depression who presented from ALF with altered mental status.  She was recently admitted here and was treated for UTI and was discharged to ALF.  On presentation she was found to be somnolent.  Chest x-ray showed left multilobar pneumonia, parapneumonic effusion.  Started on antibiotics.  Respiratory status has significantly improved and she is currently on room air.  PT/OT consulted who recommended discharge back to ALF.  Mental status has significantly improved when she is currently at baseline.  Medically stable for discharge back to ALF today. ? ?Following problems were addressed during her hospitalization: ? ?  ?Multifocal pneumonia: Presented with altered mental status, hypoxia, chest x-ray showed diffuse infiltrates in the left lung. .  Aspiration pneumonia is a possibility.  Speech therapy consulted, initially started on dysphagia 1 diet but since her mental status significantly improved she tolerated regular diet.Started on ceftriaxone, azithromycin, will continue for 3 more days with oral antibiotics ? ?acute metabolic encephalopathy: Could be complicated by pneumonia but currently her mental status is close to baseline.  Not oriented to time ?  ?Vascular dementia/anxiety/depression: On risperidone, gabapentin, Depakote, paroxetine, Ativan at ALF. ?Continue supportive care.  We discontinued Ativan.   ? ?Chronic hypoxic respiratory failure: Chronically on 2 L of oxygen per minute.  Currently respiratory status stable, on room air this morning ?  ?Hypertension: On amlodipine,  benazepril.  Monitor blood pressure ?  ?Hypothyroidism: Continue methimazole ?  ?CODE STATUS: Currently she is DNR ?  ?Debility/deconditioning: Lives in ALF.  Has been weak, deconditioned.  PT/OT recommended discharge back to ALF ? ? ?Discharge Diagnoses:  ?Principal Problem: ?  Multifocal pneumonia ?Active Problems: ?  Hyperthyroidism ?  Vascular dementia (HCC) ?  Chronic respiratory failure with hypoxia (HCC) ?  Essential hypertension ?  DNR (do not resuscitate) ? ? ? ?Discharge Instructions ? ?Discharge Instructions   ? ? Diet general   Complete by: As directed ?  ? Discharge instructions   Complete by: As directed ?  ? 1)Please take prescribed medications as instructed  ? ?  ? ?Allergies as of 10/28/2021   ?No Known Allergies ?  ? ?  ?Medication List  ?  ? ?STOP taking these medications   ? ?LORazepam 0.5 MG tablet ?Commonly known as: ATIVAN ?  ? ?  ? ?TAKE these medications   ? ?acetaminophen 325 MG tablet ?Commonly known as: TYLENOL ?Take 650 mg by mouth every 6 (six) hours as needed for mild pain or fever. ?  ?amLODipine 5 MG tablet ?Commonly known as: NORVASC ?Take 5 mg by mouth daily. ?  ?ammonium lactate 12 % lotion ?Commonly known as: LAC-HYDRIN ?Apply 1 application. topically at bedtime. To bilateral legs and feet ?  ?aspirin 81 MG tablet ?Take 81 mg by mouth daily. ?  ?azithromycin 500 MG tablet ?Commonly known as: Zithromax ?Take 1 tablet (500 mg total) by mouth daily for 3 days. Take 1 tablet daily for 3 days. ?  ?benazepril 20 MG tablet ?Commonly known as: LOTENSIN ?Take 20 mg by mouth at bedtime. ?  ?cefdinir 300 MG capsule ?Commonly known as: OMNICEF ?Take 1 capsule (300 mg total) by mouth 2 (two) times daily for 3  days. ?  ?chlorhexidine 0.12 % solution ?Commonly known as: PERIDEX ?Use as directed 15 mLs in the mouth or throat 2 (two) times daily. ?  ?divalproex 125 MG DR tablet ?Commonly known as: DEPAKOTE ?Take 125 mg by mouth 2 (two) times daily. ?  ?fluticasone 50 MCG/ACT nasal spray ?Commonly  known as: FLONASE ?Place 2 sprays into both nostrils daily. ?  ?gabapentin 100 MG capsule ?Commonly known as: NEURONTIN ?Take 200 mg by mouth at bedtime. ?  ?meloxicam 15 MG tablet ?Commonly known as: MOBIC ?Take 15 mg by mouth daily. ?  ?methimazole 5 MG tablet ?Commonly known as: TAPAZOLE ?Take 5 mg by mouth daily. ?  ?multivitamin with minerals Tabs tablet ?Take 1 tablet by mouth daily. ?  ?omeprazole 20 MG capsule ?Commonly known as: PRILOSEC ?Take 20 mg by mouth daily. ?  ?oxybutynin 10 MG 24 hr tablet ?Commonly known as: DITROPAN-XL ?Take 10 mg by mouth at bedtime. ?  ?PARoxetine 30 MG tablet ?Commonly known as: PAXIL ?Take 30 mg by mouth at bedtime. ?  ?potassium chloride SA 20 MEQ tablet ?Commonly known as: KLOR-CON M ?Take 1 tablet (20 mEq total) by mouth daily for 5 days. ?  ?risperiDONE 1 MG tablet ?Commonly known as: RISPERDAL ?Take 1 mg by mouth at bedtime. Also has entry for 0.5mg  dose in the morning and afternoon ?  ?risperiDONE 0.5 MG tablet ?Commonly known as: RISPERDAL ?Take 0.5 mg by mouth 2 (two) times daily with breakfast and lunch. Also has entry for 1mg  dose at bedtime ?  ?sodium chloride 1 g tablet ?Take 1 g by mouth every Monday, Wednesday, and Friday. ?  ?sodium chloride 5 % ophthalmic solution ?Commonly known as: MURO 128 ?Place 1 drop into both eyes 3 (three) times daily. ?  ? ?  ? ? Follow-up Information   ? ? Amy Solis, Henry, MD. Schedule an appointment as soon as possible for a visit in 1 week(s).   ?Specialty: Family Medicine ?Contact information: ?[address] ?[address] ?[place] [id number] ?[phone] ? ? ?  ?  ? ?  ?  ? ?  ? ?No Known Allergies ? ?Consultations: ?None ? ? ?Procedures/Studies: ?CT Head Wo Contrast ? ?Result Date: 10/20/2021 ?CLINICAL DATA:  A 71 year old female presents for evaluation of mental status changes of unknown cause. EXAM: CT HEAD WITHOUT CONTRAST TECHNIQUE: Contiguous axial images were obtained from the base of the skull through the vertex without  intravenous contrast. RADIATION DOSE REDUCTION: This exam was performed according to the departmental dose-optimization program which includes automated exposure control, adjustment of the mA and/or kV according to patient size and/or use of iterative reconstruction technique. COMPARISON:  February 15, 2019. FINDINGS: Brain: Confluent area of low attenuation in the LEFT frontal lobe that extends to the cortex not present on previous imaging. 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 Subtle area of low attenuation noted in the LEFT frontal region on the previous exam this is much more extensive than was seen on the prior study. Signs of cerebral atrophy. No intracranial hemorrhage. No signs of mass effect or midline shift. Vascular: No hyperdense vessel or unexpected calcification. Skull: Normal. Negative for fracture or focal lesion. Sinuses/Orbits: Mild mucoperiosteal thickening of the RIGHT maxillary sinus. No acute orbital process. Other: None IMPRESSION: 1. Area of low attenuation in the LEFT frontal lobe extending to the cortex more extensive than on previous imaging from August of 2020 potentially related to prior insult and encephalomalacia developing  in an area of prior infarct; though difficult to exclude the possibility of superimposed acute or subacute ischemia given the more extensive appearance of this on CT currently. If there is clinical suspicion for acute ischemia, MRI may be helpful for additional evaluation. 2. Signs of cerebral atrophy. 3. Agenesis of the corpus callosum. 4. Mild RIGHT maxillary sinus disease. Electronically Signed   By: Donzetta Kohut M.D.   On: 10/20/2021 13:51  ? ?DG Chest Port 1 View ? ?Result Date: 10/26/2021 ?CLINICAL DATA:  72 year old female with history of altered mental status. Unresponsive. EXAM: PORTABLE CHEST 1 VIEW COMPARISON:  Chest x-ray 09/30/2021. FINDINGS: Lung volumes are low. Patchy ill-defined opacities and  areas of interstitial prominence are noted throughout the mid to lower left lung. Small left pleural effusion. Severe elevation of the right hemidiaphragm. No pneumothorax. No evidence of pulmonary edema. Hea

## 2021-10-31 LAB — CULTURE, BLOOD (ROUTINE X 2)
Culture: NO GROWTH
Culture: NO GROWTH
Special Requests: ADEQUATE
Special Requests: ADEQUATE

## 2021-12-01 ENCOUNTER — Ambulatory Visit
Admission: RE | Admit: 2021-12-01 | Discharge: 2021-12-01 | Disposition: A | Discharge status: Home / Self Care | Payer: Medicare Other | Source: Ambulatory Visit | Attending: Geriatric Medicine | Admitting: Geriatric Medicine

## 2021-12-01 DIAGNOSIS — Z1231 Encounter for screening mammogram for malignant neoplasm of breast: Secondary | ICD-10-CM

## 2021-12-02 ENCOUNTER — Other Ambulatory Visit: Payer: Self-pay | Admitting: Geriatric Medicine

## 2021-12-02 DIAGNOSIS — R928 Other abnormal and inconclusive findings on diagnostic imaging of breast: Secondary | ICD-10-CM

## 2021-12-15 ENCOUNTER — Other Ambulatory Visit: Payer: Self-pay | Admitting: Student

## 2021-12-15 ENCOUNTER — Ambulatory Visit
Admission: RE | Admit: 2021-12-15 | Discharge: 2021-12-15 | Disposition: A | Discharge status: Home / Self Care | Payer: Medicare Other | Source: Ambulatory Visit | Attending: Geriatric Medicine | Admitting: Geriatric Medicine

## 2021-12-15 ENCOUNTER — Ambulatory Visit: Payer: Medicare Other

## 2021-12-15 DIAGNOSIS — R928 Other abnormal and inconclusive findings on diagnostic imaging of breast: Secondary | ICD-10-CM

## 2022-01-10 ENCOUNTER — Inpatient Hospital Stay (HOSPITAL_COMMUNITY)
Admission: EM | Admit: 2022-01-10 | Discharge: 2022-01-12 | DRG: 682 | Disposition: A | Discharge status: Home / Self Care | Payer: Medicare Other | Source: Skilled Nursing Facility | Attending: Family Medicine | Admitting: Family Medicine

## 2022-01-10 ENCOUNTER — Encounter (HOSPITAL_COMMUNITY): Payer: Self-pay | Admitting: Oncology

## 2022-01-10 ENCOUNTER — Other Ambulatory Visit: Payer: Self-pay

## 2022-01-10 ENCOUNTER — Inpatient Hospital Stay (HOSPITAL_COMMUNITY): Payer: Medicare Other

## 2022-01-10 ENCOUNTER — Emergency Department (HOSPITAL_COMMUNITY): Payer: Medicare Other

## 2022-01-10 DIAGNOSIS — M810 Age-related osteoporosis without current pathological fracture: Secondary | ICD-10-CM | POA: Diagnosis present

## 2022-01-10 DIAGNOSIS — N179 Acute kidney failure, unspecified: Principal | ICD-10-CM | POA: Diagnosis present

## 2022-01-10 DIAGNOSIS — E875 Hyperkalemia: Secondary | ICD-10-CM | POA: Diagnosis present

## 2022-01-10 DIAGNOSIS — Z90710 Acquired absence of both cervix and uterus: Secondary | ICD-10-CM

## 2022-01-10 DIAGNOSIS — R4182 Altered mental status, unspecified: Principal | ICD-10-CM

## 2022-01-10 DIAGNOSIS — G9341 Metabolic encephalopathy: Secondary | ICD-10-CM | POA: Diagnosis present

## 2022-01-10 DIAGNOSIS — Z9981 Dependence on supplemental oxygen: Secondary | ICD-10-CM

## 2022-01-10 DIAGNOSIS — E0590 Thyrotoxicosis, unspecified without thyrotoxic crisis or storm: Secondary | ICD-10-CM | POA: Diagnosis present

## 2022-01-10 DIAGNOSIS — Z8249 Family history of ischemic heart disease and other diseases of the circulatory system: Secondary | ICD-10-CM | POA: Diagnosis not present

## 2022-01-10 DIAGNOSIS — Z79899 Other long term (current) drug therapy: Secondary | ICD-10-CM | POA: Diagnosis not present

## 2022-01-10 DIAGNOSIS — Z791 Long term (current) use of non-steroidal anti-inflammatories (NSAID): Secondary | ICD-10-CM

## 2022-01-10 DIAGNOSIS — I959 Hypotension, unspecified: Secondary | ICD-10-CM | POA: Diagnosis present

## 2022-01-10 DIAGNOSIS — J189 Pneumonia, unspecified organism: Secondary | ICD-10-CM

## 2022-01-10 DIAGNOSIS — K219 Gastro-esophageal reflux disease without esophagitis: Secondary | ICD-10-CM | POA: Diagnosis present

## 2022-01-10 DIAGNOSIS — E860 Dehydration: Secondary | ICD-10-CM | POA: Diagnosis present

## 2022-01-10 DIAGNOSIS — F32A Depression, unspecified: Secondary | ICD-10-CM | POA: Diagnosis present

## 2022-01-10 DIAGNOSIS — E785 Hyperlipidemia, unspecified: Secondary | ICD-10-CM | POA: Diagnosis present

## 2022-01-10 DIAGNOSIS — Z66 Do not resuscitate: Secondary | ICD-10-CM | POA: Diagnosis present

## 2022-01-10 DIAGNOSIS — Z7982 Long term (current) use of aspirin: Secondary | ICD-10-CM | POA: Diagnosis not present

## 2022-01-10 DIAGNOSIS — Z20822 Contact with and (suspected) exposure to covid-19: Secondary | ICD-10-CM | POA: Diagnosis present

## 2022-01-10 DIAGNOSIS — J9611 Chronic respiratory failure with hypoxia: Secondary | ICD-10-CM | POA: Diagnosis present

## 2022-01-10 DIAGNOSIS — F70 Mild intellectual disabilities: Secondary | ICD-10-CM | POA: Diagnosis present

## 2022-01-10 DIAGNOSIS — F0150 Vascular dementia without behavioral disturbance: Secondary | ICD-10-CM | POA: Diagnosis present

## 2022-01-10 DIAGNOSIS — Z803 Family history of malignant neoplasm of breast: Secondary | ICD-10-CM | POA: Diagnosis not present

## 2022-01-10 DIAGNOSIS — I10 Essential (primary) hypertension: Secondary | ICD-10-CM | POA: Diagnosis present

## 2022-01-10 DIAGNOSIS — J449 Chronic obstructive pulmonary disease, unspecified: Secondary | ICD-10-CM | POA: Diagnosis present

## 2022-01-10 LAB — BLOOD GAS, ARTERIAL
Acid-base deficit: 2.1 mmol/L — ABNORMAL HIGH (ref 0.0–2.0)
Bicarbonate: 25.7 mmol/L (ref 20.0–28.0)
Drawn by: 54496
FIO2: 32 %
O2 Content: 3 L/min
O2 Saturation: 97.2 %
Patient temperature: 36.8
RATE: 11 resp/min
pCO2 arterial: 56 mmHg — ABNORMAL HIGH (ref 32–48)
pH, Arterial: 7.27 — ABNORMAL LOW (ref 7.35–7.45)
pO2, Arterial: 85 mmHg (ref 83–108)

## 2022-01-10 LAB — CBC WITH DIFFERENTIAL/PLATELET
Abs Immature Granulocytes: 0.03 10*3/uL (ref 0.00–0.07)
Basophils Absolute: 0 10*3/uL (ref 0.0–0.1)
Basophils Relative: 1 %
Eosinophils Absolute: 0.2 10*3/uL (ref 0.0–0.5)
Eosinophils Relative: 4 %
HCT: 40 % (ref 36.0–46.0)
Hemoglobin: 12.3 g/dL (ref 12.0–15.0)
Immature Granulocytes: 1 %
Lymphocytes Relative: 16 %
Lymphs Abs: 1 10*3/uL (ref 0.7–4.0)
MCH: 31.4 pg (ref 26.0–34.0)
MCHC: 30.8 g/dL (ref 30.0–36.0)
MCV: 102 fL — ABNORMAL HIGH (ref 80.0–100.0)
Monocytes Absolute: 1 10*3/uL (ref 0.1–1.0)
Monocytes Relative: 17 %
Neutro Abs: 4 10*3/uL (ref 1.7–7.7)
Neutrophils Relative %: 61 %
Platelets: 302 10*3/uL (ref 150–400)
RBC: 3.92 MIL/uL (ref 3.87–5.11)
RDW: 15.7 % — ABNORMAL HIGH (ref 11.5–15.5)
WBC: 6.3 10*3/uL (ref 4.0–10.5)
nRBC: 0 % (ref 0.0–0.2)

## 2022-01-10 LAB — RAPID URINE DRUG SCREEN, HOSP PERFORMED
Amphetamines: NOT DETECTED
Barbiturates: NOT DETECTED
Benzodiazepines: POSITIVE — AB
Cocaine: NOT DETECTED
Opiates: NOT DETECTED
Tetrahydrocannabinol: NOT DETECTED

## 2022-01-10 LAB — TROPONIN I (HIGH SENSITIVITY)
Troponin I (High Sensitivity): 4 ng/L (ref <18)
Troponin I (High Sensitivity): 4 ng/L (ref <18)

## 2022-01-10 LAB — RESP PANEL BY RT-PCR (FLU A&B, COVID) ARPGX2
Influenza A by PCR: NEGATIVE
Influenza B by PCR: NEGATIVE
SARS Coronavirus 2 by RT PCR: NEGATIVE

## 2022-01-10 LAB — COMPREHENSIVE METABOLIC PANEL
ALT: 9 U/L (ref 0–44)
AST: 9 U/L — ABNORMAL LOW (ref 15–41)
Albumin: 3 g/dL — ABNORMAL LOW (ref 3.5–5.0)
Alkaline Phosphatase: 66 U/L (ref 38–126)
Anion gap: 6 (ref 5–15)
BUN: 45 mg/dL — ABNORMAL HIGH (ref 8–23)
CO2: 26 mmol/L (ref 22–32)
Calcium: 8.3 mg/dL — ABNORMAL LOW (ref 8.9–10.3)
Chloride: 114 mmol/L — ABNORMAL HIGH (ref 98–111)
Creatinine, Ser: 1.7 mg/dL — ABNORMAL HIGH (ref 0.44–1.00)
GFR, Estimated: 32 mL/min — ABNORMAL LOW (ref >60)
Glucose, Bld: 86 mg/dL (ref 70–99)
Potassium: 5 mmol/L (ref 3.5–5.1)
Sodium: 146 mmol/L — ABNORMAL HIGH (ref 135–145)
Total Bilirubin: 0.5 mg/dL (ref 0.3–1.2)
Total Protein: 6.3 g/dL — ABNORMAL LOW (ref 6.5–8.1)

## 2022-01-10 LAB — URINALYSIS, ROUTINE W REFLEX MICROSCOPIC
Bilirubin Urine: NEGATIVE
Glucose, UA: NEGATIVE mg/dL
Ketones, ur: NEGATIVE mg/dL
Nitrite: NEGATIVE
Protein, ur: 100 mg/dL — AB
RBC / HPF: >50 RBC/hpf — ABNORMAL HIGH (ref 0–5)
Specific Gravity, Urine: 1.018 (ref 1.005–1.030)
WBC, UA: >50 WBC/hpf — ABNORMAL HIGH (ref 0–5)
pH: 7 (ref 5.0–8.0)

## 2022-01-10 LAB — CBG MONITORING, ED: Glucose-Capillary: 78 mg/dL (ref 70–99)

## 2022-01-10 LAB — TYPE AND SCREEN
ABO/RH(D): O POS
Antibody Screen: NEGATIVE

## 2022-01-10 LAB — LACTIC ACID, PLASMA
Lactic Acid, Venous: 0.6 mmol/L (ref 0.5–1.9)
Lactic Acid, Venous: 0.7 mmol/L (ref 0.5–1.9)

## 2022-01-10 LAB — PROTIME-INR
INR: 1 (ref 0.8–1.2)
Prothrombin Time: 13 seconds (ref 11.4–15.2)

## 2022-01-10 MED ORDER — ACETAMINOPHEN 650 MG RE SUPP: 650.0000 mg | Freq: Four times a day (QID) | RECTAL | Status: DC | PRN

## 2022-01-10 MED ORDER — METHIMAZOLE 5 MG PO TABS
5.0000 mg | ORAL_TABLET | Freq: Every day | ORAL | Status: DC
  Administered 2022-01-10 – 2022-01-12 (×3): 5 mg via ORAL
  Filled 2022-01-10 (×3): qty 1

## 2022-01-10 MED ORDER — ACETAMINOPHEN 325 MG PO TABS: 650.0000 mg | ORAL_TABLET | Freq: Four times a day (QID) | ORAL | Status: DC | PRN

## 2022-01-10 MED ORDER — SODIUM CHLORIDE 0.9 % IV BOLUS
1000.0000 mL | Freq: Once | INTRAVENOUS | Status: AC
Start: 2022-01-10 — End: 2022-01-10
  Administered 2022-01-10: 1000 mL via INTRAVENOUS

## 2022-01-10 MED ORDER — RISPERIDONE 0.5 MG PO TABS
0.5000 mg | ORAL_TABLET | Freq: Two times a day (BID) | ORAL | Status: DC
  Administered 2022-01-10 – 2022-01-12 (×4): 0.5 mg via ORAL
  Filled 2022-01-10 (×4): qty 1

## 2022-01-10 MED ORDER — SODIUM CHLORIDE 0.9 % IV SOLN: 2.0000 g | INTRAVENOUS | Status: DC

## 2022-01-10 MED ORDER — RISPERIDONE 1 MG PO TABS
1.0000 mg | ORAL_TABLET | Freq: Every day | ORAL | Status: DC
  Administered 2022-01-10 – 2022-01-11 (×2): 1 mg via ORAL
  Filled 2022-01-10 (×2): qty 1

## 2022-01-10 MED ORDER — OXYBUTYNIN CHLORIDE ER 5 MG PO TB24
10.0000 mg | ORAL_TABLET | Freq: Every day | ORAL | Status: DC
  Administered 2022-01-10 – 2022-01-11 (×2): 10 mg via ORAL
  Filled 2022-01-10 (×2): qty 2

## 2022-01-10 MED ORDER — ONDANSETRON HCL 4 MG/2ML IJ SOLN: 4.0000 mg | Freq: Four times a day (QID) | INTRAMUSCULAR | Status: DC | PRN

## 2022-01-10 MED ORDER — IPRATROPIUM-ALBUTEROL 0.5-2.5 (3) MG/3ML IN SOLN: 3.0000 mL | Freq: Four times a day (QID) | RESPIRATORY_TRACT | Status: DC | PRN

## 2022-01-10 MED ORDER — VANCOMYCIN HCL 1500 MG/300ML IV SOLN
1500.0000 mg | Freq: Once | INTRAVENOUS | Status: AC
  Administered 2022-01-10: 1500 mg via INTRAVENOUS
  Filled 2022-01-10: qty 300

## 2022-01-10 MED ORDER — SODIUM CHLORIDE 0.9 % IV SOLN
2.0000 g | Freq: Once | INTRAVENOUS | Status: AC
  Administered 2022-01-10: 2 g via INTRAVENOUS
  Filled 2022-01-10: qty 12.5

## 2022-01-10 MED ORDER — FLUTICASONE PROPIONATE 50 MCG/ACT NA SUSP
2.0000 | Freq: Every day | NASAL | Status: DC
Start: 2022-01-11 — End: 2022-01-12
  Administered 2022-01-11 – 2022-01-12 (×2): 2 via NASAL
  Filled 2022-01-10: qty 16

## 2022-01-10 MED ORDER — GUAIFENESIN 100 MG/5ML PO LIQD: 5.0000 mL | ORAL | Status: DC | PRN

## 2022-01-10 MED ORDER — GABAPENTIN 100 MG PO CAPS
200.0000 mg | ORAL_CAPSULE | Freq: Every day | ORAL | Status: DC
  Administered 2022-01-10 – 2022-01-11 (×2): 200 mg via ORAL
  Filled 2022-01-10 (×2): qty 2

## 2022-01-10 MED ORDER — PAROXETINE HCL 20 MG PO TABS
30.0000 mg | ORAL_TABLET | Freq: Every day | ORAL | Status: DC
  Administered 2022-01-11 – 2022-01-12 (×2): 30 mg via ORAL
  Filled 2022-01-10 (×2): qty 1

## 2022-01-10 MED ORDER — SODIUM CHLORIDE (HYPERTONIC) 5 % OP SOLN
1.0000 [drp] | Freq: Three times a day (TID) | OPHTHALMIC | Status: DC
Start: 2022-01-10 — End: 2022-01-12
  Administered 2022-01-10 – 2022-01-12 (×5): 1 [drp] via OPHTHALMIC
  Filled 2022-01-10 (×2): qty 15

## 2022-01-10 MED ORDER — VANCOMYCIN HCL IN DEXTROSE 1-5 GM/200ML-% IV SOLN
1000.0000 mg | INTRAVENOUS | Status: DC
Start: 2022-01-11 — End: 2022-01-11

## 2022-01-10 MED ORDER — PANTOPRAZOLE SODIUM 40 MG PO TBEC
40.0000 mg | DELAYED_RELEASE_TABLET | Freq: Every day | ORAL | Status: DC
  Administered 2022-01-10 – 2022-01-12 (×3): 40 mg via ORAL
  Filled 2022-01-10 (×3): qty 1

## 2022-01-10 MED ORDER — LACTATED RINGERS IV SOLN: INTRAVENOUS | Status: DC

## 2022-01-10 MED ORDER — ASPIRIN 81 MG PO TBEC
81.0000 mg | DELAYED_RELEASE_TABLET | Freq: Every day | ORAL | Status: DC
Start: 2022-01-11 — End: 2022-01-11
  Filled 2022-01-10: qty 1

## 2022-01-10 MED ORDER — ONDANSETRON HCL 4 MG PO TABS: 4.0000 mg | ORAL_TABLET | Freq: Four times a day (QID) | ORAL | Status: DC | PRN

## 2022-01-10 MED ORDER — ENOXAPARIN SODIUM 30 MG/0.3ML IJ SOSY
30.0000 mg | PREFILLED_SYRINGE | INTRAMUSCULAR | Status: DC
Start: 2022-01-10 — End: 2022-01-11
  Administered 2022-01-10: 30 mg via SUBCUTANEOUS
  Filled 2022-01-10: qty 0.3

## 2022-01-10 MED ORDER — DIVALPROEX SODIUM 250 MG PO DR TAB
250.0000 mg | DELAYED_RELEASE_TABLET | Freq: Two times a day (BID) | ORAL | Status: DC
  Administered 2022-01-10 – 2022-01-12 (×4): 250 mg via ORAL
  Filled 2022-01-10 (×4): qty 1

## 2022-01-11 DIAGNOSIS — J449 Chronic obstructive pulmonary disease, unspecified: Secondary | ICD-10-CM

## 2022-01-11 DIAGNOSIS — E0590 Thyrotoxicosis, unspecified without thyrotoxic crisis or storm: Secondary | ICD-10-CM

## 2022-01-11 DIAGNOSIS — I10 Essential (primary) hypertension: Secondary | ICD-10-CM

## 2022-01-11 DIAGNOSIS — F0150 Vascular dementia without behavioral disturbance: Secondary | ICD-10-CM

## 2022-01-11 DIAGNOSIS — N179 Acute kidney failure, unspecified: Secondary | ICD-10-CM | POA: Diagnosis not present

## 2022-01-11 DIAGNOSIS — J9611 Chronic respiratory failure with hypoxia: Secondary | ICD-10-CM | POA: Diagnosis not present

## 2022-01-11 DIAGNOSIS — G9341 Metabolic encephalopathy: Secondary | ICD-10-CM | POA: Diagnosis not present

## 2022-01-11 LAB — PROCALCITONIN: Procalcitonin: <0.1 ng/mL

## 2022-01-11 LAB — MRSA NEXT GEN BY PCR, NASAL: MRSA by PCR Next Gen: NOT DETECTED

## 2022-01-11 LAB — BASIC METABOLIC PANEL
Anion gap: 8 (ref 5–15)
BUN: 21 mg/dL (ref 8–23)
CO2: 24 mmol/L (ref 22–32)
Calcium: 8.8 mg/dL — ABNORMAL LOW (ref 8.9–10.3)
Chloride: 110 mmol/L (ref 98–111)
Creatinine, Ser: 0.88 mg/dL (ref 0.44–1.00)
GFR, Estimated: >60 mL/min (ref >60)
Glucose, Bld: 98 mg/dL (ref 70–99)
Potassium: 5.4 mmol/L — ABNORMAL HIGH (ref 3.5–5.1)
Sodium: 142 mmol/L (ref 135–145)

## 2022-01-11 LAB — CORTISOL-AM, BLOOD: Cortisol - AM: 18.1 ug/dL (ref 6.7–22.6)

## 2022-01-11 LAB — STREP PNEUMONIAE URINARY ANTIGEN: Strep Pneumo Urinary Antigen: NEGATIVE

## 2022-01-11 LAB — PROTIME-INR
INR: 0.9 (ref 0.8–1.2)
Prothrombin Time: 12.2 seconds (ref 11.4–15.2)

## 2022-01-11 MED ORDER — CALCIUM GLUCONATE-NACL 1-0.675 GM/50ML-% IV SOLN
1.0000 g | Freq: Once | INTRAVENOUS | Status: AC
  Administered 2022-01-11: 1000 mg via INTRAVENOUS
  Filled 2022-01-11: qty 50

## 2022-01-11 MED ORDER — SODIUM ZIRCONIUM CYCLOSILICATE 10 G PO PACK
10.0000 g | PACK | Freq: Two times a day (BID) | ORAL | Status: AC
  Administered 2022-01-11 (×2): 10 g via ORAL
  Filled 2022-01-11 (×2): qty 1

## 2022-01-11 MED ORDER — SODIUM CHLORIDE 0.45 % IV SOLN: INTRAVENOUS | Status: DC

## 2022-01-11 MED ORDER — ASPIRIN 81 MG PO TBEC
81.0000 mg | DELAYED_RELEASE_TABLET | Freq: Every day | ORAL | Status: DC
  Administered 2022-01-11 – 2022-01-12 (×2): 81 mg via ORAL
  Filled 2022-01-11 (×2): qty 1

## 2022-01-11 MED ORDER — ENOXAPARIN SODIUM 40 MG/0.4ML IJ SOSY
40.0000 mg | PREFILLED_SYRINGE | INTRAMUSCULAR | Status: DC
  Administered 2022-01-11: 40 mg via SUBCUTANEOUS
  Filled 2022-01-11: qty 0.4

## 2022-01-11 NOTE — TOC Progression Note (Signed)
Transition of Care [facility]) - Progression Note    Patient Details  Name: Amy Solis MRN: [id number] Date of Birth: July 01, 1951  Transition of Care [facility]) CM/SW Contact  Geni Bers, RN Phone Number: 01/11/2022, 2:11 PM  Clinical Narrative:     Pt is from [facility], ALF/Memory Care.  TOC will continue to follow.   Expected Discharge Plan: Assisted Living [facility]) Barriers to Discharge: No Barriers Identified  Expected Discharge Plan and Services Expected Discharge Plan: Assisted Living [facility])       Living arrangements for the past 2 months: Assisted Living Facility                                       Social Determinants of Health (SDOH) Interventions    Readmission Risk Interventions     No data to display

## 2022-01-11 NOTE — Progress Notes (Signed)
Progress Note  Patient: Amy Solis TWS:[id number] DOB: 1950-07-24  DOA: 01/10/2022  DOS: 01/11/2022    Brief hospital course: MAKENLY LARABEE is a 71 y.o. female with medical history significant of intellectual disability, vascular dementia, depression, HTN, and HLD who was brought from memory care due to cognitive slowing compared to baseline per staff there. She was found to be hypotensive and appeared dehydrated. There was initial concern for CXR based on opacity in severely hypoventilatory chest xray, though without fever, cough, dyspnea, leukocytosis or procalcitonin elevation, antibiotics have been stopped. She had an AKI that has improved, though not returned to baseline and she's developed hyperkalemia.   Assessment and Plan: Acute metabolic encephalopathy on chronic vascular dementia and intellectual disability: Cognitive slowing appears to have improved with rehydration. She remains at high risk for hospital delirium.  - Tx AKI, dehydration as below to normalize metabolic derangements.  - Delirium precautions - Continue home medications including depakote, gabapentin, paroxetine, risperidone. No doses of alprazolam have been given this month per facility [facility], so not reordering at this time.  AKI: SCr improving with IVF.  - Change to 1/2NS, but continue IVF as creatinine remains above baseline and oral intake is unreliable.  - Continue holding ACEi, meloxicam  Hyperkalemia:  - Lokelma, calcium gluconate, IVF, recheck, remain on telemetry.   Chronic hypoxic respiratory failure: Continue home 2L O2.   HTN: Hypotensive on arrival - Hold norvasc, benazepril for now.  Hyperthyroidism: Clinically euthyroid currently.  - Continue methimazole  GERD:  - PPI  Pneumonia felt to be ruled out: Stop abx.   Subjective: Wants to go home. No dyspnea, cough, chest pain, fever, chills, or other complaints.   Objective: Vitals:   01/10/22 1616 01/10/22 2052 01/11/22 0528 01/11/22 1327   BP: 135/75 (!) 161/90 (!) 148/72 (!) 163/78  Pulse: 69 74 65 73  Resp: 17 19 16 14   Temp: 98.6 F (37 C) (!) 97.5 F (36.4 C) 98.1 F (36.7 C) 98.1 F (36.7 C)  TempSrc: Oral Oral    SpO2: 92% 95% 91% 94%  Weight:      Height:       Gen: 71 y.o. female in no distress Pulm: Nonlabored on supplemental oxygen, clear CV: Regular rate and rhythm. No murmur, rub, or gallop. No JVD, no dependent edema. GI: Abdomen soft, non-tender, non-distended, with normoactive bowel sounds.  Ext: Warm, no deformities Skin: No rashes, lesions or ulcers on visualized skin. Neuro: Alert and interactive, conversant, moving all extremities well, following commands. Psych: Calm.  Data Personally reviewed: CBC: Recent Labs  Lab 01/10/22 0933  WBC 6.3  NEUTROABS 4.0  HGB 12.3  HCT 40.0  MCV 102.0*  PLT 302   Basic Metabolic Panel: Recent Labs  Lab 01/10/22 0933 01/11/22 0824  NA 146* 142  K 5.0 5.4*  CL 114* 110  CO2 26 24  GLUCOSE 86 98  BUN 45* 21  CREATININE 1.70* 0.88  CALCIUM 8.3* 8.8*   GFR: Estimated Creatinine Clearance: 47.3 mL/min (by C-G formula based on SCr of 0.88 mg/dL). Liver Function Tests: Recent Labs  Lab 01/10/22 0933  AST 9*  ALT 9  ALKPHOS 66  BILITOT 0.5  PROT 6.3*  ALBUMIN 3.0*   No results for input(s): "LIPASE", "AMYLASE" in the last 168 hours. No results for input(s): "AMMONIA" in the last 168 hours. Coagulation Profile: Recent Labs  Lab 01/10/22 0933 01/11/22 0412  INR 1.0 0.9   Cardiac Enzymes: No results for input(s): "CKTOTAL", "CKMB", "CKMBINDEX", "TROPONINI"  in the last 168 hours. BNP (last 3 results) Recent Labs    05/05/21 1441  PROBNP 24.0   HbA1C: No results for input(s): "HGBA1C" in the last 72 hours. CBG: Recent Labs  Lab 01/10/22 0943  GLUCAP 78   Lipid Profile: No results for input(s): "CHOL", "HDL", "LDLCALC", "TRIG", "CHOLHDL", "LDLDIRECT" in the last 72 hours. Thyroid Function Tests: No results for input(s):  "TSH", "T4TOTAL", "FREET4", "T3FREE", "THYROIDAB" in the last 72 hours. Anemia Panel: No results for input(s): "VITAMINB12", "FOLATE", "FERRITIN", "TIBC", "IRON", "RETICCTPCT" in the last 72 hours. Urine analysis:    Component Value Date/Time   COLORURINE RED (A) 01/10/2022 0954   APPEARANCEUR TURBID (A) 01/10/2022 0954   LABSPEC 1.018 01/10/2022 0954   PHURINE 7.0 01/10/2022 0954   GLUCOSEU NEGATIVE 01/10/2022 0954   HGBUR MODERATE (A) 01/10/2022 0954   BILIRUBINUR NEGATIVE 01/10/2022 0954   KETONESUR NEGATIVE 01/10/2022 0954   PROTEINUR 100 (A) 01/10/2022 0954   NITRITE NEGATIVE 01/10/2022 0954   LEUKOCYTESUR MODERATE (A) 01/10/2022 0954   Recent Results (from the past 240 hour(s))  Culture, blood (routine x 2)     Status: None (Preliminary result)   Collection Time: 01/10/22  9:33 AM   Specimen: BLOOD  Result Value Ref Range Status   Specimen Description   Final    BLOOD LEFT ANTECUBITAL Performed at [facility], [address] [place], [place] [address]    Special Requests   Final    BOTTLES DRAWN AEROBIC ONLY Blood Culture results may not be optimal due to an inadequate volume of blood received in culture bottles Performed at [facility], [address] [place], [place] [address]    Culture   Final    NO GROWTH < 24 HOURS Performed at [facility], [address] [place], [place] [address]    Report Status PENDING  Incomplete  Culture, blood (routine x 2)     Status: None (Preliminary result)   Collection Time: 01/10/22  9:33 AM   Specimen: BLOOD  Result Value Ref Range Status   Specimen Description   Final    BLOOD BLOOD RIGHT FOREARM Performed at [facility], [address] [place], [place] [address]    Special Requests   Final    BOTTLES DRAWN AEROBIC AND ANAEROBIC Blood Culture results may not be optimal due to an excessive volume of blood received in culture bottles Performed at [facility], [address] [place], [place] [address]    Culture   Final    NO GROWTH < 24 HOURS Performed at [facility], [address] [place], [place] [address]    Report Status PENDING  Incomplete  Resp Panel by RT-PCR (Flu A&B, Covid) Anterior Nasal Swab     Status: None   Collection Time: 01/10/22 10:00 AM   Specimen: Anterior Nasal Swab  Result Value Ref Range Status   SARS Coronavirus 2 by RT PCR NEGATIVE NEGATIVE Final    Comment: (NOTE) SARS-CoV-2 target nucleic acids are NOT DETECTED.  The SARS-CoV-2 RNA is generally detectable in upper respiratory specimens during the acute phase of infection. The lowest concentration of SARS-CoV-2 viral copies this assay can detect is 138 copies/mL. A negative result does not preclude SARS-Cov-2 infection and should not be used as the sole basis for treatment or other patient management decisions. A negative result may occur with  improper specimen collection/handling, submission of specimen other than nasopharyngeal swab, presence of  viral mutation(s) within the areas targeted by this assay, and inadequate number of viral copies(<138 copies/mL). A negative result must be combined with clinical observations, patient history, and epidemiological information. The expected result is Negative.  Fact Sheet for Patients:  BloggerCourse.com  Fact Sheet for Healthcare Providers:  SeriousBroker.it  This test is no t yet approved or cleared by the [place] FDA and  has been authorized for detection and/or diagnosis of SARS-CoV-2 by FDA under an Emergency Use Authorization (EUA). This EUA will remain  in effect (meaning this test can be used) for the duration of the COVID-19 declaration under Section 564(b)(1) of the Act, 21 U.S.C.section 360bbb-3(b)(1), unless the authorization is terminated  or revoked sooner.       Influenza A by PCR NEGATIVE NEGATIVE Final    Influenza B by PCR NEGATIVE NEGATIVE Final    Comment: (NOTE) The Xpert Xpress SARS-CoV-2/FLU/RSV plus assay is intended as an aid in the diagnosis of influenza from Nasopharyngeal swab specimens and should not be used as a sole basis for treatment. Nasal washings and aspirates are unacceptable for Xpert Xpress SARS-CoV-2/FLU/RSV testing.  Fact Sheet for Patients: BloggerCourse.com  Fact Sheet for Healthcare Providers: SeriousBroker.it  This test is not yet approved or cleared by the [place] FDA and has been authorized for detection and/or diagnosis of SARS-CoV-2 by FDA under an Emergency Use Authorization (EUA). This EUA will remain in effect (meaning this test can be used) for the duration of the COVID-19 declaration under Section 564(b)(1) of the Act, 21 U.S.C. section 360bbb-3(b)(1), unless the authorization is terminated or revoked.  Performed at [facility], [address], [place], [place] [address]   MRSA Next Gen by PCR, Nasal     Status: None   Collection Time: 01/11/22  4:12 AM  Result Value Ref Range Status   MRSA by PCR Next Gen NOT DETECTED NOT DETECTED Final    Comment: (NOTE) The GeneXpert MRSA Assay (FDA approved for NASAL specimens only), is one component of a comprehensive MRSA colonization surveillance program. It is not intended to diagnose MRSA infection nor to guide or monitor treatment for MRSA infections. Test performance is not FDA approved in patients less than 46 years old. Performed at [facility], [address] [place], [place] [address]      US RENAL  Result Date: 01/10/2022 CLINICAL DATA:  Acute kidney injury EXAM: RENAL / URINARY TRACT ULTRASOUND COMPLETE COMPARISON:  None Available. FINDINGS: Right Kidney: Renal measurements: 10.8 x 5.6 x 4.4 cm = volume: 137.4 mL. Echogenicity within normal limits. No hydronephrosis. Cyst at the midpole measuring  8 mm, no follow-up imaging is recommended Left Kidney: Renal measurements: 9.8 x 4.5 x 4 cm = volume: 91.9 mL. Echogenicity within normal limits. No hydronephrosis. Cyst at the lower pole measuring 9 mm, no follow-up imaging is recommended. Bladder: Appears normal for degree of bladder distention. Mild debris in the bladder. Other: None. IMPRESSION: 1. Negative for hydronephrosis. 2. Small amount of debris in the bladder Electronically Signed   By: Jasmine Pang M.D.   On: 01/10/2022 19:50   CT Head Wo Contrast  Result Date: 01/10/2022 CLINICAL DATA:  Mental status change, unknown cause EXAM: CT HEAD WITHOUT CONTRAST TECHNIQUE: Contiguous axial images were obtained from the base of the skull through the vertex without intravenous contrast. RADIATION DOSE REDUCTION: This exam was performed according to the departmental dose-optimization program which includes automated exposure control, adjustment of the mA and/or kV according to patient  size and/or use of iterative reconstruction technique. COMPARISON:  Head CT 10/20/2021 FINDINGS: Brain: Agenesis of the corpus callosum. Unchanged size of the ventricular system. There is unchanged hypoattenuation and encephalomalacia within the left frontal lobe.No acute intracranial hemorrhage. Unchanged bilateral cerebellar infarcts.No extra-axial collection. No significant midline shift. Basal cisterns are patent. Vascular: No hyperdense vessel or unexpected calcification. Skull: Negative Sinuses/Orbits: Mild paranasal sinus mucosal thickening most from in the left maxillary sinus. Orbits are unremarkable. Other: None. IMPRESSION: No acute intracranial abnormality. Unchanged hypoattenuation and encephalomalacia within the left frontal lobe compatible with previous infarct. Agenesis of the corpus callosum. Unchanged size of the ventricular system. Electronically Signed   By: Caprice Renshaw M.D.   On: 01/10/2022 12:24   DG Chest Port 1 View  Result Date: 01/10/2022 CLINICAL  DATA:  Weakness EXAM: PORTABLE CHEST 1 VIEW COMPARISON:  10/26/2021 FINDINGS: Stable heart size. Very low lung volumes. Patchy airspace opacity throughout the left lung. No large pleural fluid collection. No pneumothorax. IMPRESSION: Very low lung volumes. Patchy airspace opacity throughout the left lung may represent atelectasis or pneumonia. Electronically Signed   By: Duanne Guess D.O.   On: 01/10/2022 10:26    Family Communication: None at bedside  Disposition: Status is: Inpatient Remains inpatient appropriate because: Continue IVF for AKI and treatment for hyperkalemia.  Planned Discharge Destination:  Return to memory care at [facility], likely 6/29.  Tyrone Nine, MD 01/11/2022 3:43 PM Page by [email or web address]

## 2022-01-12 DIAGNOSIS — J9611 Chronic respiratory failure with hypoxia: Secondary | ICD-10-CM | POA: Diagnosis not present

## 2022-01-12 DIAGNOSIS — N179 Acute kidney failure, unspecified: Secondary | ICD-10-CM | POA: Diagnosis not present

## 2022-01-12 DIAGNOSIS — J449 Chronic obstructive pulmonary disease, unspecified: Secondary | ICD-10-CM | POA: Diagnosis not present

## 2022-01-12 DIAGNOSIS — G9341 Metabolic encephalopathy: Secondary | ICD-10-CM | POA: Diagnosis not present

## 2022-01-12 LAB — BASIC METABOLIC PANEL
Anion gap: 7 (ref 5–15)
BUN: 16 mg/dL (ref 8–23)
CO2: 30 mmol/L (ref 22–32)
Calcium: 8.9 mg/dL (ref 8.9–10.3)
Chloride: 107 mmol/L (ref 98–111)
Creatinine, Ser: 0.81 mg/dL (ref 0.44–1.00)
GFR, Estimated: >60 mL/min (ref >60)
Glucose, Bld: 104 mg/dL — ABNORMAL HIGH (ref 70–99)
Potassium: 3.5 mmol/L (ref 3.5–5.1)
Sodium: 144 mmol/L (ref 135–145)

## 2022-01-12 LAB — LEGIONELLA PNEUMOPHILA SEROGP 1 UR AG: L. pneumophila Serogp 1 Ur Ag: POSITIVE — AB

## 2022-01-12 MED ORDER — MELOXICAM 15 MG PO TABS: 15.0000 mg | ORAL_TABLET | Freq: Every day | ORAL | Status: DC | PRN

## 2022-01-12 NOTE — NC FL2 (Signed)
[place] MEDICAID FL2 LEVEL OF CARE SCREENING TOOL     IDENTIFICATION  Patient Name: Amy Solis Birthdate: [birth year] Sex: female Admission Date (Current Location): 01/10/2022  [facility] and [place] Number:  Producer, television/film/video and Address:  [facility],  [address] [place], [place] [address]      Provider Number: [id number]  Attending Physician Name and Address:  Tyrone Nine, MD  Relative Name and Phone Number:       Current Level of Care: Hospital Recommended Level of Care: Memory Care Prior Approval Number:    Date Approved/Denied:   PASRR Number: [id number]  Discharge Plan: Other (Comment) (assisted living facility)    Current Diagnoses: Patient Active Problem List   Diagnosis Date Noted   AKI (acute kidney injury) (HCC) 01/10/2022   Acute metabolic encephalopathy 01/10/2022   Pneumonia 10/26/2021   DNR (do not resuscitate) 10/26/2021   Hyperthyroidism 10/22/2021   Vascular dementia (HCC) 10/22/2021   COPD (chronic obstructive pulmonary disease) (HCC) 10/22/2021   Chronic respiratory failure with hypoxia (HCC) 10/22/2021   Essential hypertension 10/22/2021   Hypokalemia 10/22/2021   Toxic encephalopathy 10/21/2021   Acute encephalopathy 10/20/2021   AMS (altered mental status) 10/20/2021   Benzodiazepine causing adverse effect in therapeutic use 02/15/2019   Hypoxemic encephalopathy (HCC) 02/15/2019   Diastolic dysfunction 06/06/2016   Nocturnal hypoxemia 05/09/2016   Coronary artery calcification 05/09/2016   Family history of early CAD 05/09/2016   Dyspnea 03/09/2016    Orientation RESPIRATION BLADDER Height & Weight     Self, Time, Situation, Place  Normal Continent Weight: 61.6 kg Height:  5' (152.4 cm)  BEHAVIORAL SYMPTOMS/MOOD NEUROLOGICAL BOWEL NUTRITION STATUS      Continent Diet (regular)  AMBULATORY STATUS COMMUNICATION OF NEEDS Skin   Limited Assist Verbally Normal                       Personal Care Assistance  Level of Assistance  Bathing, Feeding, Dressing Bathing Assistance: Limited assistance Feeding assistance: Limited assistance Dressing Assistance: Limited assistance     Functional Limitations Info  Sight, Hearing, Speech Sight Info: Adequate Hearing Info: Adequate Speech Info: Adequate    SPECIAL CARE FACTORS FREQUENCY                       Contractures Contractures Info: Not present    Additional Factors Info  Code Status Code Status Info: DNR             Current Medications (01/12/2022):  This is the current hospital active medication list Current Facility-Administered Medications  Medication Dose Route Frequency Provider Last Rate Last Admin   0.45 % sodium chloride infusion   Intravenous Continuous Tyrone Nine, MD 75 mL/hr at 01/12/22 0905 New Bag at 01/12/22 0905   acetaminophen (TYLENOL) tablet 650 mg  650 mg Oral Q6H PRN Margie Ege A, DO       Or   acetaminophen (TYLENOL) suppository 650 mg  650 mg Rectal Q6H PRN Ronaldo Miyamoto, Tyrone A, DO       aspirin EC tablet 81 mg  81 mg Oral Daily Kyle, Tyrone A, DO   81 mg at 01/12/22 0903   divalproex (DEPAKOTE) DR tablet 250 mg  250 mg Oral BID Ronaldo Miyamoto, Tyrone A, DO   250 mg at 01/12/22 0904   enoxaparin (LOVENOX) injection 40 mg  40 mg Subcutaneous Q24H Tyrone Nine, MD   40 mg at 01/11/22 1805   fluticasone (  FLONASE) 50 MCG/ACT nasal spray 2 spray  2 spray Each Nare Daily Kyle, Tyrone A, DO   2 spray at 01/12/22 0910   gabapentin (NEURONTIN) capsule 200 mg  200 mg Oral QHS Kyle, Tyrone A, DO   200 mg at 01/11/22 2234   guaiFENesin (ROBITUSSIN) 100 MG/5ML liquid 5 mL  5 mL Oral Q4H PRN Ronaldo Miyamoto, Tyrone A, DO       ipratropium-albuterol (DUONEB) 0.5-2.5 (3) MG/3ML nebulizer solution 3 mL  3 mL Nebulization Q6H PRN Ronaldo Miyamoto, Tyrone A, DO       methimazole (TAPAZOLE) tablet 5 mg  5 mg Oral Daily Kyle, Tyrone A, DO   5 mg at 01/12/22 0904   ondansetron (ZOFRAN) tablet 4 mg  4 mg Oral Q6H PRN Ronaldo Miyamoto, Tyrone A, DO       Or   ondansetron  (ZOFRAN) injection 4 mg  4 mg Intravenous Q6H PRN Ronaldo Miyamoto, Tyrone A, DO       oxybutynin (DITROPAN-XL) 24 hr tablet 10 mg  10 mg Oral QHS Kyle, Tyrone A, DO   10 mg at 01/11/22 2234   pantoprazole (PROTONIX) EC tablet 40 mg  40 mg Oral Daily Kyle, Tyrone A, DO   40 mg at 01/12/22 2637   PARoxetine (PAXIL) tablet 30 mg  30 mg Oral Daily Kyle, Tyrone A, DO   30 mg at 01/12/22 8588   risperiDONE (RISPERDAL) tablet 0.5 mg  0.5 mg Oral BID Ronaldo Miyamoto, Tyrone A, DO   0.5 mg at 01/12/22 5027   risperiDONE (RISPERDAL) tablet 1 mg  1 mg Oral QHS Kyle, Tyrone A, DO   1 mg at 01/11/22 2234   sodium chloride (MURO 128) 5 % ophthalmic solution 1 drop  1 drop Both Eyes TID Margie Ege A, DO   1 drop at 01/12/22 7412     Discharge Medications: Please see discharge summary for a list of discharge medications.  Relevant Imaging Results:  Relevant Lab Results:   Additional Information SS#: [id number]  Golda Acre, RN

## 2022-01-12 NOTE — Plan of Care (Signed)

## 2022-01-12 NOTE — Discharge Summary (Signed)
Physician Discharge Summary   Patient: Amy Solis MRN: [id number] DOB: 08-25-1950  Admit date:     01/10/2022  Discharge date: 01/12/22  Discharge Physician: Patrecia Pour   PCP: Reymundo Poll, MD   Recommendations at discharge: Suggest recheck BMP in next week.  Discharge Diagnoses: Principal Problem:   AKI (acute kidney injury) ([place]) Active Problems:   Hyperthyroidism   Vascular dementia ([place])   Chronic respiratory failure with hypoxia ([place])   Essential hypertension   COPD (chronic obstructive pulmonary disease) ([place])   Pneumonia   Acute metabolic encephalopathy  Hospital Course: [name] is a 71 y.o. female with medical history significant of intellectual disability, vascular dementia, depression, HTN, and HLD who was brought from memory care due to cognitive slowing compared to baseline per staff there. She was found to be hypotensive and appeared dehydrated. There was initial concern for pneumonia based on opacity in severely hypoventilatory chest xray, though without fever, cough, dyspnea, leukocytosis or procalcitonin elevation, antibiotics have been stopped. She had an AKI that has improved, ultimately returning to baseline with transient hyperkalemia that has resolved. Her mental status appears stable at reported baseline.  Assessment and Plan: Acute metabolic encephalopathy on chronic vascular dementia and intellectual disability: Cognitive slowing appears to have improved with rehydration.  - Continue home medications including depakote, gabapentin, paroxetine, risperidone. No doses of alprazolam have been given this month per facility [facility], so not reordering at this time.   AKI: SCr improving with IVF. Down to 0.88 on day of discharge.  - Suggest limiting NSAID and monitoring BMP in the next week.   Hyperkalemia: Resolved.      Chronic hypoxic respiratory failure: Continue home 2L O2.    HTN: Hypotensive on arrival - Restart home medications now that BP is  rising again and clinically euvolemic, taking normal po.   Hyperthyroidism: Clinically euthyroid currently.  - Continue methimazole   GERD:  - PPI   Pneumonia felt to be ruled out: Stopped abx.   Consultants: None Procedures performed: None  Disposition: Long term care facility, memory care Diet recommendation:  Cardiac diet DISCHARGE MEDICATION: Allergies as of 01/12/2022   No Known Allergies      Medication List     STOP taking these medications    ALPRAZolam 0.25 MG tablet Commonly known as: XANAX   potassium chloride SA 20 MEQ tablet Commonly known as: KLOR-CON M       TAKE these medications    acetaminophen 325 MG tablet Commonly known as: TYLENOL Take 650 mg by mouth every 6 (six) hours as needed (elevated temperature, pain).   amLODipine 5 MG tablet Commonly known as: NORVASC Take 5 mg by mouth daily.   ammonium lactate 12 % lotion Commonly known as: LAC-HYDRIN Apply 1 application. topically at bedtime. To bilateral legs and feet   aspirin 81 MG tablet Take 81 mg by mouth daily.   benazepril 20 MG tablet Commonly known as: LOTENSIN Take 20 mg by mouth at bedtime.   chlorhexidine 0.12 % solution Commonly known as: PERIDEX Use as directed 15 mLs in the mouth or throat 2 (two) times daily.   divalproex 250 MG DR tablet Commonly known as: DEPAKOTE Take 250 mg by mouth 2 (two) times daily.   fluticasone 50 MCG/ACT nasal spray Commonly known as: FLONASE Place 2 sprays into both nostrils in the morning.   gabapentin 100 MG capsule Commonly known as: NEURONTIN Take 200 mg by mouth at bedtime.   meloxicam 15 MG tablet  Commonly known as: MOBIC Take 1 tablet (15 mg total) by mouth daily as needed for pain. What changed:  when to take this reasons to take this   methimazole 5 MG tablet Commonly known as: TAPAZOLE Take 5 mg by mouth daily.   multivitamin with minerals Tabs tablet Take 1 tablet by mouth daily.   omeprazole 20 MG  capsule Commonly known as: PRILOSEC Take 20 mg by mouth daily.   oxybutynin 10 MG 24 hr tablet Commonly known as: DITROPAN-XL Take 10 mg by mouth at bedtime.   PARoxetine 30 MG tablet Commonly known as: PAXIL Take 30 mg by mouth daily.   risperiDONE 1 MG tablet Commonly known as: RISPERDAL Take 1 mg by mouth at bedtime.   risperiDONE 0.5 MG tablet Commonly known as: RISPERDAL Take 0.5 mg by mouth 2 (two) times daily.   sodium chloride 1 g tablet Take 1 g by mouth every Monday.   sodium chloride 5 % ophthalmic solution Commonly known as: MURO 128 Place 1 drop into both eyes 3 (three) times daily.        Follow-up Information     Florentina Jenny, MD Follow up.   Specialty: Family Medicine Contact information: [address] [place] [address] [phone]         Nahser, Deloris Ping, MD .   Specialty: Cardiology Contact information: [address] [place] [address] [phone]                Discharge Exam: [name] Weights   01/10/22 1530  Weight: 61.6 kg  BP (!) 167/96   Pulse 92   Temp 98 F (36.7 C)   Resp 18   Ht 5' (1.524 m)   Wt 61.6 kg   SpO2 (!) 89%   BMI 26.52 kg/m   No distress, well-appearing Pleasant, interactive, no focal deficits RRR Clear, nonlabored on 2L O2  Condition at discharge: stable  The results of significant diagnostics from this hospitalization (including imaging, microbiology, ancillary and laboratory) are listed below for reference.   Imaging Studies: US RENAL  Result Date: 01/10/2022 CLINICAL DATA:  Acute kidney injury EXAM: RENAL / URINARY TRACT ULTRASOUND COMPLETE COMPARISON:  None Available. FINDINGS: Right Kidney: Renal measurements: 10.8 x 5.6 x 4.4 cm = volume: 137.4 mL. Echogenicity within normal limits. No hydronephrosis. Cyst at the midpole measuring 8 mm, no follow-up imaging is recommended Left Kidney: Renal measurements: 9.8 x 4.5 x 4 cm = volume: 91.9 mL.  Echogenicity within normal limits. No hydronephrosis. Cyst at the lower pole measuring 9 mm, no follow-up imaging is recommended. Bladder: Appears normal for degree of bladder distention. Mild debris in the bladder. Other: None. IMPRESSION: 1. Negative for hydronephrosis. 2. Small amount of debris in the bladder Electronically Signed   By: Jasmine Pang M.D.   On: 01/10/2022 19:50   CT Head Wo Contrast  Result Date: 01/10/2022 CLINICAL DATA:  Mental status change, unknown cause EXAM: CT HEAD WITHOUT CONTRAST TECHNIQUE: Contiguous axial images were obtained from the base of the skull through the vertex without intravenous contrast. RADIATION DOSE REDUCTION: This exam was performed according to the departmental dose-optimization program which includes automated exposure control, adjustment of the mA and/or kV according to patient size and/or use of iterative reconstruction technique. COMPARISON:  Head CT 10/20/2021 FINDINGS: Brain: Agenesis of the corpus callosum. Unchanged size of the ventricular system. There is unchanged hypoattenuation and encephalomalacia within the left frontal lobe.No acute intracranial hemorrhage. Unchanged bilateral cerebellar  infarcts.No extra-axial collection. No significant midline shift. Basal cisterns are patent. Vascular: No hyperdense vessel or unexpected calcification. Skull: Negative Sinuses/Orbits: Mild paranasal sinus mucosal thickening most from in the left maxillary sinus. Orbits are unremarkable. Other: None. IMPRESSION: No acute intracranial abnormality. Unchanged hypoattenuation and encephalomalacia within the left frontal lobe compatible with previous infarct. Agenesis of the corpus callosum. Unchanged size of the ventricular system. Electronically Signed   By: Maurine Simmering M.D.   On: 01/10/2022 12:24   DG Chest Port 1 View  Result Date: 01/10/2022 CLINICAL DATA:  Weakness EXAM: PORTABLE CHEST 1 VIEW COMPARISON:  10/26/2021 FINDINGS: Stable heart size. Very low lung  volumes. Patchy airspace opacity throughout the left lung. No large pleural fluid collection. No pneumothorax. IMPRESSION: Very low lung volumes. Patchy airspace opacity throughout the left lung may represent atelectasis or pneumonia. Electronically Signed   By: Davina Poke D.O.   On: 01/10/2022 10:26   MM DIAG BREAST TOMO UNI RIGHT  Result Date: 12/15/2021 CLINICAL DATA:  Patient returns today to evaluate a possible RIGHT breast distortion questioned on recent screening mammogram. EXAM: DIGITAL DIAGNOSTIC UNILATERAL RIGHT MAMMOGRAM WITH TOMOSYNTHESIS AND CAD TECHNIQUE: Right digital diagnostic mammography and breast tomosynthesis was performed. The images were evaluated with computer-aided detection. COMPARISON:  Previous exams including recent screening mammogram dated 12/01/2021. ACR Breast Density Category b: There are scattered areas of fibroglandular density. FINDINGS: On today's additional diagnostic views, including spot compression with 3D tomosynthesis, there is no persistent distortion within the RIGHT breast and the overall fibroglandular pattern of the RIGHT breast on today's exam is stable compared to earlier mammograms from 2018 and 2019 confirming benignity. IMPRESSION: No evidence of malignancy. Patient may return to routine annual bilateral screening mammogram schedule. RECOMMENDATION: Screening mammogram in one year.(Code:[id number]-B-01Y) I have discussed the findings and recommendations with the patient and her caregiver. If applicable, a reminder letter will be sent to the patient regarding the next appointment. BI-RADS CATEGORY  1: Negative. Electronically Signed   By: Franki Cabot M.D.   On: 12/15/2021 11:46   Microbiology: Results for orders placed or performed during the hospital encounter of 01/10/22  Culture, blood (routine x 2)     Status: None (Preliminary result)   Collection Time: 01/10/22  9:33 AM   Specimen: BLOOD  Result Value Ref Range Status   Specimen Description    Final    BLOOD LEFT ANTECUBITAL Performed at [facility] [address] [place], [place] [address]    Special Requests   Final    BOTTLES DRAWN AEROBIC ONLY Blood Culture results may not be optimal due to an inadequate volume of blood received in culture bottles Performed at [facility] [address], [place], [place] [address]    Culture   Final    NO GROWTH 2 DAYS Performed at [facility] [address], [place], [place] [address]    Report Status PENDING  Incomplete  Culture, blood (routine x 2)     Status: None (Preliminary result)   Collection Time: 01/10/22  9:33 AM   Specimen: BLOOD  Result Value Ref Range Status   Specimen Description   Final    BLOOD BLOOD RIGHT FOREARM Performed at [facility] [address] [place], [place] [address]    Special Requests   Final    BOTTLES DRAWN AEROBIC AND ANAEROBIC Blood Culture results may not be optimal due to an excessive volume of blood received in culture bottles Performed at  [facility], [address] [place], [place] [address]    Culture   Final    NO GROWTH 2 DAYS Performed at [facility], [address], [place], [place] [address]    Report Status PENDING  Incomplete  Resp Panel by RT-PCR (Flu A&B, Covid) Anterior Nasal Swab     Status: None   Collection Time: 01/10/22 10:00 AM   Specimen: Anterior Nasal Swab  Result Value Ref Range Status   SARS Coronavirus 2 by RT PCR NEGATIVE NEGATIVE Final    Comment: (NOTE) SARS-CoV-2 target nucleic acids are NOT DETECTED.  The SARS-CoV-2 RNA is generally detectable in upper respiratory specimens during the acute phase of infection. The lowest concentration of SARS-CoV-2 viral copies this assay can detect is 138 copies/mL. A negative result does not preclude SARS-Cov-2 infection and should not be used as the sole basis for treatment or other patient management decisions. A  negative result may occur with  improper specimen collection/handling, submission of specimen other than nasopharyngeal swab, presence of viral mutation(s) within the areas targeted by this assay, and inadequate number of viral copies(<138 copies/mL). A negative result must be combined with clinical observations, patient history, and epidemiological information. The expected result is Negative.  Fact Sheet for Patients:  BloggerCourse.com  Fact Sheet for Healthcare Providers:  SeriousBroker.it  This test is no t yet approved or cleared by the [place] FDA and  has been authorized for detection and/or diagnosis of SARS-CoV-2 by FDA under an Emergency Use Authorization (EUA). This EUA will remain  in effect (meaning this test can be used) for the duration of the COVID-19 declaration under Section 564(b)(1) of the Act, 21 U.S.C.section 360bbb-3(b)(1), unless the authorization is terminated  or revoked sooner.       Influenza A by PCR NEGATIVE NEGATIVE Final   Influenza B by PCR NEGATIVE NEGATIVE Final    Comment: (NOTE) The Xpert Xpress SARS-CoV-2/FLU/RSV plus assay is intended as an aid in the diagnosis of influenza from Nasopharyngeal swab specimens and should not be used as a sole basis for treatment. Nasal washings and aspirates are unacceptable for Xpert Xpress SARS-CoV-2/FLU/RSV testing.  Fact Sheet for Patients: BloggerCourse.com  Fact Sheet for Healthcare Providers: SeriousBroker.it  This test is not yet approved or cleared by the [place] FDA and has been authorized for detection and/or diagnosis of SARS-CoV-2 by FDA under an Emergency Use Authorization (EUA). This EUA will remain in effect (meaning this test can be used) for the duration of the COVID-19 declaration under Section 564(b)(1) of the Act, 21 U.S.C. section 360bbb-3(b)(1), unless the authorization  is terminated or revoked.  Performed at [facility], [address], [place], [place] [address]   MRSA Next Gen by PCR, Nasal     Status: None   Collection Time: 01/11/22  4:12 AM  Result Value Ref Range Status   MRSA by PCR Next Gen NOT DETECTED NOT DETECTED Final    Comment: (NOTE) The GeneXpert MRSA Assay (FDA approved for NASAL specimens only), is one component of a comprehensive MRSA colonization surveillance program. It is not intended to diagnose MRSA infection nor to guide or monitor treatment for MRSA infections. Test performance is not FDA approved in patients less than 51 years old. Performed at [facility], [address], [place], [place] [address]     Labs: CBC: Recent Labs  Lab 01/10/22 0933  WBC 6.3  NEUTROABS 4.0  HGB 12.3  HCT 40.0  MCV 102.0*  PLT 302   Basic Metabolic Panel: Recent Labs  Lab 01/10/22 0933 01/11/22 0824 01/12/22 0357  NA 146* 142 144  K 5.0 5.4* 3.5  CL 114* 110 107  CO2 26 24 30   GLUCOSE 86 98 104*  BUN 45* 21 16  CREATININE 1.70* 0.88 0.81  CALCIUM 8.3* 8.8* 8.9   Liver Function Tests: Recent Labs  Lab 01/10/22 0933  AST 9*  ALT 9  ALKPHOS 66  BILITOT 0.5  PROT 6.3*  ALBUMIN 3.0*   CBG: Recent Labs  Lab 01/10/22 0943  GLUCAP 78    Discharge time spent: greater than 30 minutes.  Signed: 01/12/22, MD [facility] 01/12/2022

## 2022-01-12 NOTE — TOC Transition Note (Addendum)
Transition of Care [facility]) - CM/SW Discharge Note   Patient Details  Name: ACHSAH MCQUADE MRN: [id number] Date of Birth: 1951-04-11  [facility]) CM/SW Contact:  Golda Acre, RN Phone Number: 01/12/2022, 9:39 AM   Clinical Narrative:    Jetta Lout to see how patient can be transported back to [facility]. Tct-Rebecca Alberteen Spindle pt will hve to back via ptar Dc summary and fl2 faxed to [facility] at [phone] Ptar called for transport at 1122. Rn notified of this.  Final next level of care: Assisted Living (Memory Care at [facility]) Barriers to Discharge: No Barriers Identified   Patient Goals and CMS Choice Patient states their goals for this hospitalization and ongoing recovery are:: Oriented to self CMS Medicare.gov Compare Post Acute Care list provided to:: Legal Guardian Choice offered to / list presented to : [facility] POA / Guardian  Discharge Placement                       Discharge Plan and Services                                     Social Determinants of Health (SDOH) Interventions     Readmission Risk Interventions     No data to display

## 2022-01-15 LAB — CULTURE, BLOOD (ROUTINE X 2)
Culture: NO GROWTH
Culture: NO GROWTH

## 2022-05-02 ENCOUNTER — Ambulatory Visit (INDEPENDENT_AMBULATORY_CARE_PROVIDER_SITE_OTHER): Admitting: Student

## 2022-05-02 ENCOUNTER — Encounter: Payer: Self-pay | Admitting: Student

## 2022-05-02 VITALS — BP 124/76 | HR 70 | Temp 97.7°F

## 2022-05-02 DIAGNOSIS — J9611 Chronic respiratory failure with hypoxia: Secondary | ICD-10-CM

## 2022-05-02 NOTE — Patient Instructions (Signed)
-   See you in a year or sooner if need be!

## 2022-05-02 NOTE — Progress Notes (Signed)
Synopsis: Referred for chronic hypoxemic respiratory failure by Reymundo Poll, MD  Subjective:   PATIENT ID: Amy Solis GENDER: female DOB: September 21, 1950, MRN: [id number]  Chief Complaint  Patient presents with   Follow-up    Breathing is doing well and no new co's today.    70yF never smoker with chronic hypoxic respiratory failure due to diastolic dysfunction, R hemidiaphragm elevation, mild cognitive impairment  Last seen in our clinic in 2019 by Amy Solis at which point she was set up for POC. She says she's feeling fine. No dyspnea at rest. Does have some DOE - maybe worse over last year per caregiver Amy Solis. She is pretty much wheelchair dependent. Can transfer on her own. Never got infected with covid to her knowledge  She remains on O2 2L continuously.   Denies feeling excessively sleepy during the day although caregiver wonders if she may be sleepy during the day. Snores. No PND.   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  Interval HPI: Stays in wheelchair these days as at last visit. No noticeable DOE. She has occasional cough but it is not bothersome.   She has had several admissions since last visit: 10/24/21 - UTI, acute metabolic encephalopathy 3/76/28 - multifocal pneumonia, acute metabolic encephalopathy 09/29/15 - acute metabolic encephalopathy  BNP normal, BMP normal last visit  Otherwise pertinent review of systems is negative.  Past Medical History:  Diagnosis Date   Depressive disorder    Hyperlipemia    Hypertension    Mild mental retardation    Osteoporosis      Family History  Problem Relation Age of Onset   Breast cancer Mother        diagnosed in her 63's   Heart attack Father    Heart attack Brother      Past Surgical History:  Procedure Laterality Date   ABDOMINAL HYSTERECTOMY      Social History   Socioeconomic History   Marital status: Single    Spouse name: Not on file   Number of children: Not  on file   Years of education: Not on file   Highest education level: Not on file  Occupational History   Not on file  Tobacco Use   Smoking status: Never   Smokeless tobacco: Never  Vaping Use   Vaping Use: Never used  Substance and Sexual Activity   Alcohol use: No   Drug use: No   Sexual activity: Not on file  Other Topics Concern   Not on file  Social History Narrative   Lives in independent living facility   Social Determinants of Health   Financial Resource Strain: Not on file  Food Insecurity: Not on file  Transportation Needs: Not on file  Physical Activity: Not on file  Stress: Not on file  Social Connections: Not on file  Intimate Partner Violence: Not on file     No Known Allergies   Outpatient Medications Prior to Visit  Medication Sig Dispense Refill   acetaminophen (TYLENOL) 325 MG tablet Take 650 mg by mouth every 6 (six) hours as needed (elevated temperature, pain).     amLODipine (NORVASC) 5 MG tablet Take 5 mg by mouth daily.     ammonium lactate (LAC-HYDRIN) 12 % lotion Apply 1 application. topically at bedtime. To bilateral legs and feet     aspirin 81 MG tablet Take 81 mg by mouth daily.     benazepril (LOTENSIN) 20 MG tablet Take 20 mg by  mouth at bedtime.     chlorhexidine (PERIDEX) 0.12 % solution Use as directed 15 mLs in the mouth or throat 2 (two) times daily.     divalproex (DEPAKOTE) 250 MG DR tablet Take 250 mg by mouth 2 (two) times daily.     fluticasone (FLONASE) 50 MCG/ACT nasal spray Place 2 sprays into both nostrils in the morning.     gabapentin (NEURONTIN) 100 MG capsule Take 200 mg by mouth at bedtime.     meloxicam (MOBIC) 15 MG tablet Take 1 tablet (15 mg total) by mouth daily as needed for pain.     methimazole (TAPAZOLE) 5 MG tablet Take 5 mg by mouth daily.     Multiple Vitamin (MULTIVITAMIN WITH MINERALS) TABS tablet Take 1 tablet by mouth daily.     omeprazole (PRILOSEC) 20 MG capsule Take 20 mg by mouth daily.     oxybutynin  (DITROPAN-XL) 10 MG 24 hr tablet Take 10 mg by mouth at bedtime.     PARoxetine (PAXIL) 30 MG tablet Take 30 mg by mouth daily.     risperiDONE (RISPERDAL) 0.5 MG tablet Take 0.5 mg by mouth 2 (two) times daily.     risperiDONE (RISPERDAL) 1 MG tablet Take 1 mg by mouth at bedtime.     sodium chloride (MURO 128) 5 % ophthalmic solution Place 1 drop into both eyes 3 (three) times daily.     sodium chloride 1 g tablet Take 1 g by mouth every Monday.     No facility-administered medications prior to visit.       Objective:   Physical Exam:  General appearance: 71 y.o., female, NAD, a little drowsy Eyes: anicteric sclerae; PERRL, tracking appropriately HENT: NCAT; MMM Neck: Trachea midline; no lymphadenopathy, no JVD Lungs: diminished r base, with normal respiratory effort CV: RRR, no murmur  Abdomen: Soft, non-tender; non-distended, BS present  Extremities: No peripheral edema, warm Skin: Normal turgor and texture; no rash Psych: Appropriate affect Neuro: Alert and oriented to person and place, no focal deficit     Vitals:   05/02/22 1323  BP: 124/76  Pulse: 70  Temp: 97.7 F (36.5 C)  TempSrc: Oral  SpO2: 92%   92% on 2 LPM  BMI Readings from Last 3 Encounters:  01/10/22 26.52 kg/m  08/21/17 26.08 kg/m  07/31/17 27.73 kg/m   Wt Readings from Last 3 Encounters:  01/10/22 135 lb 12.9 oz (61.6 kg)  08/21/17 142 lb 9.6 oz (64.7 kg)  07/31/17 142 lb (64.4 kg)     CBC    Component Value Date/Time   WBC 6.3 01/10/2022 0933   RBC 3.92 01/10/2022 0933   HGB 12.3 01/10/2022 0933   HCT 40.0 01/10/2022 0933   PLT 302 01/10/2022 0933   MCV 102.0 (H) 01/10/2022 0933   MCH 31.4 01/10/2022 0933   MCHC 30.8 01/10/2022 0933   RDW 15.7 (H) 01/10/2022 0933   LYMPHSABS 1.0 01/10/2022 0933   MONOABS 1.0 01/10/2022 0933   EOSABS 0.2 01/10/2022 0933   BASOSABS 0.0 01/10/2022 0933    Chest Imaging: HRCT Chest reviewed by me significant for R hemidiaphragm elevation and  no evidence of ILD  Pulmonary Functions Testing Results:     No data to display            Echocardiogram:   TTE 04/2016: G1DD      Assessment & Plan:   # chronic hypoxemic respiratory failure # right hemidiaphragmatic paralysis No particular issues she wishes to address in this  regard today.   Plan: - continue O2 2L per Ashley continuous - don't think she'd tolerate PFTs well or perform them well in setting of her debility/cognitive impairment - doesn't sound like she would ever tolerate wearing PAP mask, no need for sleep study   RTC 1 year   Amy Hurter, MD [facility] 05/02/2022 1:34 PM

## 2022-07-25 ENCOUNTER — Other Ambulatory Visit: Payer: Self-pay | Admitting: Student

## 2022-07-25 DIAGNOSIS — Z1231 Encounter for screening mammogram for malignant neoplasm of breast: Secondary | ICD-10-CM

## 2022-08-03 IMAGING — MG MM DIGITAL DIAGNOSTIC UNILAT*R* W/ TOMO W/ CAD
4 series · 4 of 12 positions shown · non-contrast
Comparison: Previous exams including recent screening mammogram
dated 12/01/2021.

CLINICAL DATA: Patient returns today to evaluate a possible RIGHT
breast distortion questioned on recent screening mammogram.

EXAM:
DIGITAL DIAGNOSTIC UNILATERAL RIGHT MAMMOGRAM WITH TOMOSYNTHESIS AND
CAD
TECHNIQUE: Right digital diagnostic mammography and breast tomosynthesis was
performed. The images were evaluated with computer-aided detection.

[R ML synth-2D]
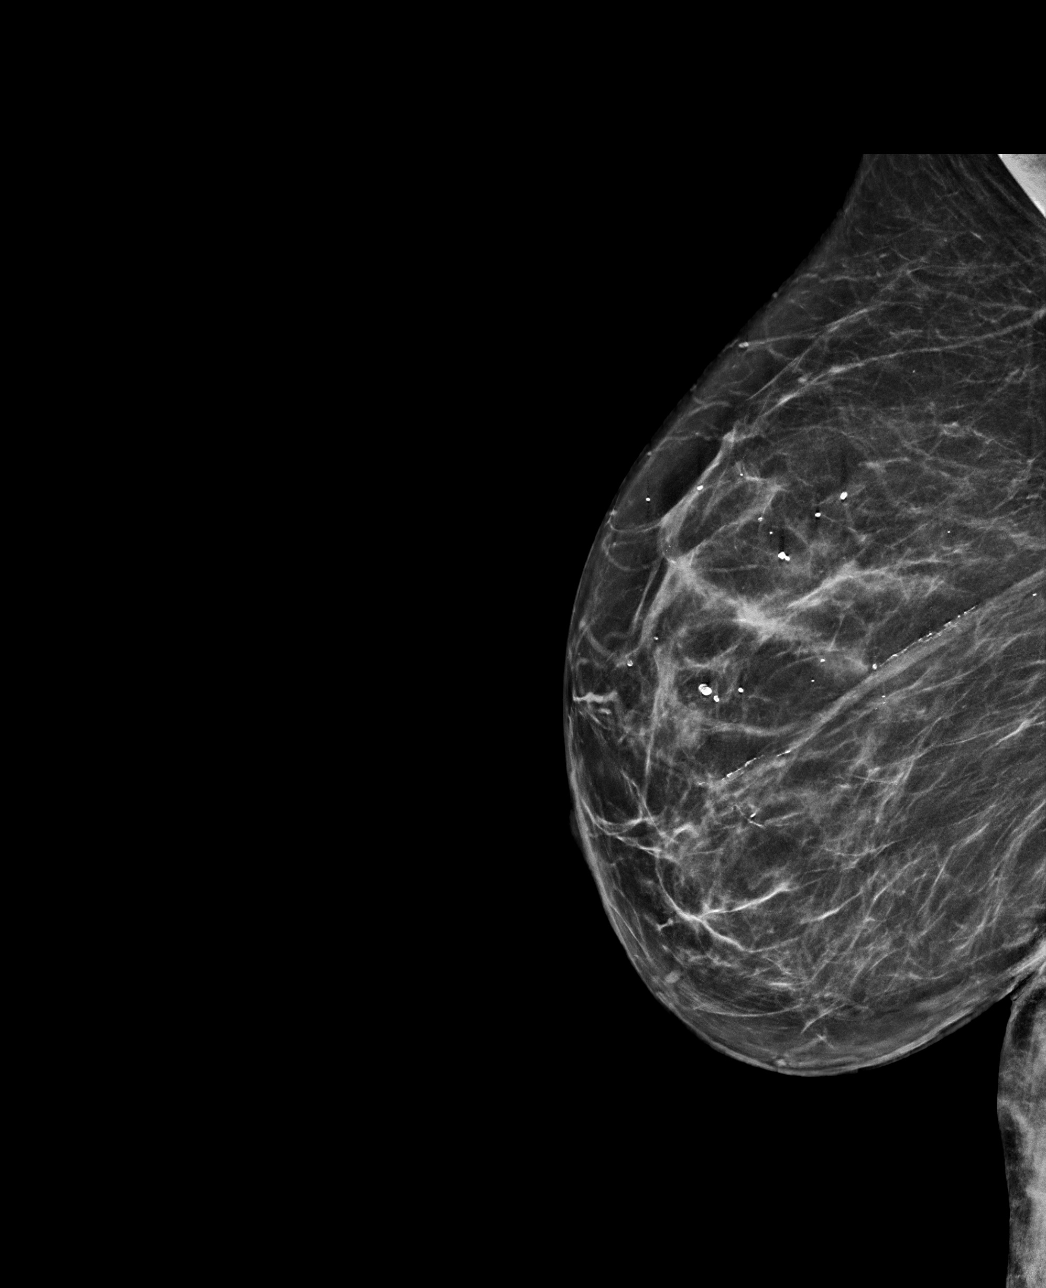

[R CC synth-2D]
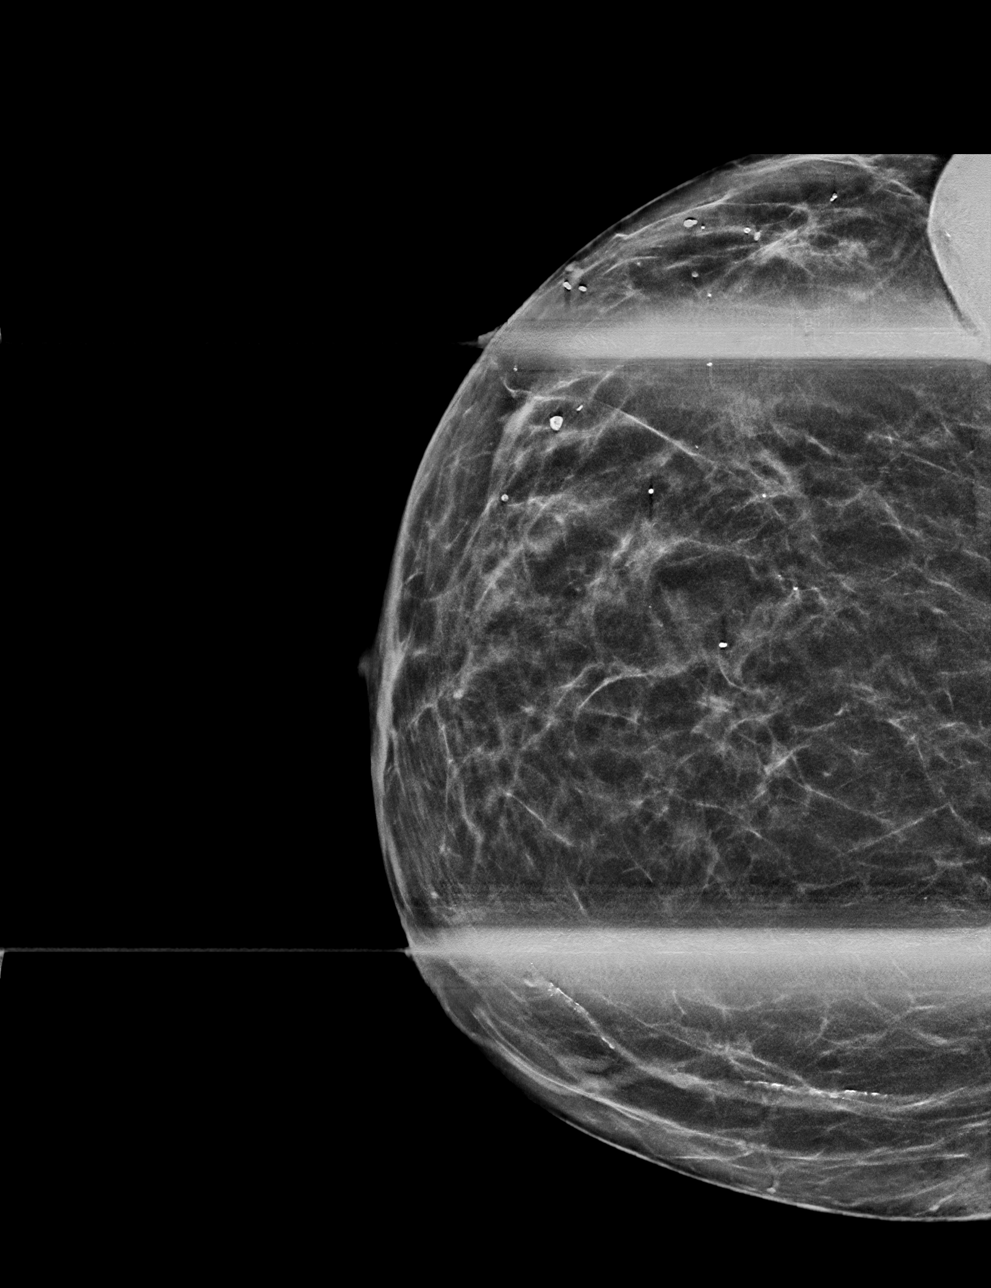

[R ML tomo · tomo slice 35/69.0]
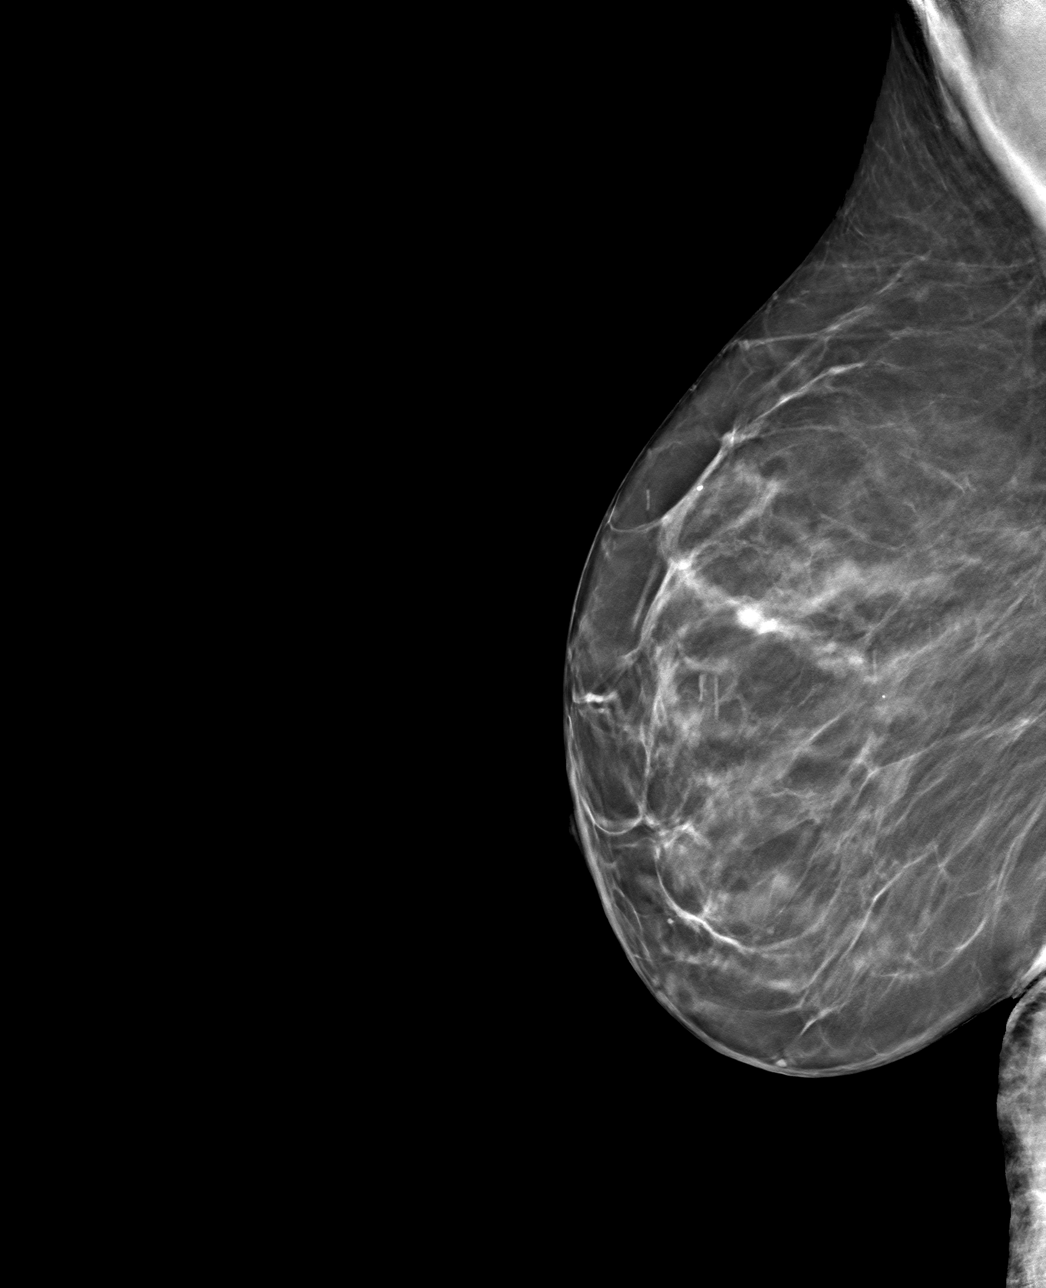

[R CC tomo · tomo slice 25/49.0]
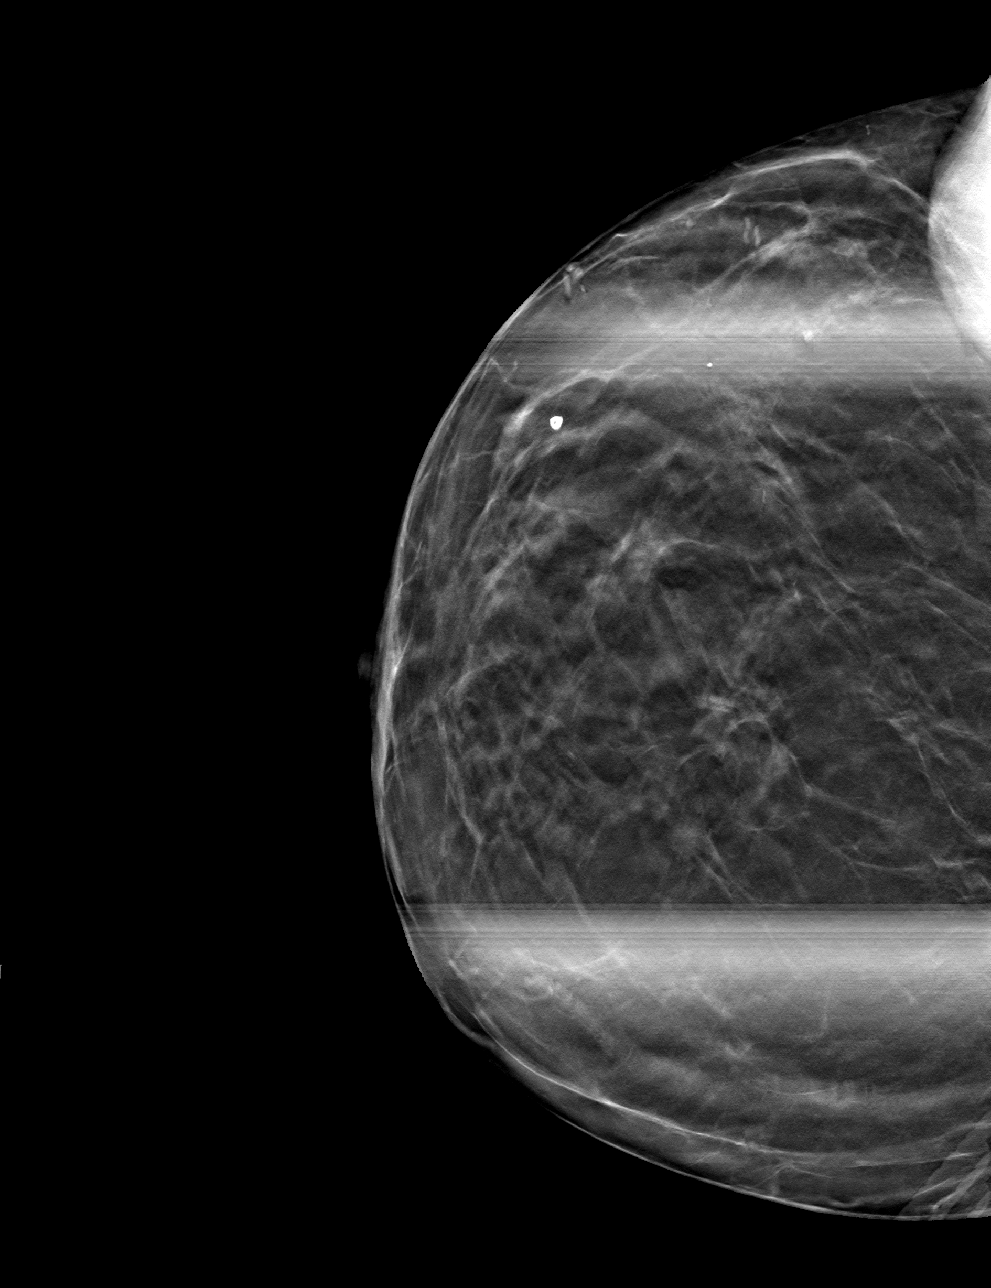

[4 of 12 positions shown; findings below may reference images not displayed]

ACR Breast Density Category b: There are scattered areas of
fibroglandular density.
FINDINGS: On today's additional diagnostic views, including spot compression
with 3D tomosynthesis, there is no persistent distortion within the
RIGHT breast and the overall fibroglandular pattern of the RIGHT
breast on today's exam is stable compared to earlier mammograms from
6039 and 1891 confirming benignity.
IMPRESSION: No evidence of malignancy.

Patient may return to routine annual bilateral screening mammogram
schedule.

RECOMMENDATION:
Screening mammogram in one year.(Code:AB-U-179)

I have discussed the findings and recommendations with the patient
and her caregiver. If applicable, a reminder letter will be sent to
the patient regarding the next appointment.

BI-RADS CATEGORY  1: Negative.

## 2022-12-28 ENCOUNTER — Ambulatory Visit
Admission: RE | Admit: 2022-12-28 | Discharge: 2022-12-28 | Disposition: A | Discharge status: Home / Self Care | Payer: Medicare Other | Source: Ambulatory Visit | Attending: Student | Admitting: Student

## 2022-12-28 DIAGNOSIS — Z1231 Encounter for screening mammogram for malignant neoplasm of breast: Secondary | ICD-10-CM

## 2023-11-16 ENCOUNTER — Other Ambulatory Visit: Payer: Self-pay | Admitting: Student

## 2023-11-16 DIAGNOSIS — Z1231 Encounter for screening mammogram for malignant neoplasm of breast: Secondary | ICD-10-CM

## 2023-11-26 ENCOUNTER — Encounter (HOSPITAL_COMMUNITY): Payer: Self-pay | Admitting: Radiology

## 2023-11-26 ENCOUNTER — Emergency Department (HOSPITAL_COMMUNITY)

## 2023-11-26 ENCOUNTER — Other Ambulatory Visit: Payer: Self-pay

## 2023-11-26 ENCOUNTER — Emergency Department (HOSPITAL_COMMUNITY)
Admission: EM | Admit: 2023-11-26 | Discharge: 2023-11-26 | Disposition: A | Discharge status: Home / Self Care | Attending: Emergency Medicine | Admitting: Emergency Medicine

## 2023-11-26 DIAGNOSIS — I10 Essential (primary) hypertension: Secondary | ICD-10-CM | POA: Insufficient documentation

## 2023-11-26 DIAGNOSIS — Z79899 Other long term (current) drug therapy: Secondary | ICD-10-CM | POA: Diagnosis not present

## 2023-11-26 DIAGNOSIS — F0150 Vascular dementia without behavioral disturbance: Secondary | ICD-10-CM | POA: Diagnosis not present

## 2023-11-26 DIAGNOSIS — Z7982 Long term (current) use of aspirin: Secondary | ICD-10-CM | POA: Insufficient documentation

## 2023-11-26 DIAGNOSIS — N390 Urinary tract infection, site not specified: Secondary | ICD-10-CM | POA: Insufficient documentation

## 2023-11-26 DIAGNOSIS — R410 Disorientation, unspecified: Secondary | ICD-10-CM | POA: Diagnosis present

## 2023-11-26 LAB — I-STAT CHEM 8, ED
BUN: 21 mg/dL (ref 8–23)
Calcium, Ion: 1.12 mmol/L — ABNORMAL LOW (ref 1.15–1.40)
Chloride: 106 mmol/L (ref 98–111)
Creatinine, Ser: 1.1 mg/dL — ABNORMAL HIGH (ref 0.44–1.00)
Glucose, Bld: 80 mg/dL (ref 70–99)
HCT: 34 % — ABNORMAL LOW (ref 36.0–46.0)
Hemoglobin: 11.6 g/dL — ABNORMAL LOW (ref 12.0–15.0)
Potassium: 4.4 mmol/L (ref 3.5–5.1)
Sodium: 141 mmol/L (ref 135–145)
TCO2: 26 mmol/L (ref 22–32)

## 2023-11-26 LAB — BLOOD GAS, VENOUS
Acid-Base Excess: 3.5 mmol/L — ABNORMAL HIGH (ref 0.0–2.0)
Bicarbonate: 29.6 mmol/L — ABNORMAL HIGH (ref 20.0–28.0)
O2 Saturation: 79.9 %
Patient temperature: 37
pCO2, Ven: 50 mmHg (ref 44–60)
pH, Ven: 7.38 (ref 7.25–7.43)
pO2, Ven: 46 mmHg — ABNORMAL HIGH (ref 32–45)

## 2023-11-26 LAB — CBC WITH DIFFERENTIAL/PLATELET
Abs Immature Granulocytes: 0.02 10*3/uL (ref 0.00–0.07)
Basophils Absolute: 0 10*3/uL (ref 0.0–0.1)
Basophils Relative: 1 %
Eosinophils Absolute: 0.1 10*3/uL (ref 0.0–0.5)
Eosinophils Relative: 1 %
HCT: 39.1 % (ref 36.0–46.0)
Hemoglobin: 12.2 g/dL (ref 12.0–15.0)
Immature Granulocytes: 0 %
Lymphocytes Relative: 25 %
Lymphs Abs: 1.6 10*3/uL (ref 0.7–4.0)
MCH: 32 pg (ref 26.0–34.0)
MCHC: 31.2 g/dL (ref 30.0–36.0)
MCV: 102.6 fL — ABNORMAL HIGH (ref 80.0–100.0)
Monocytes Absolute: 0.9 10*3/uL (ref 0.1–1.0)
Monocytes Relative: 14 %
Neutro Abs: 3.7 10*3/uL (ref 1.7–7.7)
Neutrophils Relative %: 59 %
Platelets: 261 10*3/uL (ref 150–400)
RBC: 3.81 MIL/uL — ABNORMAL LOW (ref 3.87–5.11)
RDW: 13.7 % (ref 11.5–15.5)
WBC: 6.2 10*3/uL (ref 4.0–10.5)
nRBC: 0 % (ref 0.0–0.2)

## 2023-11-26 LAB — URINALYSIS, W/ REFLEX TO CULTURE (INFECTION SUSPECTED)
Bilirubin Urine: NEGATIVE
Glucose, UA: NEGATIVE mg/dL
Hgb urine dipstick: NEGATIVE
Ketones, ur: 5 mg/dL — AB
Nitrite: POSITIVE — AB
Protein, ur: 30 mg/dL — AB
Specific Gravity, Urine: 1.014 (ref 1.005–1.030)
WBC, UA: >50 WBC/hpf (ref 0–5)
pH: 6 (ref 5.0–8.0)

## 2023-11-26 LAB — COMPREHENSIVE METABOLIC PANEL WITH GFR
ALT: 10 U/L (ref 0–44)
AST: 13 U/L — ABNORMAL LOW (ref 15–41)
Albumin: 2.6 g/dL — ABNORMAL LOW (ref 3.5–5.0)
Alkaline Phosphatase: 60 U/L (ref 38–126)
Anion gap: 10 (ref 5–15)
BUN: 20 mg/dL (ref 8–23)
CO2: 25 mmol/L (ref 22–32)
Calcium: 8.1 mg/dL — ABNORMAL LOW (ref 8.9–10.3)
Chloride: 107 mmol/L (ref 98–111)
Creatinine, Ser: 0.75 mg/dL (ref 0.44–1.00)
GFR, Estimated: >60 mL/min (ref >60)
Glucose, Bld: 86 mg/dL (ref 70–99)
Potassium: 4.1 mmol/L (ref 3.5–5.1)
Sodium: 142 mmol/L (ref 135–145)
Total Bilirubin: 0.5 mg/dL (ref 0.0–1.2)
Total Protein: 5.6 g/dL — ABNORMAL LOW (ref 6.5–8.1)

## 2023-11-26 LAB — I-STAT CG4 LACTIC ACID, ED: Lactic Acid, Venous: 0.7 mmol/L (ref 0.5–1.9)

## 2023-11-26 LAB — BRAIN NATRIURETIC PEPTIDE: B Natriuretic Peptide: 61.7 pg/mL (ref 0.0–100.0)

## 2023-11-26 LAB — TROPONIN I (HIGH SENSITIVITY): Troponin I (High Sensitivity): 11 ng/L (ref <18)

## 2023-11-26 LAB — SARS CORONAVIRUS 2 BY RT PCR: SARS Coronavirus 2 by RT PCR: NEGATIVE

## 2023-11-26 MED ORDER — CEPHALEXIN 500 MG PO CAPS: 500.0000 mg | ORAL_CAPSULE | Freq: Four times a day (QID) | ORAL | 0 refills | Status: DC

## 2023-11-26 MED ORDER — SODIUM CHLORIDE 0.9 % IV BOLUS
500.0000 mL | Freq: Once | INTRAVENOUS | Status: AC
Start: 2023-11-26 — End: 2023-11-26
  Administered 2023-11-26: 500 mL via INTRAVENOUS

## 2023-11-26 MED ORDER — SODIUM CHLORIDE 0.9 % IV SOLN
1.0000 g | Freq: Once | INTRAVENOUS | Status: AC
  Administered 2023-11-26: 1 g via INTRAVENOUS
  Filled 2023-11-26: qty 10

## 2023-11-26 NOTE — Discharge Instructions (Addendum)
Return for any problem.  ° °Take Keflex as prescribed for treatment of UTI.  °

## 2023-11-26 NOTE — ED Notes (Signed)
 Pt drank a cup of apple juice and water. Tolerated well

## 2023-11-26 NOTE — ED Provider Notes (Signed)
 [place] EMERGENCY DEPARTMENT AT [facility] Provider Note   CSN: [id number] Arrival date & time: 11/26/23  1006     History  Chief Complaint  Patient presents with   Altered Mental Status    Amy Solis is a 73 y.o. female.   73 y.o. female with medical history significant of intellectual disability, vascular dementia, depression, HTN, and HLD who was brought from memory care by EMS for evaluation of increased confusion and lethargy.  Patient was reportedly febrile.  Patient with hypoxia per EMS on their initial evaluation.  Initial blood pressure was 86/64.  Initial heart rate was 116.  BP and heart rate are improved after IV fluids -500 mL -given by EMS.  Patient was pleasantly confused at time of initial evaluation in the ED.  She is without specific complaint.  The history is provided by the patient.       Home Medications Prior to Admission medications   Medication Sig Start Date End Date Taking? Authorizing Provider  acetaminophen  (TYLENOL ) 325 MG tablet Take 650 mg by mouth every 6 (six) hours as needed (elevated temperature, pain).    [provider]  amLODipine  (NORVASC ) 5 MG tablet Take 5 mg by mouth daily.    [provider]  ammonium lactate  (LAC-HYDRIN ) 12 % lotion Apply 1 application. topically at bedtime. To bilateral legs and feet    [provider]  aspirin  81 MG tablet Take 81 mg by mouth daily.    [provider]  benazepril  (LOTENSIN ) 20 MG tablet Take 20 mg by mouth at bedtime.    [provider]  chlorhexidine  (PERIDEX ) 0.12 % solution Use as directed 15 mLs in the mouth or throat 2 (two) times daily.    [provider]  divalproex  (DEPAKOTE ) 250 MG DR tablet Take 250 mg by mouth 2 (two) times daily.    [provider]  fluticasone  (FLONASE ) 50 MCG/ACT nasal spray Place 2 sprays into both nostrils in the morning.    [provider]  gabapentin  (NEURONTIN ) 100 MG capsule  Take 200 mg by mouth at bedtime.    [provider]  meloxicam  (MOBIC ) 15 MG tablet Take 1 tablet (15 mg total) by mouth daily as needed for pain. 01/12/22   Wynetta Heckle, MD  methimazole  (TAPAZOLE ) 5 MG tablet Take 5 mg by mouth daily.    [provider]  Multiple Vitamin (MULTIVITAMIN WITH MINERALS) TABS tablet Take 1 tablet by mouth daily.    [provider]  omeprazole (PRILOSEC) 20 MG capsule Take 20 mg by mouth daily. 06/13/13   [provider]  oxybutynin  (DITROPAN -XL) 10 MG 24 hr tablet Take 10 mg by mouth at bedtime.    [provider]  PARoxetine  (PAXIL ) 30 MG tablet Take 30 mg by mouth daily.    [provider]  risperiDONE  (RISPERDAL ) 0.5 MG tablet Take 0.5 mg by mouth 2 (two) times daily.    [provider]  risperiDONE  (RISPERDAL ) 1 MG tablet Take 1 mg by mouth at bedtime.    [provider]  sodium chloride  (MURO 128) 5 % ophthalmic solution Place 1 drop into both eyes 3 (three) times daily.    [provider]  sodium chloride  1 g tablet Take 1 g by mouth every Monday.    [provider]      Allergies    Patient has no known allergies.    Review of Systems   Review of Systems  Unable  to perform ROS: Dementia  All other systems reviewed and are negative.   Physical Exam Updated Vital Signs Ht 5' (1.524 m)   Wt 61.6 kg   SpO2 94%   BMI 26.52 kg/m  Physical Exam Vitals and nursing note reviewed.  Constitutional:      General: She is not in acute distress.    Appearance: Normal appearance. She is well-developed.  HENT:     Head: Normocephalic and atraumatic.  Eyes:     Conjunctiva/sclera: Conjunctivae normal.     Pupils: Pupils are equal, round, and reactive to light.  Cardiovascular:     Rate and Rhythm: Normal rate and regular rhythm.     Heart sounds: Normal heart sounds.  Pulmonary:     Effort: Pulmonary effort is normal. No respiratory distress.     Breath sounds:  Normal breath sounds.  Abdominal:     General: There is no distension.     Palpations: Abdomen is soft.     Tenderness: There is no abdominal tenderness.  Musculoskeletal:        General: No deformity. Normal range of motion.     Cervical back: Normal range of motion and neck supple.  Skin:    General: Skin is warm and dry.  Neurological:     General: No focal deficit present.     Mental Status: She is alert and oriented to person, place, and time.     ED Results / Procedures / Treatments   Labs (all labs ordered are listed, but only abnormal results are displayed) Labs Reviewed  CULTURE, BLOOD (ROUTINE X 2)  CULTURE, BLOOD (ROUTINE X 2)  SARS CORONAVIRUS 2 BY RT PCR  CBC WITH DIFFERENTIAL/PLATELET  COMPREHENSIVE METABOLIC PANEL WITH GFR  BRAIN NATRIURETIC PEPTIDE  URINALYSIS, W/ REFLEX TO CULTURE (INFECTION SUSPECTED)  BLOOD GAS, VENOUS  I-STAT CG4 LACTIC ACID , ED  I-STAT CHEM 8, ED  TROPONIN I (HIGH SENSITIVITY)    EKG None  Radiology No results found.  Procedures Procedures    Medications Ordered in ED Medications  sodium chloride  0.9 % bolus 500 mL (has no administration in time range)    ED Course/ Medical Decision Making/ A&P                                 Medical Decision Making Amount and/or Complexity of Data Reviewed Labs: ordered. Radiology: ordered.    Medical Screen Complete  This patient presented to the ED with complaint of weakness.  This complaint involves an extensive number of treatment options. The initial differential diagnosis includes, but is not limited to, infection, metabolic abnormality, etc. This presentation is: Acute, Chronic, Self-Limited, Previously Undiagnosed, Uncertain Prognosis, Complicated, Systemic Symptoms, and Threat to Life/Bodily Function  Patient with chronic hypoxic respiratory failure on 2 to 4 L nasal cannula at all times, cognitive impairment sent from [facility] for evaluation.  Patient is without  complaint on evaluation.  Workup suggest likely urinary tract infection.  Antibiotics administered here in the ED.  No elevated white count.  Normal lactic.  Normal hemodynamics.  Renal function is at baseline.  Case discussed with patient's POA - Mr Brent Cambric - who agrees with plan of care.  Patient appropriate for discharge back to [facility].  Importance of close follow-up stressed.  Strict return precautions given and understood.  Additional history obtained:  External records from outside sources obtained and reviewed including prior ED visits and prior  Inpatient records.    Problem List / ED Course:  UTI  Disposition:  After consideration of the diagnostic results and the patients response to treatment, I feel that the patent would benefit from close outpatient follow up.          Final Clinical Impression(s) / ED Diagnoses Final diagnoses:  Urinary tract infection without hematuria, site unspecified    Rx / DC Orders ED Discharge Orders          Ordered    cephALEXin  (KEFLEX ) 500 MG capsule  4 times daily        11/26/23 1336              Burnette Carte, MD 11/26/23 1337

## 2023-11-26 NOTE — ED Notes (Signed)
 Pt is a hard stick. Unable to collect second culture before ABX

## 2023-11-26 NOTE — ED Triage Notes (Signed)
 pT FROM snf, terabella at [employer]. EMS called out for AMS and febrile. When ems arrived Pt hypoxic, O2 line was kinked. Pt placed on 4lnc 94%. INITIAL BP 86/64, hR 116, AFTER ns  BP 116/68 HR 90. Pt dementia at baseline.

## 2023-11-27 LAB — BLOOD CULTURE ID PANEL (REFLEXED) - BCID2

## 2023-11-27 NOTE — ED Notes (Signed)
 [place] lab called [employer] Charge RN that one of 2 culture bottles resulted positive for Staph. Dr. Moses Arenas notified. Acknowledges that patient does not need to be called back to ED due to possible contamination of sample.

## 2023-11-28 LAB — URINE CULTURE: Culture: >=100000 — AB

## 2023-11-29 ENCOUNTER — Telehealth (HOSPITAL_BASED_OUTPATIENT_CLINIC_OR_DEPARTMENT_OTHER): Payer: Self-pay

## 2023-11-29 LAB — CULTURE, BLOOD (ROUTINE X 2)

## 2023-11-29 NOTE — Telephone Encounter (Signed)
 Post ED Visit - Positive Culture Follow-up  Culture report reviewed by antimicrobial stewardship pharmacist: [facility] []  Court [place], Pharm.D. []  Skeet Duke, Pharm.D., BCPS AQ-ID []  Leslee Rase, Pharm.D., BCPS [x]  Garland Junk, [address].D., BCPS []  [place], [address].D., BCPS, AAHIVP []  Alcide Aly, Pharm.D., BCPS, AAHIVP []  Jerri Morale, PharmD, BCPS []  Graham Laws, PharmD, BCPS []  Cleda Curly, PharmD, BCPS []  Tamar Fairly, PharmD []  Ballard Levels, PharmD, BCPS []  Ollen Beverage, PharmD  [facility] []  Arlyne Bering, PharmD []  Sherryle Don, PharmD []  Van Gelinas, PharmD []  Delila Felty, Rph []  [place]) Cleora Daft, PharmD []  Augustina Block, PharmD []  Arie Kurtz, PharmD []  Sharlyn Deaner, PharmD []  Agnes Hose, PharmD []  Kendall Pauls, PharmD []  Gladstone Lamer, PharmD []  Armanda Bern, PharmD []  Tera Fellows, PharmD   Positive urine culture Treated with Cephalexin , organism sensitive to the same and no further patient follow-up is required at this time.  Delena Feil 11/29/2023, 9:28 AM

## 2024-01-08 ENCOUNTER — Ambulatory Visit
Admission: RE | Admit: 2024-01-08 | Discharge: 2024-01-08 | Disposition: A | Discharge status: Home / Self Care | Source: Ambulatory Visit | Attending: Student | Admitting: Student

## 2024-01-08 DIAGNOSIS — Z1231 Encounter for screening mammogram for malignant neoplasm of breast: Secondary | ICD-10-CM

## 2024-02-02 ENCOUNTER — Emergency Department (HOSPITAL_COMMUNITY)

## 2024-02-02 ENCOUNTER — Other Ambulatory Visit: Payer: Self-pay

## 2024-02-02 ENCOUNTER — Encounter (HOSPITAL_COMMUNITY): Payer: Self-pay

## 2024-02-02 ENCOUNTER — Observation Stay (HOSPITAL_COMMUNITY)
Admission: EM | Admit: 2024-02-02 | Discharge: 2024-02-04 | Disposition: A | Discharge status: Home / Self Care | Attending: Internal Medicine | Admitting: Internal Medicine

## 2024-02-02 DIAGNOSIS — N390 Urinary tract infection, site not specified: Principal | ICD-10-CM | POA: Diagnosis present

## 2024-02-02 DIAGNOSIS — J449 Chronic obstructive pulmonary disease, unspecified: Secondary | ICD-10-CM | POA: Diagnosis present

## 2024-02-02 DIAGNOSIS — G9341 Metabolic encephalopathy: Secondary | ICD-10-CM | POA: Diagnosis present

## 2024-02-02 DIAGNOSIS — R0989 Other specified symptoms and signs involving the circulatory and respiratory systems: Secondary | ICD-10-CM | POA: Diagnosis not present

## 2024-02-02 DIAGNOSIS — N179 Acute kidney failure, unspecified: Secondary | ICD-10-CM | POA: Diagnosis not present

## 2024-02-02 DIAGNOSIS — R4182 Altered mental status, unspecified: Secondary | ICD-10-CM | POA: Diagnosis not present

## 2024-02-02 DIAGNOSIS — N3000 Acute cystitis without hematuria: Secondary | ICD-10-CM | POA: Diagnosis not present

## 2024-02-02 DIAGNOSIS — I10 Essential (primary) hypertension: Secondary | ICD-10-CM | POA: Diagnosis present

## 2024-02-02 DIAGNOSIS — F0150 Vascular dementia without behavioral disturbance: Secondary | ICD-10-CM | POA: Diagnosis present

## 2024-02-02 DIAGNOSIS — E0590 Thyrotoxicosis, unspecified without thyrotoxic crisis or storm: Secondary | ICD-10-CM | POA: Diagnosis present

## 2024-02-02 DIAGNOSIS — Z7982 Long term (current) use of aspirin: Secondary | ICD-10-CM | POA: Insufficient documentation

## 2024-02-02 DIAGNOSIS — J9611 Chronic respiratory failure with hypoxia: Secondary | ICD-10-CM | POA: Diagnosis present

## 2024-02-02 LAB — I-STAT VENOUS BLOOD GAS, ED
Acid-Base Excess: 3 mmol/L — ABNORMAL HIGH (ref 0.0–2.0)
Bicarbonate: 27.3 mmol/L (ref 20.0–28.0)
Calcium, Ion: 1.08 mmol/L — ABNORMAL LOW (ref 1.15–1.40)
HCT: 37 % (ref 36.0–46.0)
Hemoglobin: 12.6 g/dL (ref 12.0–15.0)
O2 Saturation: 99 %
Potassium: 4.2 mmol/L (ref 3.5–5.1)
Sodium: 142 mmol/L (ref 135–145)
TCO2: 28 mmol/L (ref 22–32)
pCO2, Ven: 38.1 mmHg — ABNORMAL LOW (ref 44–60)
pH, Ven: 7.462 — ABNORMAL HIGH (ref 7.25–7.43)
pO2, Ven: 151 mmHg — ABNORMAL HIGH (ref 32–45)

## 2024-02-02 LAB — URINALYSIS, W/ REFLEX TO CULTURE (INFECTION SUSPECTED)
Bilirubin Urine: NEGATIVE
Glucose, UA: NEGATIVE mg/dL
Hgb urine dipstick: NEGATIVE
Ketones, ur: 5 mg/dL — AB
Nitrite: POSITIVE — AB
Protein, ur: 30 mg/dL — AB
RBC / HPF: >50 RBC/hpf (ref 0–5)
Specific Gravity, Urine: 1.02 (ref 1.005–1.030)
WBC, UA: >50 WBC/hpf (ref 0–5)
pH: 6 (ref 5.0–8.0)

## 2024-02-02 LAB — CBC WITH DIFFERENTIAL/PLATELET
Abs Immature Granulocytes: 0.04 K/uL (ref 0.00–0.07)
Basophils Absolute: 0.1 K/uL (ref 0.0–0.1)
Basophils Relative: 1 %
Eosinophils Absolute: 0 K/uL (ref 0.0–0.5)
Eosinophils Relative: 1 %
HCT: 40.4 % (ref 36.0–46.0)
Hemoglobin: 13.2 g/dL (ref 12.0–15.0)
Immature Granulocytes: 1 %
Lymphocytes Relative: 22 %
Lymphs Abs: 1.6 K/uL (ref 0.7–4.0)
MCH: 32.8 pg (ref 26.0–34.0)
MCHC: 32.7 g/dL (ref 30.0–36.0)
MCV: 100.2 fL — ABNORMAL HIGH (ref 80.0–100.0)
Monocytes Absolute: 0.9 K/uL (ref 0.1–1.0)
Monocytes Relative: 12 %
Neutro Abs: 4.6 K/uL (ref 1.7–7.7)
Neutrophils Relative %: 63 %
Platelets: 283 K/uL (ref 150–400)
RBC: 4.03 MIL/uL (ref 3.87–5.11)
RDW: 13.9 % (ref 11.5–15.5)
WBC: 7.2 K/uL (ref 4.0–10.5)
nRBC: 0 % (ref 0.0–0.2)

## 2024-02-02 LAB — COMPREHENSIVE METABOLIC PANEL WITH GFR
ALT: 8 U/L (ref 0–44)
AST: 14 U/L — ABNORMAL LOW (ref 15–41)
Albumin: 2.5 g/dL — ABNORMAL LOW (ref 3.5–5.0)
Alkaline Phosphatase: 68 U/L (ref 38–126)
Anion gap: 10 (ref 5–15)
BUN: 21 mg/dL (ref 8–23)
CO2: 25 mmol/L (ref 22–32)
Calcium: 8.4 mg/dL — ABNORMAL LOW (ref 8.9–10.3)
Chloride: 106 mmol/L (ref 98–111)
Creatinine, Ser: 1.13 mg/dL — ABNORMAL HIGH (ref 0.44–1.00)
GFR, Estimated: 51 mL/min — ABNORMAL LOW (ref >60)
Glucose, Bld: 102 mg/dL — ABNORMAL HIGH (ref 70–99)
Potassium: 4.3 mmol/L (ref 3.5–5.1)
Sodium: 141 mmol/L (ref 135–145)
Total Bilirubin: 0.6 mg/dL (ref 0.0–1.2)
Total Protein: 5.8 g/dL — ABNORMAL LOW (ref 6.5–8.1)

## 2024-02-02 LAB — MAGNESIUM: Magnesium: 2.1 mg/dL (ref 1.7–2.4)

## 2024-02-02 LAB — PROTIME-INR
INR: 1 (ref 0.8–1.2)
Prothrombin Time: 14 s (ref 11.4–15.2)

## 2024-02-02 LAB — CK: Total CK: 28 U/L — ABNORMAL LOW (ref 38–234)

## 2024-02-02 LAB — PROCALCITONIN: Procalcitonin: <0.1 ng/mL

## 2024-02-02 LAB — AMMONIA: Ammonia: 37 umol/L — ABNORMAL HIGH (ref 9–35)

## 2024-02-02 LAB — OSMOLALITY: Osmolality: 302 mosm/kg — ABNORMAL HIGH (ref 275–295)

## 2024-02-02 LAB — TSH: TSH: 1.131 u[IU]/mL (ref 0.350–4.500)

## 2024-02-02 LAB — PHOSPHORUS: Phosphorus: 4.3 mg/dL (ref 2.5–4.6)

## 2024-02-02 LAB — VALPROIC ACID LEVEL: Valproic Acid Lvl: 50 ug/mL (ref 50–100)

## 2024-02-02 LAB — I-STAT CG4 LACTIC ACID, ED: Lactic Acid, Venous: 0.9 mmol/L (ref 0.5–1.9)

## 2024-02-02 MED ORDER — LACTATED RINGERS IV BOLUS
500.0000 mL | Freq: Once | INTRAVENOUS | Status: AC
  Administered 2024-02-02: 500 mL via INTRAVENOUS

## 2024-02-02 MED ORDER — IOHEXOL 350 MG/ML SOLN
75.0000 mL | Freq: Once | INTRAVENOUS | Status: AC | PRN
  Administered 2024-02-02: 75 mL via INTRAVENOUS

## 2024-02-02 MED ORDER — LACTATED RINGERS IV BOLUS
1000.0000 mL | Freq: Once | INTRAVENOUS | Status: AC
  Administered 2024-02-02: 1000 mL via INTRAVENOUS

## 2024-02-02 MED ORDER — SODIUM CHLORIDE 0.9 % IV SOLN
1.0000 g | Freq: Once | INTRAVENOUS | Status: AC
  Administered 2024-02-02: 1 g via INTRAVENOUS
  Filled 2024-02-02: qty 10

## 2024-02-02 NOTE — Assessment & Plan Note (Signed)
 Given hypertension allow permissive hypertension for tonight

## 2024-02-02 NOTE — Assessment & Plan Note (Addendum)
 Chronic stable monitor for any sign of sundowning.  She is on  Depakote  250 twice daily Neurontin  200 at bedtime Seroquel 25 mg at bedtime and risperidone  0.5 twice daily Hold for tonight given episode of unresponsiveness

## 2024-02-02 NOTE — Assessment & Plan Note (Signed)
-   treat with Rocephin         await results of urine culture and adjust antibiotic coverage as needed  

## 2024-02-02 NOTE — Subjective & Objective (Signed)
 Patient coming from [facility] Found to have fever and decreased responsiveness noted to be not feeling well this morning diminished right lung sounds noted satting 80% on room air Patient has prior history of stroke at baseline wheelchair-bound Found to be hypotensive 90s over 50s heart rate in 120s CBG was 102 given 750 of LR This morning facility noted that she had a fever up to 102 At the time of arrival to ER patient not complaining of anything and does not endorse any chest pain or shortness of breath no nausea no vomiting no diarrhea she Does feel fairly fatigued. UA concerning for UTI Started in the ER  on Rocephin  CT head nonacute CT abdomen showed possible bladder wall thickening

## 2024-02-02 NOTE — Assessment & Plan Note (Signed)
 Chronic stable continue home medications ?

## 2024-02-02 NOTE — Assessment & Plan Note (Addendum)
 Initially in the setting of hypotension and UTI now mental status appears to be back to baseline patient does have known history of vascular dementia as well as baseline intellectual disability. Continue to monitor further At basline per facility patient not oriented to self or place Hold sedating meds for tonight

## 2024-02-02 NOTE — ED Triage Notes (Addendum)
 Pt bib [facility] from [facility] called out for fever and unresponsiveness. LKW last night at bed time. Faculty noticed patient was not feeling well this morning, when faculty returned and checked on her patient was no longer responsive. EMS reports diminished right lung sounds. Upon ems arrival patient was 80% RA.   EMS reports previous stroke, Pt is w/c bound  EMS VS   90/50 bp 120 HR  Cbg 102 30 RR CAPNOGRAPHY 35 18 lac   750 LR

## 2024-02-02 NOTE — Assessment & Plan Note (Signed)
 Check TSH T3-T4 continue methimazole 

## 2024-02-02 NOTE — Assessment & Plan Note (Signed)
 Known history of COPD continue to provide oxygen as needed

## 2024-02-02 NOTE — H&P (Signed)
 Amy Solis FMW:[id number] DOB: May 13, 1951 DOA: 02/02/2024     PCP: Kirby Shove, MD   Outpatient Specialists:   CARDS:  Dr. Aleene Passe, MD   Patient arrived to ER on 02/02/24 at 1435 Referred by Attending Randol Simmonds, MD   Patient coming from:    From facility [facility]    Chief Complaint:   Chief Complaint  Patient presents with   Code Sepsis    HPI: Amy Solis is a 73 y.o. female with medical history significant of vascular dementia, COPD, hypertension hypothyroidism, underlying intellectual disability depression HLD  Presented with confusion and fever Patient coming from [place] [facility] [place] Found to have fever and decreased responsiveness noted to be not feeling well this morning diminished right lung sounds noted satting 80% on room air Patient has prior history of stroke at baseline wheelchair-bound Found to be hypotensive 90s over 50s heart rate in 120s CBG was 102 given 750 of LR This morning facility noted that she had a fever up to 102 At the time of arrival to ER patient not complaining of anything and does not endorse any chest pain or shortness of breath no nausea no vomiting no diarrhea she Does feel fairly fatigued. UA concerning for UTI Started in the ER  on Rocephin  CT head nonacute CT abdomen showed possible bladder wall thickening    Patient had very similar admission 2023 Of note per records in the past she was on 2 L FiO2 for history of COPD Denies significant ETOH intake   Does not smoke      Regarding pertinent Chronic problems:    Hyperlipidemia - not on statins     HTN on Norvasc , benazepril     Hyperthyroidism:   Lab Results  Component Value Date   TSH 3.618 10/23/2021   On methimazole       COPD - not    on baseline oxygen   2L,  ?       Dementia -as well as baseline intellectual disability, on risperidone  oh Neurontin  and Depakote     Chronic anemia - baseline hg Hemoglobin & Hematocrit  Recent Labs     11/26/23 1058 02/02/24 1458 02/02/24 1537  HGB 11.6* 13.2 12.6   Iron/TIBC/Ferritin/ %Sat No results found for: IRON, TIBC, FERRITIN, IRONPCTSAT    While in ER: Clinical Course as of 02/02/24 2006  Sat Feb 02, 2024  1540 I-Stat venous blood gas, ([facility], [facility], [facility])(!) No acidosis noted [JK]  1616 Ammonia(!) Slightly elevated [JK]  1617 CBC with Differential(!) Normal [JK]  1617 Comprehensive metabolic panel(!) Creatinine slightly elevated [JK]  1635 Chest x-ray with elevated right hemidiaphragm some lung base opacities.  Possible lucency of the right hemiabdomen.  CT scan recommended for further evaluation [JK]  1708 Urinalysis, w/ Reflex to Culture (Infection Suspected) -Urine, Catheterized(!) Urinalysis suggestive of UTI [JK]  1844 CT scan shows irregular urinary bladder wall thickening.  Consistent with urinary suggestive of UTI [JK]  2003 Case discussed with Dr Silvester [JK]    Clinical Course User Index [JK] Randol Simmonds, MD       Lab Orders         Culture, blood (Routine x 2)         Urine Culture         Comprehensive metabolic panel         CBC with Differential         Protime-INR         Ammonia  Urinalysis, w/ Reflex to Culture (Infection Suspected) -Urine, Catheterized         I-Stat venous blood gas, ([facility], [facility], [facility])      CT HEAD   NON acute    CXR - Underinflation. Enlarged cardiopericardial silhouette with vascular congestion.   Elevated right hemidiaphragm with some lung base opacities.question lucency  CTabd/pelvis chest -  Irregular urinary bladder wall thickening may represent cystitis versus underlying malignancy. Correlate with urinalysis. Consider urologic consultation. 2. Rectal wall thickening likely due to under distension.  Following Medications were ordered in ER: Medications  lactated ringers  bolus 500 mL (0 mLs Intravenous Stopped 02/02/24 1608)  cefTRIAXone  (ROCEPHIN ) 1 g in sodium chloride  0.9 % 100 mL IVPB (0 g  Intravenous Stopped 02/02/24 1835)  iohexol  (OMNIPAQUE ) 350 MG/ML injection 75 mL (75 mLs Intravenous Contrast Given 02/02/24 1718)        ED Triage Vitals  Encounter Vitals Group     BP 02/02/24 1455 102/64     Girls Systolic BP Percentile --      Girls Diastolic BP Percentile --      Boys Systolic BP Percentile --      Boys Diastolic BP Percentile --      Pulse Rate 02/02/24 1452 (!) 110     Resp 02/02/24 1452 (!) 22     Temp 02/02/24 1452 99.5 F (37.5 C)     Temp Source 02/02/24 1452 Axillary     SpO2 02/02/24 1452 100 %     Weight 02/02/24 1453 135 lb 12.9 oz (61.6 kg)     Height 02/02/24 1453 5' (1.524 m)     Head Circumference --      Peak Flow --      Pain Score --      Pain Loc --      Pain Education --      Exclude from Growth Chart --   UFJK(75)@     _________________________________________ Significant initial  Findings: Abnormal Labs Reviewed  COMPREHENSIVE METABOLIC PANEL WITH GFR - Abnormal; Notable for the following components:      Result Value   Glucose, Bld 102 (*)    Creatinine, Ser 1.13 (*)    Calcium  8.4 (*)    Total Protein 5.8 (*)    Albumin 2.5 (*)    AST 14 (*)    GFR, Estimated 51 (*)    All other components within normal limits  CBC WITH DIFFERENTIAL/PLATELET - Abnormal; Notable for the following components:   MCV 100.2 (*)    All other components within normal limits  AMMONIA - Abnormal; Notable for the following components:   Ammonia 37 (*)    All other components within normal limits  URINALYSIS, W/ REFLEX TO CULTURE (INFECTION SUSPECTED) - Abnormal; Notable for the following components:   Color, Urine AMBER (*)    APPearance CLOUDY (*)    Ketones, ur 5 (*)    Protein, ur 30 (*)    Nitrite POSITIVE (*)    Leukocytes,Ua MODERATE (*)    Bacteria, UA MANY (*)    All other components within normal limits  I-STAT VENOUS BLOOD GAS, ED - Abnormal; Notable for the following components:   pH, Ven 7.462 (*)    pCO2, Ven 38.1 (*)    pO2, Ven  151 (*)    Acid-Base Excess 3.0 (*)    Calcium , Ion 1.08 (*)    All other components within normal limits        ECG: Ordered Personally  reviewed and interpreted by me showing: HR :99 Rhythm: Sinus rhythm Borderline left axis deviation Borderline T abnormalities, anterior leads No significant change since last tracing QTC 460  BNP (last 3 results) Recent Labs    11/26/23 1020  BNP 61.7      The recent clinical data is shown below. Vitals:   02/02/24 1730 02/02/24 1745 02/02/24 1830 02/02/24 1857  BP: (!) 108/56 115/67 (!) 96/57   Pulse: 78 85 79   Resp: 15 14 13    Temp:    98.4 F (36.9 C)  TempSrc:    Oral  SpO2: 98% 100% 97%   Weight:      Height:        WBC     Component Value Date/Time   WBC 7.2 02/02/2024 1458   LYMPHSABS 1.6 02/02/2024 1458   MONOABS 0.9 02/02/2024 1458   EOSABS 0.0 02/02/2024 1458   BASOSABS 0.1 02/02/2024 1458    Lactic Acid , Venous    Component Value Date/Time   LATICACIDVEN 0.9 02/02/2024 1537     Procalcitonin   Ordered      UA   evidence of UTI     Urine analysis:    Component Value Date/Time   COLORURINE AMBER (A) 02/02/2024 1508   APPEARANCEUR CLOUDY (A) 02/02/2024 1508   LABSPEC 1.020 02/02/2024 1508   PHURINE 6.0 02/02/2024 1508   GLUCOSEU NEGATIVE 02/02/2024 1508   HGBUR NEGATIVE 02/02/2024 1508   BILIRUBINUR NEGATIVE 02/02/2024 1508   KETONESUR 5 (A) 02/02/2024 1508   PROTEINUR 30 (A) 02/02/2024 1508   NITRITE POSITIVE (A) 02/02/2024 1508   LEUKOCYTESUR MODERATE (A) 02/02/2024 1508    ABX started Antibiotics Given (last 72 hours)     Date/Time Action Medication Dose Rate   02/02/24 1755 New Bag/Given   cefTRIAXone  (ROCEPHIN ) 1 g in sodium chloride  0.9 % 100 mL IVPB 1 g 200 mL/hr        Susceptibility data from last 90 days. Collected Specimen Info Organism AMPICILLIN AMPICILLIN/SULBACTAM CEFAZOLIN CEFEPIME  CEFTRIAXONE  Ciprofloxacin Gentamicin Susc lslt Imipenem Nitrofurantoin Susc lslt Piperacillin +  Tazobactam Trimethoprim/Sulfa  11/26/23 Urine, Random Escherichia coli  S  S  S  S  S  S  S  S  S  S  S     ________________________________________________________________   Venous  Blood Gas result:  pH   7.462 High  Sodium 142 mmol/L   pCO2, Ven 38.1 Low  mmHg Potassium 4.2 mmol/L  pO2, Ven 151 High  mmHg       __________________________________________________________ Recent Labs  Lab 02/02/24 1458 02/02/24 1537  NA 141 142  K 4.3 4.2  CO2 25  --   GLUCOSE 102*  --   BUN 21  --   CREATININE 1.13*  --   CALCIUM  8.4*  --     Cr    Up from baseline see below Lab Results  Component Value Date   CREATININE 1.13 (H) 02/02/2024   CREATININE 1.10 (H) 11/26/2023   CREATININE 0.75 11/26/2023    Recent Labs  Lab 02/02/24 1458  AST 14*  ALT 8  ALKPHOS 68  BILITOT 0.6  PROT 5.8*  ALBUMIN 2.5*   Lab Results  Component Value Date   CALCIUM  8.4 (L) 02/02/2024    Plt: Lab Results  Component Value Date   PLT 283 02/02/2024    Recent Labs  Lab 02/02/24 1458 02/02/24 1537  WBC 7.2  --   NEUTROABS 4.6  --   HGB 13.2 12.6  HCT  40.4 37.0  MCV 100.2*  --   PLT 283  --     HG/HCT  stable,       Component Value Date/Time   HGB 12.6 02/02/2024 1537   HCT 37.0 02/02/2024 1537   MCV 100.2 (H) 02/02/2024 1458     No results for input(s): LIPASE, AMYLASE in the last 168 hours. Recent Labs  Lab 02/02/24 1507  AMMONIA 37*      _______________________________________________ Hospitalist was called for admission for acute encephalopathy hypotension and UTI     The following Work up has been ordered so far:  Orders Placed This Encounter  Procedures   Critical Care   Culture, blood (Routine x 2)   Urine Culture   DG Chest Portable 1 View   CT Head Wo Contrast   CT ABDOMEN PELVIS W CONTRAST   Comprehensive metabolic panel   CBC with Differential   Protime-INR   Ammonia   Urinalysis, w/ Reflex to Culture (Infection Suspected) -Urine, Catheterized    Notify physician (specify)  Specify: Notify provider for possible Code Sepsis   Document height and weight   In and Out Cath   Consult for [facility] Admission   I-Stat venous blood gas, ([facility], [facility], [facility])   EKG 12-Lead     Cultures:    Component Value Date/Time   SDES  11/26/2023 1038    BLOOD BLOOD LEFT WRIST Performed at [facility], [address] [place], [place] [address]    SPECREQUEST  11/26/2023 1038    BOTTLES DRAWN AEROBIC AND ANAEROBIC Blood Culture results may not be optimal due to an inadequate volume of blood received in culture bottles Performed at [facility], [address], [place], [place] [address]    CULT (A) 11/26/2023 1038    STAPHYLOCOCCUS AURICULARIS THE SIGNIFICANCE OF ISOLATING THIS ORGANISM FROM A SINGLE VENIPUNCTURE CANNOT BE PREDICTED WITHOUT FURTHER CLINICAL AND CULTURE CORRELATION. SUSCEPTIBILITIES AVAILABLE ONLY ON REQUEST. Performed at [facility], [address], [place], [place] [address]    REPTSTATUS 11/29/2023 FINAL 11/26/2023 1038     Radiological Exams on Admission: CT ABDOMEN PELVIS W CONTRAST Result Date: 02/02/2024 CLINICAL DATA:  Sepsis EXAM: CT ABDOMEN AND PELVIS WITH CONTRAST TECHNIQUE: Multidetector CT imaging of the abdomen and pelvis was performed using the standard protocol following bolus administration of intravenous contrast. RADIATION DOSE REDUCTION: This exam was performed according to the departmental dose-optimization program which includes automated exposure control, adjustment of the mA and/or kV according to patient size and/or use of iterative reconstruction technique. CONTRAST:  75mL OMNIPAQUE  IOHEXOL  350 MG/ML SOLN COMPARISON:  CT abdomen pelvis 01/06/2005 FINDINGS: Lower chest: Basilar atelectasis.  Coronary artery calcification. Hepatobiliary: No focal liver abnormality. Calcified gallstone noted within the gallbladder lumen. No gallbladder wall thickening or  pericholecystic fluid. No biliary dilatation. Pancreas: Diffusely atrophic. No focal lesion. Otherwise normal pancreatic contour. No surrounding inflammatory changes. No main pancreatic ductal dilatation. Spleen: Normal in size without focal abnormality. Adrenals/Urinary Tract: No adrenal nodule bilaterally. Bilateral kidneys enhance symmetrically. No hydronephrosis. No hydroureter. Punctate right nephrolithiasis. No left nephrolithiasis. No ureterolithiasis bilaterally. Irregular urinary bladder wall thickening. On delayed imaging, there is no urothelial wall thickening and there are no filling defects in the opacified portions of the bilateral collecting systems or ureters. Stomach/Bowel: Stomach is within normal limits. No evidence of bowel wall thickening or dilatation. Rectal wall thickening likely due to under distension. Appendix appears normal. Vascular/Lymphatic: No abdominal aorta or iliac aneurysm. Moderate to  severe atherosclerotic plaque of the aorta and its branches. No abdominal, pelvic, or inguinal lymphadenopathy. Reproductive: Likely atrophic uterus. Bilateral adnexa are unremarkable. Other: No intraperitoneal free fluid. No intraperitoneal free gas. No organized fluid collection. Musculoskeletal: No abdominal wall hernia or abnormality. No suspicious lytic or blastic osseous lesions. No acute displaced fracture. L2-L3 endplate sclerosis with intervertebral disc space vacuum phenomenon. IMPRESSION: 1. Irregular urinary bladder wall thickening may represent cystitis versus underlying malignancy. Correlate with urinalysis. Consider urologic consultation. 2. Rectal wall thickening likely due to under distension. Consider correlation with physical exam to exclude underlying mass. 3. Cholelithiasis with no CT evidence of acute cholecystitis. 4. Nonobstructive right punctate nephrolithiasis. 5.  Aortic Atherosclerosis (ICD10-I70.0). Electronically Signed   By: Morgane  Naveau M.D.   On: 02/02/2024 18:00    CT Head Wo Contrast Result Date: 02/02/2024 CLINICAL DATA:  Mental status change. EXAM: CT HEAD WITHOUT CONTRAST TECHNIQUE: Contiguous axial images were obtained from the base of the skull through the vertex without intravenous contrast. RADIATION DOSE REDUCTION: This exam was performed according to the departmental dose-optimization program which includes automated exposure control, adjustment of the mA and/or kV according to patient size and/or use of iterative reconstruction technique. COMPARISON:  11/26/2023 FINDINGS: Brain: No evidence of acute infarction, hemorrhage, extra-axial collection or mass lesion/mass effect. 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 Stable ventricular size. No change from prior exam. Unchanged right frontal encephalomalacia. Remote bilateral cerebellar infarcts. Stable periventricular chronic small vessel ischemia. Vascular: Atherosclerosis of skullbase vasculature without hyperdense vessel or abnormal calcification. Skull: No fracture or focal lesion. Sinuses/Orbits: Paranasal sinuses and mastoid air cells are clear. The visualized orbits are unremarkable. Other: None. IMPRESSION: 1. No acute intracranial abnormality. 2. 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 Stable ventricular size. 3. Unchanged right frontal encephalomalacia. Remote bilateral cerebellar infarcts. Electronically Signed   By: Andrea Gasman M.D.   On: 02/02/2024 17:54   DG Chest Portable 1 View Result Date: 02/02/2024 CLINICAL DATA:  Infection. EXAM: PORTABLE CHEST 1 VIEW semi upright COMPARISON:  Chest x-ray 11/26/2023 FINDINGS: Underinflation. Under penetrated radiograph. Enlarged cardiopericardial silhouette with some prominence of the central vasculature. No pneumothorax. Mild lung base opacity with elevated right hemidiaphragm. There is significant lucency beneath the right  hemidiaphragm which could be dilated bowel but this has a differential recommend further evaluation. IMPRESSION: Underinflation. Enlarged cardiopericardial silhouette with vascular congestion. Elevated right hemidiaphragm with some lung base opacities. There is also lucency along the right hemiabdomen which could be dilated bowel. Free air is difficult to completely exclude. Please correlate with clinical presentation and recommend dedicated abdominal imaging when appropriate such as x-ray or CT to confirm etiology of the appearance. Electronically Signed   By: Ranell Bring M.D.   On: 02/02/2024 16:25   _______________________________________________________________________________________________________ Latest  Blood pressure (!) 96/57, pulse 79, temperature 98.4 F (36.9 C), temperature source Oral, resp. rate 13, height 5' (1.524 m), weight 61.6 kg, SpO2 97%.   Vitals  labs and radiology finding personally reviewed  Review of Systems:    Pertinent positives include:  Fevers, chills, fatigue,  Constitutional:  No weight loss, night sweats,  weight loss  HEENT:  No headaches, Difficulty swallowing,Tooth/dental problems,Sore throat,  No sneezing, itching, ear ache, nasal congestion, post nasal drip,  Cardio-vascular:  No chest pain, Orthopnea, PND, anasarca, dizziness, palpitations.no Bilateral lower extremity swelling  GI:  No heartburn, indigestion, abdominal pain, nausea, vomiting, diarrhea,  change in bowel habits, loss of appetite, melena, blood in stool, hematemesis Resp:  no shortness of breath at rest. No dyspnea on exertion, No excess mucus, no productive cough, No non-productive cough, No coughing up of blood.No change in color of mucus.No wheezing. Skin:  no rash or lesions. No jaundice GU:  no dysuria, change in color of urine, no urgency or frequency. No straining to urinate.  No flank pain.  Musculoskeletal:  No joint pain or no joint swelling. No decreased range of motion.  No back pain.  Psych:  No change in mood or affect. No depression or anxiety. No memory loss.  Neuro: no localizing neurological complaints, no tingling, no weakness, no double vision, no gait abnormality, no slurred speech, no confusion  All systems reviewed and apart from HOPI all are negative _______________________________________________________________________________________________ Past Medical History:   Past Medical History:  Diagnosis Date   Depressive disorder    Hyperlipemia    Hypertension    Mild mental retardation    Osteoporosis       Past Surgical History:  Procedure Laterality Date   ABDOMINAL HYSTERECTOMY      Social History:  Ambulatory    wheelchair bound, bed bound     reports that she has never smoked. She has never used smokeless tobacco. She reports that she does not drink alcohol  and does not use drugs.     Family History:   Family History  Problem Relation Age of Onset   Breast cancer Mother        diagnosed in her 34's   Heart attack Father    Heart attack Brother    ______________________________________________________________________________________________ Allergies: Not on File   Prior to Admission medications   Medication Sig Start Date End Date Taking? Authorizing Provider  amLODipine  (NORVASC ) 5 MG tablet Take 5 mg by mouth daily.    [provider]  ammonium lactate  (LAC-HYDRIN ) 12 % lotion Apply 1 application. topically at bedtime. To bilateral legs and feet    [provider]  aspirin  81 MG tablet Take 81 mg by mouth daily.    [provider]  benazepril  (LOTENSIN ) 20 MG tablet Take 20 mg by mouth at bedtime.    [provider]  cephALEXin  (KEFLEX ) 500 MG capsule Take 1 capsule (500 mg total) by mouth 4 (four) times daily. 11/26/23   Laurice Maude BROCKS, MD  chlorhexidine  (PERIDEX ) 0.12 % solution Use as directed 15 mLs in the mouth or throat 2 (two) times daily.    [provider]   divalproex  (DEPAKOTE ) 250 MG DR tablet Take 250 mg by mouth 2 (two) times daily.    [provider]  gabapentin  (NEURONTIN ) 100 MG capsule Take 200 mg by mouth at bedtime.    [provider]  methimazole  (TAPAZOLE ) 5 MG tablet Take 5 mg by mouth daily.    [provider]  Multiple Vitamin (MULTIVITAMIN WITH MINERALS) TABS tablet Take 1 tablet by mouth daily.    [provider]  omeprazole (PRILOSEC) 20 MG capsule Take 20 mg by mouth daily. 06/13/13   [provider]  oxybutynin  (DITROPAN -XL) 10 MG 24 hr tablet Take 10 mg by mouth at bedtime.    [provider]  PARoxetine  (PAXIL ) 40 MG tablet Take 40 mg by mouth daily.    [provider]  QUEtiapine (SEROQUEL) 25 MG tablet Take 25 mg by mouth at bedtime.    [provider]  risperiDONE  (RISPERDAL ) 0.5 MG tablet Take 0.5 mg by mouth  2 (two) times daily.    [provider]  sodium chloride  1 g tablet Take 1 g by mouth every Monday.    [provider]    ___________________________________________________________________________________________________ Physical Exam:    02/02/2024    6:30 PM 02/02/2024    5:45 PM 02/02/2024    5:30 PM  Vitals with BMI  Systolic 96 115 108  Diastolic 57 67 56  Pulse 79 85 78     1. General:  in No  Acute distress   Chronically ill   -appearing 2. Psychological: Alert and   Oriented 3. Head/ENT:    Dry Mucous Membranes                          Head Non traumatic, neck supple                        Poor Dentition 4. SKIN:  decreased Skin turgor,  Skin clean Dry and intact no rash    5. Heart: Regular rate and rhythm no  Murmur, no Rub or gallop 6. Lungs:  no wheezes or crackles   7. Abdomen: Soft,  non-tender, Non distended    obese  bowel sounds present 8. Lower extremities: no clubbing, cyanosis, no  edema 9. Neurologically Grossly intact, moving all 4 extremities equally   10. MSK: Normal range of motion     Chart has been reviewed  ______________________________________________________________________________________________  Assessment/Plan  73 y.o. female with medical history significant of vascular dementia, COPD, hypertension hypothyroidism, underlying intellectual disability depression HLD  Admitted for acute encephalopathy hypotension and UTI    Present on Admission:  UTI (urinary tract infection)  Chronic respiratory failure with hypoxia (HCC)  Essential hypertension  Hyperthyroidism  Vascular dementia (HCC)  Acute metabolic encephalopathy  AKI (acute kidney injury) (HCC)  COPD (chronic obstructive pulmonary disease) (HCC)     Chronic respiratory failure with hypoxia (HCC) Known history of COPD continue to provide oxygen as needed  Essential hypertension Given hypertension allow permissive hypertension for tonight  Hyperthyroidism Check TSH T3-T4 continue methimazole   Vascular dementia (HCC) Chronic stable monitor for any sign of sundowning.  She is on  Depakote  250 twice daily Neurontin  200 at bedtime Seroquel 25 mg at bedtime and risperidone  0.5 twice daily Hold for tonight given episode of unresponsiveness  Acute metabolic encephalopathy Initially in the setting of hypotension and UTI now mental status appears to be back to baseline patient does have known history of vascular dementia as well as baseline intellectual disability. Continue to monitor further At basline per facility patient not oriented to self or place Hold sedating meds for tonight  AKI (acute kidney injury) (HCC) Rehydrate obtain electrolytes CT abdomen pelvis showing no evidence of hydronephrosis or obstructive process  COPD (chronic obstructive pulmonary disease) (HCC) Chronic stable continue home medications  UTI (urinary tract infection)  - treat with Rocephin         await results of urine culture and adjust antibiotic coverage as needed    Other plan as per orders.  DVT  prophylaxis:  SCD        Code Status:   DNR/DNI   document at bedside I had personally discussed CODE STATUS with patient   ACP   none    Family Communication:   Family not at  Bedside    Diet  will need swallow eval   Disposition Plan:  Back to current facility when stable                         Following barriers for discharge:                                                         Electrolytes corrected                                                          Will need to be able to tolerate PO                                                  Would benefit from PT/OT eval prior to DC  Ordered                   Swallow eval - SLP ordered                                     Transition of care consulted                   Nutrition    consulted                                    Consults called:    NONE   Admission status:  ED Disposition     ED Disposition  Admit   Condition  --   Comment  Hospital Area: [facility] [[id number]]  Level of Care: Progressive [102]  Admit to Progressive based on following criteria: MULTISYSTEM THREATS such as stable sepsis, metabolic/electrolyte imbalance with or without encephalopathy that is responding to early treatment.  May place patient in observation at [facility] [facility] or [facility] if equivalent level of care is available:: No  Covid Evaluation: Asymptomatic - no recent exposure (last 10 days) testing not required  Diagnosis: UTI (urinary tract infection) [781136]  Admitting Physician: Shiah Berhow [[id number]]  Attending Physician: Janecia Palau [[id number]]  For patients discharging to extended facilities (i.e. SNF, AL, group homes or LTAC) initiate:: Discharge to SNF/Facility Placement COVID-19 Lab Testing Protocol           Obs      Level of care       progressive      Hanley Rispoli 02/02/2024, 9:05 PM    [facility]     after 2 AM please page floor  coverage   If 7AM-7PM, please contact the day team taking care of the patient using [email or web address]

## 2024-02-02 NOTE — ED Notes (Signed)
 Called charge nurse 2C to let them know the PT is on their way up

## 2024-02-02 NOTE — ED Provider Notes (Signed)
 [place] EMERGENCY DEPARTMENT AT [facility] Provider Note   CSN: [id number] Arrival date & time: 02/02/24  1435     Patient presents with: Code Sepsis   Amy Solis is a 73 y.o. female.   HPI   Patient has a history of hypertension hyperlipidemia depression.  Patient resides at an assisted living facility.  Patient is reportedly normally wheelchair-bound.  They noted patient had fever up to 102 and decreased responsiveness this morning.  EMS also reported decreased oxygen saturation when they picked her up.  Patient herself denies any complaints.  She denies feeling short of breath or having any chest pain.  She denies any vomiting or diarrhea.  She is not aware of having any fevers.  Prior to Admission medications   Medication Sig Start Date End Date Taking? Authorizing Provider  amLODipine  (NORVASC ) 5 MG tablet Take 5 mg by mouth daily.    [provider]  ammonium lactate  (LAC-HYDRIN ) 12 % lotion Apply 1 application. topically at bedtime. To bilateral legs and feet    [provider]  aspirin  81 MG tablet Take 81 mg by mouth daily.    [provider]  benazepril  (LOTENSIN ) 20 MG tablet Take 20 mg by mouth at bedtime.    [provider]  cephALEXin  (KEFLEX ) 500 MG capsule Take 1 capsule (500 mg total) by mouth 4 (four) times daily. 11/26/23   Laurice Maude BROCKS, MD  chlorhexidine  (PERIDEX ) 0.12 % solution Use as directed 15 mLs in the mouth or throat 2 (two) times daily.    [provider]  divalproex  (DEPAKOTE ) 250 MG DR tablet Take 250 mg by mouth 2 (two) times daily.    [provider]  gabapentin  (NEURONTIN ) 100 MG capsule Take 200 mg by mouth at bedtime.    [provider]  methimazole  (TAPAZOLE ) 5 MG tablet Take 5 mg by mouth daily.    [provider]  Multiple Vitamin (MULTIVITAMIN WITH MINERALS) TABS tablet Take 1 tablet by mouth daily.    [provider]  omeprazole (PRILOSEC) 20 MG  capsule Take 20 mg by mouth daily. 06/13/13   [provider]  oxybutynin  (DITROPAN -XL) 10 MG 24 hr tablet Take 10 mg by mouth at bedtime.    [provider]  PARoxetine  (PAXIL ) 40 MG tablet Take 40 mg by mouth daily.    [provider]  QUEtiapine (SEROQUEL) 25 MG tablet Take 25 mg by mouth at bedtime.    [provider]  risperiDONE  (RISPERDAL ) 0.5 MG tablet Take 0.5 mg by mouth 2 (two) times daily.    [provider]  sodium chloride  1 g tablet Take 1 g by mouth every Monday.    [provider]    Allergies: Patient has no allergy information on record.    Review of Systems  Updated Vital Signs BP (!) 96/57   Pulse 79   Temp 99.5 F (37.5 C) (Axillary)   Resp 13   Ht 1.524 m (5')   Wt 61.6 kg   SpO2 97%   BMI 26.52 kg/m   Physical Exam Vitals and nursing note reviewed.  Constitutional:      Appearance: She is well-developed. She is not diaphoretic.     Comments: Elderly, frail  HENT:     Head: Normocephalic and atraumatic.     Right Ear: External ear normal.     Left Ear: External ear normal.  Eyes:     General: No scleral icterus.  Right eye: No discharge.        Left eye: No discharge.     Conjunctiva/sclera: Conjunctivae normal.  Neck:     Trachea: No tracheal deviation.  Cardiovascular:     Rate and Rhythm: Normal rate.  Pulmonary:     Effort: Pulmonary effort is normal. No respiratory distress.     Breath sounds: No stridor. Wheezing and rales present.  Abdominal:     General: There is no distension.     Tenderness: There is no abdominal tenderness.  Musculoskeletal:        General: No swelling or deformity.     Cervical back: Neck supple.     Right lower leg: No edema.     Left lower leg: No edema.  Skin:    General: Skin is warm and dry.     Findings: No rash.  Neurological:     Mental Status: She is alert. Mental status is at baseline.     Cranial Nerves: No dysarthria or facial asymmetry.      Motor: Weakness present. No seizure activity.     Comments: Generalized weakness, able to lift arms and legs, patient is aware of her location and her name.  Not able to tell me the date     (all labs ordered are listed, but only abnormal results are displayed) Labs Reviewed  COMPREHENSIVE METABOLIC PANEL WITH GFR - Abnormal; Notable for the following components:      Result Value   Glucose, Bld 102 (*)    Creatinine, Ser 1.13 (*)    Calcium  8.4 (*)    Total Protein 5.8 (*)    Albumin 2.5 (*)    AST 14 (*)    GFR, Estimated 51 (*)    All other components within normal limits  CBC WITH DIFFERENTIAL/PLATELET - Abnormal; Notable for the following components:   MCV 100.2 (*)    All other components within normal limits  AMMONIA - Abnormal; Notable for the following components:   Ammonia 37 (*)    All other components within normal limits  URINALYSIS, W/ REFLEX TO CULTURE (INFECTION SUSPECTED) - Abnormal; Notable for the following components:   Color, Urine AMBER (*)    APPearance CLOUDY (*)    Ketones, ur 5 (*)    Protein, ur 30 (*)    Nitrite POSITIVE (*)    Leukocytes,Ua MODERATE (*)    Bacteria, UA MANY (*)    All other components within normal limits  I-STAT VENOUS BLOOD GAS, ED - Abnormal; Notable for the following components:   pH, Ven 7.462 (*)    pCO2, Ven 38.1 (*)    pO2, Ven 151 (*)    Acid-Base Excess 3.0 (*)    Calcium , Ion 1.08 (*)    All other components within normal limits  CULTURE, BLOOD (ROUTINE X 2)  CULTURE, BLOOD (ROUTINE X 2)  URINE CULTURE  PROTIME-INR  I-STAT CG4 LACTIC ACID , ED    EKG: None  Radiology: CT ABDOMEN PELVIS W CONTRAST Result Date: 02/02/2024 CLINICAL DATA:  Sepsis EXAM: CT ABDOMEN AND PELVIS WITH CONTRAST TECHNIQUE: Multidetector CT imaging of the abdomen and pelvis was performed using the standard protocol following bolus administration of intravenous contrast. RADIATION DOSE REDUCTION: This exam was performed according to the  departmental dose-optimization program which includes automated exposure control, adjustment of the mA and/or kV according to patient size and/or use of iterative reconstruction technique. CONTRAST:  75mL OMNIPAQUE  IOHEXOL  350 MG/ML SOLN COMPARISON:  CT abdomen pelvis 01/06/2005 FINDINGS:  Lower chest: Basilar atelectasis.  Coronary artery calcification. Hepatobiliary: No focal liver abnormality. Calcified gallstone noted within the gallbladder lumen. No gallbladder wall thickening or pericholecystic fluid. No biliary dilatation. Pancreas: Diffusely atrophic. No focal lesion. Otherwise normal pancreatic contour. No surrounding inflammatory changes. No main pancreatic ductal dilatation. Spleen: Normal in size without focal abnormality. Adrenals/Urinary Tract: No adrenal nodule bilaterally. Bilateral kidneys enhance symmetrically. No hydronephrosis. No hydroureter. Punctate right nephrolithiasis. No left nephrolithiasis. No ureterolithiasis bilaterally. Irregular urinary bladder wall thickening. On delayed imaging, there is no urothelial wall thickening and there are no filling defects in the opacified portions of the bilateral collecting systems or ureters. Stomach/Bowel: Stomach is within normal limits. No evidence of bowel wall thickening or dilatation. Rectal wall thickening likely due to under distension. Appendix appears normal. Vascular/Lymphatic: No abdominal aorta or iliac aneurysm. Moderate to severe atherosclerotic plaque of the aorta and its branches. No abdominal, pelvic, or inguinal lymphadenopathy. Reproductive: Likely atrophic uterus. Bilateral adnexa are unremarkable. Other: No intraperitoneal free fluid. No intraperitoneal free gas. No organized fluid collection. Musculoskeletal: No abdominal wall hernia or abnormality. No suspicious lytic or blastic osseous lesions. No acute displaced fracture. L2-L3 endplate sclerosis with intervertebral disc space vacuum phenomenon. IMPRESSION: 1. Irregular urinary  bladder wall thickening may represent cystitis versus underlying malignancy. Correlate with urinalysis. Consider urologic consultation. 2. Rectal wall thickening likely due to under distension. Consider correlation with physical exam to exclude underlying mass. 3. Cholelithiasis with no CT evidence of acute cholecystitis. 4. Nonobstructive right punctate nephrolithiasis. 5.  Aortic Atherosclerosis (ICD10-I70.0). Electronically Signed   By: Morgane  Naveau M.D.   On: 02/02/2024 18:00   CT Head Wo Contrast Result Date: 02/02/2024 CLINICAL DATA:  Mental status change. EXAM: CT HEAD WITHOUT CONTRAST TECHNIQUE: Contiguous axial images were obtained from the base of the skull through the vertex without intravenous contrast. RADIATION DOSE REDUCTION: This exam was performed according to the departmental dose-optimization program which includes automated exposure control, adjustment of the mA and/or kV according to patient size and/or use of iterative reconstruction technique. COMPARISON:  11/26/2023 FINDINGS: Brain: No evidence of acute infarction, hemorrhage, extra-axial collection or mass lesion/mass effect. 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 Stable ventricular size. No change from prior exam. Unchanged right frontal encephalomalacia. Remote bilateral cerebellar infarcts. Stable periventricular chronic small vessel ischemia. Vascular: Atherosclerosis of skullbase vasculature without hyperdense vessel or abnormal calcification. Skull: No fracture or focal lesion. Sinuses/Orbits: Paranasal sinuses and mastoid air cells are clear. The visualized orbits are unremarkable. Other: None. IMPRESSION: 1. No acute intracranial abnormality. 2. 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 Stable ventricular size. 3. Unchanged right frontal encephalomalacia. Remote bilateral cerebellar infarcts.  Electronically Signed   By: Andrea Gasman M.D.   On: 02/02/2024 17:54   DG Chest Portable 1 View Result Date: 02/02/2024 CLINICAL DATA:  Infection. EXAM: PORTABLE CHEST 1 VIEW semi upright COMPARISON:  Chest x-ray 11/26/2023 FINDINGS: Underinflation. Under penetrated radiograph. Enlarged cardiopericardial silhouette with some prominence of the central vasculature. No pneumothorax. Mild lung base opacity with elevated right hemidiaphragm. There is significant lucency beneath the right hemidiaphragm which could be dilated bowel but this has a differential recommend further evaluation. IMPRESSION: Underinflation. Enlarged cardiopericardial silhouette with vascular congestion. Elevated right hemidiaphragm with some lung base opacities. There is also lucency along the right hemiabdomen which could be dilated bowel. Free air is difficult to completely exclude. Please correlate with clinical presentation  and recommend dedicated abdominal imaging when appropriate such as x-ray or CT to confirm etiology of the appearance. Electronically Signed   By: Ranell Bring M.D.   On: 02/02/2024 16:25     .Critical Care  Performed by: Randol Simmonds, MD Authorized by: Randol Simmonds, MD   Critical care provider statement:    Critical care time (minutes):  30   Critical care was time spent personally by me on the following activities:  Development of treatment plan with patient or surrogate, discussions with consultants, evaluation of patient's response to treatment, examination of patient, ordering and review of laboratory studies, ordering and review of radiographic studies, ordering and performing treatments and interventions, pulse oximetry, re-evaluation of patient's condition and review of old charts    Medications Ordered in the ED  lactated ringers  bolus 500 mL (0 mLs Intravenous Stopped 02/02/24 1608)  cefTRIAXone  (ROCEPHIN ) 1 g in sodium chloride  0.9 % 100 mL IVPB (0 g Intravenous Stopped 02/02/24 1835)  iohexol   (OMNIPAQUE ) 350 MG/ML injection 75 mL (75 mLs Intravenous Contrast Given 02/02/24 1718)    Clinical Course as of 02/02/24 2003  Sat Feb 02, 2024  1540 I-Stat venous blood gas, ([facility], [facility], [facility])(!) No acidosis noted [JK]  1616 Ammonia(!) Slightly elevated [JK]  1617 CBC with Differential(!) Normal [JK]  1617 Comprehensive metabolic panel(!) Creatinine slightly elevated [JK]  1635 Chest x-ray with elevated right hemidiaphragm some lung base opacities.  Possible lucency of the right hemiabdomen.  CT scan recommended for further evaluation [JK]  1708 Urinalysis, w/ Reflex to Culture (Infection Suspected) -Urine, Catheterized(!) Urinalysis suggestive of UTI [JK]  1844 CT scan shows irregular urinary bladder wall thickening.  Consistent with urinary suggestive of UTI [JK]  2003 Case discussed with Dr Silvester [JK]    Clinical Course User Index [JK] Randol Simmonds, MD                                 Medical Decision Making Problems Addressed: Altered mental status, unspecified altered mental status type: acute illness or injury that poses a threat to life or bodily functions Lower urinary tract infectious disease: acute illness or injury that poses a threat to life or bodily functions  Amount and/or Complexity of Data Reviewed Labs: ordered. Decision-making details documented in ED Course. Radiology: ordered and independent interpretation performed.  Risk Prescription drug management.   Patient presented to ED for evaluation of fever noted to be as high as 102 today.  Patient noted to be more listless.  Concerned about the possibility of evolving sepsis.  Initial laboratory test did not show signs of severe anemia or leukocytosis.  Patient did not have any evidence of respiratory acidosis.  Ammonia only slightly elevated and doubt that is related to her symptoms.  Head CT does not show any evidence of cerebral hemorrhage or other acute abnormality.  CT scan did show findings suggestive  of possible bladder wall thickening.  Patient's urinalysis also suggestive of urinary tract infection.  Suspect this is etiology for her fever and weakness.  Patient has been started on IV antibiotics.  Will plan on admission to hospital further treatment     Final diagnoses:  Altered mental status, unspecified altered mental status type  Lower urinary tract infectious disease    ED Discharge Orders     None          Randol Simmonds, MD 02/02/24 [phone]

## 2024-02-02 NOTE — Assessment & Plan Note (Signed)
 Rehydrate obtain electrolytes CT abdomen pelvis showing no evidence of hydronephrosis or obstructive process

## 2024-02-03 DIAGNOSIS — J449 Chronic obstructive pulmonary disease, unspecified: Secondary | ICD-10-CM | POA: Diagnosis not present

## 2024-02-03 DIAGNOSIS — F0150 Vascular dementia without behavioral disturbance: Secondary | ICD-10-CM | POA: Diagnosis not present

## 2024-02-03 DIAGNOSIS — G9341 Metabolic encephalopathy: Secondary | ICD-10-CM | POA: Diagnosis not present

## 2024-02-03 DIAGNOSIS — E0590 Thyrotoxicosis, unspecified without thyrotoxic crisis or storm: Secondary | ICD-10-CM | POA: Diagnosis not present

## 2024-02-03 DIAGNOSIS — N179 Acute kidney failure, unspecified: Secondary | ICD-10-CM | POA: Diagnosis not present

## 2024-02-03 DIAGNOSIS — N390 Urinary tract infection, site not specified: Secondary | ICD-10-CM | POA: Diagnosis not present

## 2024-02-03 LAB — MRSA NEXT GEN BY PCR, NASAL: MRSA by PCR Next Gen: NOT DETECTED

## 2024-02-03 LAB — PHOSPHORUS: Phosphorus: 3.4 mg/dL (ref 2.5–4.6)

## 2024-02-03 LAB — COMPREHENSIVE METABOLIC PANEL WITH GFR
ALT: 8 U/L (ref 0–44)
AST: 17 U/L (ref 15–41)
Albumin: 2.6 g/dL — ABNORMAL LOW (ref 3.5–5.0)
Alkaline Phosphatase: 66 U/L (ref 38–126)
Anion gap: 9 (ref 5–15)
BUN: 16 mg/dL (ref 8–23)
CO2: 22 mmol/L (ref 22–32)
Calcium: 8.5 mg/dL — ABNORMAL LOW (ref 8.9–10.3)
Chloride: 109 mmol/L (ref 98–111)
Creatinine, Ser: 0.67 mg/dL (ref 0.44–1.00)
GFR, Estimated: >60 mL/min (ref >60)
Glucose, Bld: 117 mg/dL — ABNORMAL HIGH (ref 70–99)
Potassium: 3.9 mmol/L (ref 3.5–5.1)
Sodium: 140 mmol/L (ref 135–145)
Total Bilirubin: 0.6 mg/dL (ref 0.0–1.2)
Total Protein: 6 g/dL — ABNORMAL LOW (ref 6.5–8.1)

## 2024-02-03 LAB — CBC
HCT: 37.3 % (ref 36.0–46.0)
Hemoglobin: 12 g/dL (ref 12.0–15.0)
MCH: 32 pg (ref 26.0–34.0)
MCHC: 32.2 g/dL (ref 30.0–36.0)
MCV: 99.5 fL (ref 80.0–100.0)
Platelets: 231 K/uL (ref 150–400)
RBC: 3.75 MIL/uL — ABNORMAL LOW (ref 3.87–5.11)
RDW: 13.2 % (ref 11.5–15.5)
WBC: 6 K/uL (ref 4.0–10.5)
nRBC: 0 % (ref 0.0–0.2)

## 2024-02-03 LAB — PREALBUMIN: Prealbumin: 18 mg/dL (ref 18–38)

## 2024-02-03 LAB — MAGNESIUM: Magnesium: 1.9 mg/dL (ref 1.7–2.4)

## 2024-02-03 MED ORDER — ACETAMINOPHEN 650 MG RE SUPP: 650.0000 mg | Freq: Four times a day (QID) | RECTAL | Status: DC | PRN

## 2024-02-03 MED ORDER — ASPIRIN 81 MG PO CHEW
81.0000 mg | CHEWABLE_TABLET | Freq: Every day | ORAL | Status: DC
  Administered 2024-02-03 – 2024-02-04 (×2): 81 mg via ORAL
  Filled 2024-02-03 (×2): qty 1

## 2024-02-03 MED ORDER — ACETAMINOPHEN 325 MG PO TABS: 650.0000 mg | ORAL_TABLET | Freq: Four times a day (QID) | ORAL | Status: DC | PRN

## 2024-02-03 MED ORDER — SODIUM CHLORIDE 0.9 % IV SOLN: INTRAVENOUS | Status: AC

## 2024-02-03 MED ORDER — METHIMAZOLE 5 MG PO TABS
5.0000 mg | ORAL_TABLET | Freq: Every day | ORAL | Status: DC
  Administered 2024-02-03 – 2024-02-04 (×2): 5 mg via ORAL
  Filled 2024-02-03 (×2): qty 1

## 2024-02-03 MED ORDER — OXYBUTYNIN CHLORIDE ER 10 MG PO TB24
10.0000 mg | ORAL_TABLET | Freq: Every day | ORAL | Status: DC
  Administered 2024-02-03 (×2): 10 mg via ORAL
  Filled 2024-02-03 (×3): qty 1

## 2024-02-03 MED ORDER — ONDANSETRON HCL 4 MG PO TABS: 4.0000 mg | ORAL_TABLET | Freq: Four times a day (QID) | ORAL | Status: DC | PRN

## 2024-02-03 MED ORDER — SODIUM CHLORIDE 0.9 % IV SOLN
1.0000 g | INTRAVENOUS | Status: DC
  Administered 2024-02-03: 1 g via INTRAVENOUS
  Filled 2024-02-03: qty 10

## 2024-02-03 MED ORDER — PANTOPRAZOLE SODIUM 40 MG PO TBEC
40.0000 mg | DELAYED_RELEASE_TABLET | Freq: Every day | ORAL | Status: DC
  Administered 2024-02-03 – 2024-02-04 (×2): 40 mg via ORAL
  Filled 2024-02-03 (×2): qty 1

## 2024-02-03 MED ORDER — ONDANSETRON HCL 4 MG/2ML IJ SOLN: 4.0000 mg | Freq: Four times a day (QID) | INTRAMUSCULAR | Status: DC | PRN

## 2024-02-03 MED ORDER — PAROXETINE HCL 20 MG PO TABS
40.0000 mg | ORAL_TABLET | Freq: Every day | ORAL | Status: DC
  Administered 2024-02-03 – 2024-02-04 (×2): 40 mg via ORAL
  Filled 2024-02-03 (×2): qty 2

## 2024-02-03 MED ORDER — FENTANYL CITRATE PF 50 MCG/ML IJ SOSY: 12.5000 ug | PREFILLED_SYRINGE | INTRAMUSCULAR | Status: DC | PRN

## 2024-02-03 NOTE — Progress Notes (Signed)
 SATURATION QUALIFICATIONS: (This note is used to comply with regulatory documentation for home oxygen)  Patient Saturations on Room Air at Rest = 85%  Patient Saturations on Room Air while mobilizing = 85%  Patient Saturations on 2 Liters of oxygen while Ambulating = 92%  Please briefly explain why patient needs home oxygen: Pt requires 2L O2 via [place] to be able to maintain SpO2 >90%O2 with activity.  Lois Slagel B. Fleeta Lapidus PT, DPT Acute Rehabilitation Services Please use secure chat or  Call Office [phone]

## 2024-02-03 NOTE — Evaluation (Signed)
 Occupational Therapy Evaluation Patient Details Name: Amy Solis MRN: [id number] DOB: 1951/06/02 Today's Date: 02/03/2024   History of Present Illness   Pt is a 73 y.o. female presenting 7/19 from [facility] with fever and unresponsiveness. UA concerning for UTI; CT head with no acute findings, CT abdomen with possible bladder wall thickening. PMH: vascular dementia, COPD, HTN, hypothyroidism, intellectual disability, depression, HLD, CVA     Clinical Impressions PTA, per chart, pt from [facility] where staff assist with ADL and functional mobility per chart. Pt reports she can feed herself and push self around in wheelchair. Pt poor historian otherwise. Upon eval, pt with new supplemental O2 requirements requiring up to 2 L via [place] during wheelchair propulsion and transfers. Pt currently requires total A +2 for transfers; able to propel self in chair for very short bouts; suspect staff at ALF help a lot. Pt likely at baseline function. OT to sign off. Recommend mobility specialist follow to optimize endurance and maintain current functional level.    If plan is discharge home, recommend the following:   Two people to help with walking and/or transfers;Two people to help with bathing/dressing/bathroom;Assistance with cooking/housework;Assist for transportation;Help with stairs or ramp for entrance     Functional Status Assessment   Patient has had a recent decline in their functional status and demonstrates the ability to make significant improvements in function in a reasonable and predictable amount of time.     Equipment Recommendations   Other (comment) (defer to ALF staff)     Recommendations for Other Services         Precautions/Restrictions   Precautions Precautions: Fall Restrictions Weight Bearing Restrictions Per Provider Order: No     Mobility Bed Mobility Overal bed mobility: Needs Assistance Bed Mobility: Supine to Sit, Sit to Supine      Supine to sit: Total assist, +2 for safety/equipment Sit to supine: Total assist, +2 for safety/equipment   General bed mobility comments: able to initiate coming to EoB, but not purposeful and ultmately requires totalAx2 for coming to EoB and returning to supine    Transfers Overall transfer level: Needs assistance Equipment used: 2 person hand held assist Transfers: Bed to chair/wheelchair/BSC            Lateral/Scoot Transfers: Total assist, +2 physical assistance General transfer comment: pt report thats is able to make transfers herself, but once she is at [facility] and reaches for wheelchair apparent that she is unable to complete. When PT/OT use bed pad to assist with scooting to drop arm wheelchair, pt reaches up to hold onto PTs arms and apparant that similar technique is used for transfers at her facility      Balance Overall balance assessment: Needs assistance Sitting-balance support: Feet unsupported, Bilateral upper extremity supported Sitting balance-Leahy Scale: Poor Sitting balance - Comments: grossly requires outside assist but is able to achieve static balance for short bouts of time at a min guard level                                   ADL either performed or assessed with clinical judgement   ADL Overall ADL's : Needs assistance/impaired Eating/Feeding: Minimal assistance;Bed level   Grooming: Minimal assistance;Bed level   Upper Body Bathing: Moderate assistance;Sitting   Lower Body Bathing: Total assistance;Sitting/lateral leans   Upper Body Dressing : Moderate assistance;Sitting   Lower Body Dressing: Total assistance   Toilet  Transfer: Total assistance;+2 for physical assistance;+2 for safety/equipment           Functional mobility during ADLs: Total assistance;+2 for safety/equipment;+2 for physical assistance       Vision         Perception         Praxis         Pertinent Vitals/Pain Pain Assessment Pain  Assessment: No/denies pain     Extremity/Trunk Assessment Upper Extremity Assessment Upper Extremity Assessment: Generalized weakness   Lower Extremity Assessment Lower Extremity Assessment: Defer to PT evaluation   Cervical / Trunk Assessment Cervical / Trunk Assessment: Kyphotic   Communication Communication Communication: Impaired Factors Affecting Communication: Reduced clarity of speech   Cognition Arousal: Alert Behavior During Therapy: WFL for tasks assessed/performed Cognition: History of cognitive impairments                               Following commands: Impaired       Cueing  General Comments   Cueing Techniques: Verbal cues;Gestural cues;Tactile cues  SpO2 on RA 85%O2 able to maintain SpO2 >90%O2 with mobilization on 2L O2 via [place]. Pt with one bout of elevated HR to 153 bpm but quickly returns to 90s   Exercises     Shoulder Instructions      Home Living Family/patient expects to be discharged to:: Skilled nursing facility                                 Additional Comments: pt poor historian      Prior Functioning/Environment Prior Level of Function : Needs assist             Mobility Comments: B knee flexion contractures ADLs Comments: can self feed with min A per chart    OT Problem List: Decreased strength;Decreased activity tolerance;Impaired balance (sitting and/or standing);Decreased cognition;Decreased safety awareness;Decreased coordination;Decreased knowledge of use of DME or AE;Decreased knowledge of precautions;Cardiopulmonary status limiting activity   OT Treatment/Interventions:        OT Goals(Current goals can be found in the care plan section)   Acute Rehab OT Goals OT Goal Formulation: Patient unable to participate in goal setting   OT Frequency:       Co-evaluation PT/OT/SLP Co-Evaluation/Treatment: Yes Reason for Co-Treatment: Complexity of the patient's impairments (multi-system  involvement);For patient/therapist safety PT goals addressed during session: Mobility/safety with mobility OT goals addressed during session: ADL's and self-care      AM-PAC OT 6 Clicks Daily Activity     Outcome Measure Help from another person eating meals?: A Little Help from another person taking care of personal grooming?: A Little Help from another person toileting, which includes using toliet, bedpan, or urinal?: Total Help from another person bathing (including washing, rinsing, drying)?: A Lot Help from another person to put on and taking off regular upper body clothing?: A Lot Help from another person to put on and taking off regular lower body clothing?: Total 6 Click Score: 12   End of Session Equipment Utilized During Treatment: Gait belt;Other (comment);Oxygen (wheelchair) Nurse Communication: Mobility status  Activity Tolerance: Patient tolerated treatment well Patient left: in bed;with call bell/phone within reach;with bed alarm set  OT Visit Diagnosis: Unsteadiness on feet (R26.81);Muscle weakness (generalized) (M62.81);Other symptoms and signs involving cognitive function;Other (comment) (decr activity tolerance)  Time: 1244-1330 OT Time Calculation (min): 46 min Charges:  OT General Charges $OT Visit: 1 Visit OT Evaluation $OT Eval Low Complexity: 1 Low  Elma JONETTA Lebron FREDERICK, OTR/L [facility] Acute Rehabilitation Office: [phone]   Elma JONETTA Lebron 02/03/2024, 5:06 PM

## 2024-02-03 NOTE — Evaluation (Signed)
 Physical Therapy Brief Evaluation and Discharge Note Patient Details Name: Amy Solis MRN: [id number] DOB: 10/27/1950 Today's Date: 02/03/2024   History of Present Illness  Pt is a 73 y.o. female presenting 7/19 from [facility] with fever and unresponsiveness. UA concerning for UTI; CT head with no acute findings, CT abdomen with possible bladder wall thickening. PMH: vascular dementia, COPD, HTN, hypothyroidism, intellectual disability, depression, HLD, CVA  Clinical Impression  Pt poor historian but from notes pt is resident of [facility] where pt reports she can feed herself and push herself around in wheelchair. Pt has new supplement O2 requirement of 2L O2 via [place] to maintain SpO2 over 90%O2 with activity. Pt is total Ax2 for bed mobility, and transfers to and from wheelchair. Pt is able to use UE to propel wheelchair very short distance with fair ability to steer. Pt likely at her baseline level of function. PT signing off but request Mobility Specialist transfer her to wheelchair and let her push her self around to maintain level of independence.        PT Assessment Patient does not need any further PT services  Assistance Needed at Discharge  Frequent or constant Supervision/Assistance    Equipment Recommendations None recommended by PT     Precautions/Restrictions Precautions Precautions: Fall Restrictions Weight Bearing Restrictions Per Provider Order: No        Mobility  Bed Mobility   Supine/Sidelying to sit: Total assist Sit to supine/sidelying: Total assist General bed mobility comments: able to initiate coming to EoB but ultimately requires total A to achieve seated EoB  Transfers Overall transfer level: Needs assistance Equipment used: 2 person hand held assist Transfers: Bed to chair/wheelchair/BSC            Lateral/Scoot Transfers: Total assist, +2 physical assistance General transfer comment: pt report thats is able to make transfers  herself, but once she is at [facility] and reaches for wheelchair apparent that she is unable to complete. When PT/OT use bed pad to assist with scooting to drop arm wheelchair, pt reaches up to hold onto PTs arms and apparant that similar technique is used for transfers at her facility    Ambulation/Gait           General Gait Details: unable at baseline         Balance Overall balance assessment: Needs assistance Sitting-balance support: Feet unsupported, Bilateral upper extremity supported Sitting balance-Leahy Scale: Poor Sitting balance - Comments: grossly requires outside assist but is able to achieve static balance for short bouts of time at a min guard level                  Pertinent Vitals/Pain PT - Brief Vital Signs All Vital Signs Stable: No Exception to Vital Signs Stable: HR elevated to 153 briefly before returning to 90s, pt requires 2L O2 via [place] to maintain SpO2 >90%O2 Pain Assessment Pain Assessment: No/denies pain     Home Living               Additional Comments: pt poor historian from ALF   Prior Function Level of Independence: Needs assistanceComments: Requires assist for transfers and to wheelchair where she is able to propel herself very short distances prior to fatigue. Pt also reports independence in feeding herself. Likely requires assist for all other ADLs.    UE/LE Assessment   UE ROM/Strength/Tone/Coordination: Generalized weakness    LE ROM/Strength/Tone/Coordination: Impaired LE ROM/Strength/Tone/Coordination Deficits: bilateral hip, knee and ankle contractures  Communication   Communication Communication: Impaired Factors Affecting Communication: Reduced clarity of speech     Cognition Overall Cognitive Status: History of cognitive impairments - at baseline       General Comments General comments (skin integrity, edema, etc.): SpO2 on RA 85%O2 able to maintain SpO2 >90%O2 with mobilization on 2L O2 via [place]. Pt with one  bout of elevated HR to 153 bpm but quickly returns to 90s     Assessment/Plan       PT Visit Diagnosis Muscle weakness (generalized) (M62.81);Difficulty in walking, not elsewhere classified (R26.2);Other abnormalities of gait and mobility (R26.89)    No Skilled PT Patient at baseline level of functioning;Patient will have necessary level of assist by caregiver at discharge   Co-evaluation PT/OT/SLP Co-Evaluation/Treatment: Yes Reason for Co-Treatment: Complexity of the patient's impairments (multi-system involvement);For patient/therapist safety PT goals addressed during session: Mobility/safety with mobility          AMPAC 6 Clicks Help needed turning from your back to your side while in a flat bed without using bedrails?: A Lot Help needed moving from lying on your back to sitting on the side of a flat bed without using bedrails?: Total Help needed moving to and from a bed to a chair (including a wheelchair)?: Total Help needed standing up from a chair using your arms (e.g., wheelchair or bedside chair)?: Total Help needed to walk in hospital room?: Total Help needed climbing 3-5 steps with a railing? : Total 6 Click Score: 7      End of Session Equipment Utilized During Treatment: Gait belt;Oxygen Activity Tolerance: Patient tolerated treatment well Patient left: in bed;with call bell/phone within reach;with bed alarm set Nurse Communication: Mobility status;Other (comment) (need for 2L O2 and increased HR) PT Visit Diagnosis: Muscle weakness (generalized) (M62.81);Difficulty in walking, not elsewhere classified (R26.2);Other abnormalities of gait and mobility (R26.89)     Time: 1249-1330 PT Time Calculation (min) (ACUTE ONLY): 41 min  Charges:   PT Evaluation $PT Eval Moderate Complexity: 1 Mod PT Treatments $Wheel Chair Management: 8-22 mins    Monte Zinni B. Fleeta Lapidus PT, DPT Acute Rehabilitation Services Please use secure chat or  Call Office [phone]   [name]  02/03/2024, 2:20 PM

## 2024-02-03 NOTE — Plan of Care (Signed)
  Problem: Clinical Measurements: Goal: Ability to maintain clinical measurements within normal limits will improve Outcome: Progressing Goal: Will remain free from infection Outcome: Progressing Goal: Cardiovascular complication will be avoided Outcome: Progressing   

## 2024-02-03 NOTE — Evaluation (Signed)
 Clinical/Bedside Swallow Evaluation Patient Details  Name: Amy Solis MRN: [id number] Date of Birth: Oct 23, 1950  Today's Date: 02/03/2024 Time: SLP Start Time (ACUTE ONLY): 0735 SLP Stop Time (ACUTE ONLY): 0745 SLP Time Calculation (min) (ACUTE ONLY): 10 min  Past Medical History:  Past Medical History:  Diagnosis Date   Depressive disorder    Hyperlipemia    Hypertension    Mild mental retardation    Osteoporosis    Past Surgical History:  Past Surgical History:  Procedure Laterality Date   ABDOMINAL HYSTERECTOMY     HPI:  Amy Solis is a 73 y.o. female with medical history significant of vascular dementia, COPD, hypertension hypothyroidism, underlying intellectual disability, depression, HLD. Presented with confusion and fever, being treated for UTI. Patient has prior history of stroke at baseline wheelchair-bound.    Assessment / Plan / Recommendation  Clinical Impression  Pts swallowing suspected to be at baseline and within functional limits. Pt observed consuming breakfast meal of regular and thin liquid textures. She was able to self feed with set up assistance. She denies dysphagia. Pt with natural dentition, able to masticate solids (may need increased time) and noted to have mild oral residuals. She was able to alternate thin liquids to help clear oral cavity. Consecutive swallows of thin liquids via straw sip were without overt s/sx of aspiration. Continue regular thin liquid diet. No further ST needs identified.  SLP Visit Diagnosis: Dysphagia, oral phase (R13.11)    Aspiration Risk  Mild aspiration risk    Diet Recommendation   Thin;Age appropriate regular  Medication Administration: Whole meds with puree (as tolerated)    Other  Recommendations Oral Care Recommendations: Oral care BID     Assistance Recommended at Discharge    Functional Status Assessment Patient has not had a recent decline in their functional status  Frequency and Duration             Prognosis        Swallow Study   General Date of Onset: 02/02/24 HPI: Amy Solis is a 73 y.o. female with medical history significant of vascular dementia, COPD, hypertension hypothyroidism, underlying intellectual disability, depression, HLD. Presented with confusion and fever, being treated for UTI. Patient has prior history of stroke at baseline wheelchair-bound. Type of Study: Bedside Swallow Evaluation Previous Swallow Assessment: CSE 2023 Diet Prior to this Study: Regular;Thin liquids (Level 0) Temperature Spikes Noted: No Respiratory Status: Nasal cannula History of Recent Intubation: No Behavior/Cognition: Alert;Cooperative Oral Cavity Assessment: Within Functional Limits Oral Care Completed by SLP: No Oral Cavity - Dentition: Adequate natural dentition Vision: Functional for self-feeding Self-Feeding Abilities: Needs set up Patient Positioning: Upright in bed Baseline Vocal Quality: Normal Volitional Swallow: Able to elicit    Oral/Motor/Sensory Function Overall Oral Motor/Sensory Function: Within functional limits   Ice Chips Ice chips: Not tested   Thin Liquid Thin Liquid: Impaired Presentation: Straw Pharyngeal  Phase Impairments: Suspected delayed Swallow;Wet Vocal Quality    Nectar Thick Nectar Thick Liquid: Not tested   Honey Thick Honey Thick Liquid: Not tested   Puree Puree: Within functional limits Presentation: Self Fed   Solid     Solid: Impaired Presentation: Self Fed Oral Phase Functional Implications: Prolonged oral transit;Oral residue Pharyngeal Phase Impairments: Suspected delayed Swallow;Multiple swallows     Reather Steller H. MA, CCC-SLP Acute Rehabilitation Services   02/03/2024,7:54 AM

## 2024-02-03 NOTE — Care Management (Signed)
 Spoke w POA. Sebastian Silvan Reviewed Moon letter, and admitting diagnosis. He confirms that patient will return to [facility] at time of DC.  Wright,(POA)Thompson (Other) [phone]

## 2024-02-03 NOTE — Progress Notes (Signed)
 PROGRESS NOTE    Amy Solis  FMW:[id number] DOB: [birth year] DOA: 02/02/2024 PCP: Kirby Shove, MD  Outpatient Specialists:     Brief Narrative:  Patient is a 73 year old female with past medical history significant for hypertension, hyperlipidemia, query learning challenges, vascular dementia and disability.  Patient was admitted with UTI/acute encephalopathy, acute kidney injury and COPD.  Serum creatinine on presentation was 1.13, but improved to 0.67 today.  UA done on presentation revealed positive nitrite, moderate leukocytosis and many bacteria.  Cultures are pending.  02/03/2024: Patient seen.  Above documentation noted.  Patient is currently undergoing physical therapy evaluation.  Patient seems to look clinically better.   Assessment & Plan:   Principal Problem:   UTI (urinary tract infection) Active Problems:   Hyperthyroidism   Vascular dementia (HCC)   COPD (chronic obstructive pulmonary disease) (HCC)   Chronic respiratory failure with hypoxia (HCC)   Essential hypertension   AKI (acute kidney injury) (HCC)   Acute metabolic encephalopathy   Chronic respiratory failure with hypoxia (HCC) -Stable.     Essential hypertension -Continue to optimize. - Last blood pressure was 140/87 mmHg. - Cautiously manage blood pressure.    Hyperthyroidism -TSH was 1.13.     Vascular dementia [facility]) -Nurse is currently reporting management difficulties. - Continue Depakote  250 twice daily Neurontin  200 at bedtime Seroquel 25 mg at bedtime and risperidone  0.5 twice daily Hold for tonight given episode of unresponsiveness   Acute metabolic encephalopathy -Initially in the setting of hypotension and UTI  -Mental status appears seems to have improved significantly.   - As per prior documentation, patient  does have known history of -vascular dementia as well as baseline intellectual disability. Continue to monitor further At basline per facility patient not oriented to self  or place Hold sedating meds for tonight   AKI (acute kidney injury) (HCC) -Likely prerenal. - Resolved with hydration.   -CT abdomen pelvis showing no evidence of hydronephrosis or obstructive process   COPD (chronic obstructive pulmonary disease) (HCC) Chronic stable continue home medications   UTI (urinary tract infection)  - Follow cultures. - Continue Rocephin .     DVT prophylaxis:  Code Status: DO NOT RESUSCITATE. Family Communication:  Disposition Plan: Patient is observation.   Consultants:    Procedures:    Antimicrobials:  IV ceftriaxone .   Subjective: No new complaints No fever or chills. Patient is appropriate.  Objective: Vitals:   02/02/24 2123 02/02/24 2358 02/03/24 0500 02/03/24 1122  BP: (!) 144/75 115/62  (!) 140/87  Pulse: 76     Resp: 20   17  Temp: 98.3 F (36.8 C) 97.6 F (36.4 C) 97.7 F (36.5 C) 97.7 F (36.5 C)  TempSrc: Oral Oral Oral Oral  SpO2: 92%   97%  Weight: 59 kg     Height:        Intake/Output Summary (Last 24 hours) at 02/03/2024 1132 Last data filed at 02/03/2024 0657 Gross per 24 hour  Intake 1415.01 ml  Output --  Net 1415.01 ml   Filed Weights   02/02/24 1453 02/02/24 2123  Weight: 61.6 kg 59 kg    Examination:  General exam: Appears calm and comfortable.  Not in any distress. Respiratory system: Clear to auscultation.  Cardiovascular system: S1 & S2 heard Gastrointestinal system: Abdomen is soft and nontender.   Central nervous system: Awake and alert.   Extremities: No leg edema.   Data Reviewed: I have personally reviewed following labs and imaging studies  CBC: Recent Labs  Lab 02/02/24 1458 02/02/24 1537 02/03/24 0858  WBC 7.2  --  6.0  NEUTROABS 4.6  --   --   HGB 13.2 12.6 12.0  HCT 40.4 37.0 37.3  MCV 100.2*  --  99.5  PLT 283  --  231   Basic Metabolic Panel: Recent Labs  Lab 02/02/24 1458 02/02/24 1507 02/02/24 1537 02/03/24 0858  NA 141  --  142 140  K 4.3  --  4.2 3.9   CL 106  --   --  109  CO2 25  --   --  22  GLUCOSE 102*  --   --  117*  BUN 21  --   --  16  CREATININE 1.13*  --   --  0.67  CALCIUM  8.4*  --   --  8.5*  MG  --  2.1  --  1.9  PHOS  --  4.3  --  3.4   GFR: Estimated Creatinine Clearance: 49.6 mL/min (by C-G formula based on SCr of 0.67 mg/dL). Liver Function Tests: Recent Labs  Lab 02/02/24 1458 02/03/24 0858  AST 14* 17  ALT 8 8  ALKPHOS 68 66  BILITOT 0.6 0.6  PROT 5.8* 6.0*  ALBUMIN 2.5* 2.6*   No results for input(s): LIPASE, AMYLASE in the last 168 hours. Recent Labs  Lab 02/02/24 1507  AMMONIA 37*   Coagulation Profile: Recent Labs  Lab 02/02/24 1458  INR 1.0   Cardiac Enzymes: Recent Labs  Lab 02/02/24 1507  CKTOTAL 28*   BNP (last 3 results) No results for input(s): PROBNP in the last 8760 hours. HbA1C: No results for input(s): HGBA1C in the last 72 hours. CBG: No results for input(s): GLUCAP in the last 168 hours. Lipid Profile: No results for input(s): CHOL, HDL, LDLCALC, TRIG, CHOLHDL, LDLDIRECT in the last 72 hours. Thyroid  Function Tests: Recent Labs    02/02/24 1458  TSH 1.131   Anemia Panel: No results for input(s): VITAMINB12, FOLATE, FERRITIN, TIBC, IRON, RETICCTPCT in the last 72 hours. Urine analysis:    Component Value Date/Time   COLORURINE AMBER (A) 02/02/2024 1508   APPEARANCEUR CLOUDY (A) 02/02/2024 1508   LABSPEC 1.020 02/02/2024 1508   PHURINE 6.0 02/02/2024 1508   GLUCOSEU NEGATIVE 02/02/2024 1508   HGBUR NEGATIVE 02/02/2024 1508   BILIRUBINUR NEGATIVE 02/02/2024 1508   KETONESUR 5 (A) 02/02/2024 1508   PROTEINUR 30 (A) 02/02/2024 1508   NITRITE POSITIVE (A) 02/02/2024 1508   LEUKOCYTESUR MODERATE (A) 02/02/2024 1508   Sepsis Labs: @LABRCNTIP (procalcitonin:4,lacticidven:4)  ) Recent Results (from the past 240 hours)  Culture, blood (Routine x 2)     Status: None (Preliminary result)   Collection Time: 02/02/24  3:31 PM    Specimen: BLOOD  Result Value Ref Range Status   Specimen Description BLOOD RIGHT ANTECUBITAL  Final   Special Requests   Final    BOTTLES DRAWN AEROBIC AND ANAEROBIC Blood Culture adequate volume   Culture   Final    NO GROWTH < 24 HOURS Performed at [facility], [address] [place], [place] [address]    Report Status PENDING  Incomplete  Culture, blood (Routine x 2)     Status: None (Preliminary result)   Collection Time: 02/02/24  3:31 PM   Specimen: BLOOD RIGHT HAND  Result Value Ref Range Status   Specimen Description BLOOD RIGHT HAND  Final   Special Requests   Final    BOTTLES DRAWN AEROBIC ONLY Blood Culture results may not  be optimal due to an inadequate volume of blood received in culture bottles   Culture   Final    NO GROWTH < 24 HOURS Performed at [facility], [address], [place], [place] [address]    Report Status PENDING  Incomplete  MRSA Next Gen by PCR, Nasal     Status: None   Collection Time: 02/02/24  9:27 PM   Specimen: Nasal Mucosa; Nasal Swab  Result Value Ref Range Status   MRSA by PCR Next Gen NOT DETECTED NOT DETECTED Final    Comment: (NOTE) The GeneXpert MRSA Assay (FDA approved for NASAL specimens only), is one component of a comprehensive MRSA colonization surveillance program. It is not intended to diagnose MRSA infection nor to guide or monitor treatment for MRSA infections. Test performance is not FDA approved in patients less than 76 years old. Performed at [facility], [address] [place], [place] [address]          Radiology Studies: CT ABDOMEN PELVIS W CONTRAST Result Date: 02/02/2024 CLINICAL DATA:  Sepsis EXAM: CT ABDOMEN AND PELVIS WITH CONTRAST TECHNIQUE: Multidetector CT imaging of the abdomen and pelvis was performed using the standard protocol following bolus administration of intravenous contrast. RADIATION DOSE REDUCTION: This exam was performed according to the departmental dose-optimization  program which includes automated exposure control, adjustment of the mA and/or kV according to patient size and/or use of iterative reconstruction technique. CONTRAST:  75mL OMNIPAQUE  IOHEXOL  350 MG/ML SOLN COMPARISON:  CT abdomen pelvis 01/06/2005 FINDINGS: Lower chest: Basilar atelectasis.  Coronary artery calcification. Hepatobiliary: No focal liver abnormality. Calcified gallstone noted within the gallbladder lumen. No gallbladder wall thickening or pericholecystic fluid. No biliary dilatation. Pancreas: Diffusely atrophic. No focal lesion. Otherwise normal pancreatic contour. No surrounding inflammatory changes. No main pancreatic ductal dilatation. Spleen: Normal in size without focal abnormality. Adrenals/Urinary Tract: No adrenal nodule bilaterally. Bilateral kidneys enhance symmetrically. No hydronephrosis. No hydroureter. Punctate right nephrolithiasis. No left nephrolithiasis. No ureterolithiasis bilaterally. Irregular urinary bladder wall thickening. On delayed imaging, there is no urothelial wall thickening and there are no filling defects in the opacified portions of the bilateral collecting systems or ureters. Stomach/Bowel: Stomach is within normal limits. No evidence of bowel wall thickening or dilatation. Rectal wall thickening likely due to under distension. Appendix appears normal. Vascular/Lymphatic: No abdominal aorta or iliac aneurysm. Moderate to severe atherosclerotic plaque of the aorta and its branches. No abdominal, pelvic, or inguinal lymphadenopathy. Reproductive: Likely atrophic uterus. Bilateral adnexa are unremarkable. Other: No intraperitoneal free fluid. No intraperitoneal free gas. No organized fluid collection. Musculoskeletal: No abdominal wall hernia or abnormality. No suspicious lytic or blastic osseous lesions. No acute displaced fracture. L2-L3 endplate sclerosis with intervertebral disc space vacuum phenomenon. IMPRESSION: 1. Irregular urinary bladder wall thickening may  represent cystitis versus underlying malignancy. Correlate with urinalysis. Consider urologic consultation. 2. Rectal wall thickening likely due to under distension. Consider correlation with physical exam to exclude underlying mass. 3. Cholelithiasis with no CT evidence of acute cholecystitis. 4. Nonobstructive right punctate nephrolithiasis. 5.  Aortic Atherosclerosis (ICD10-I70.0). Electronically Signed   By: Morgane  Naveau M.D.   On: 02/02/2024 18:00   CT Head Wo Contrast Result Date: 02/02/2024 CLINICAL DATA:  Mental status change. EXAM: CT HEAD WITHOUT CONTRAST TECHNIQUE: Contiguous axial images were obtained from the base of the skull through the vertex without intravenous contrast. RADIATION DOSE REDUCTION: This exam was performed according to the departmental dose-optimization program which includes automated exposure control, adjustment of the  mA and/or kV according to patient size and/or use of iterative reconstruction technique. COMPARISON:  11/26/2023 FINDINGS: Brain: No evidence of acute infarction, hemorrhage, extra-axial collection or mass lesion/mass effect. 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 Stable ventricular size. No change from prior exam. Unchanged right frontal encephalomalacia. Remote bilateral cerebellar infarcts. Stable periventricular chronic small vessel ischemia. Vascular: Atherosclerosis of skullbase vasculature without hyperdense vessel or abnormal calcification. Skull: No fracture or focal lesion. Sinuses/Orbits: Paranasal sinuses and mastoid air cells are clear. The visualized orbits are unremarkable. Other: None. IMPRESSION: 1. No acute intracranial abnormality. 2. 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 Stable ventricular size. 3. Unchanged right frontal encephalomalacia. Remote bilateral cerebellar infarcts. Electronically Signed   By: Andrea Gasman M.D.   On: 02/02/2024 17:54   DG Chest Portable 1 View Result Date: 02/02/2024 CLINICAL DATA:  Infection. EXAM: PORTABLE CHEST 1 VIEW semi upright COMPARISON:  Chest x-ray 11/26/2023 FINDINGS: Underinflation. Under penetrated radiograph. Enlarged cardiopericardial silhouette with some prominence of the central vasculature. No pneumothorax. Mild lung base opacity with elevated right hemidiaphragm. There is significant lucency beneath the right hemidiaphragm which could be dilated bowel but this has a differential recommend further evaluation. IMPRESSION: Underinflation. Enlarged cardiopericardial silhouette with vascular congestion. Elevated right hemidiaphragm with some lung base opacities. There is also lucency along the right hemiabdomen which could be dilated bowel. Free air is difficult to completely exclude. Please correlate with clinical presentation and recommend dedicated abdominal imaging when appropriate such as x-ray or CT to confirm etiology of the appearance. Electronically Signed   By: Ranell Bring M.D.   On: 02/02/2024 16:25        Scheduled Meds:  aspirin   81 mg Oral Daily   methimazole   5 mg Oral Daily   oxybutynin   10 mg Oral QHS   pantoprazole   40 mg Oral Daily   PARoxetine   40 mg Oral Daily   Continuous Infusions:  sodium chloride  75 mL/hr at 02/03/24 0657   cefTRIAXone  (ROCEPHIN )  IV       LOS: 0 days    Time spent: 55 minutes.    Leatrice Chapel, MD  [facility] Pager #: [phone] 7PM-7AM contact night coverage as above

## 2024-02-03 NOTE — Care Management Obs Status (Signed)
 MEDICARE OBSERVATION STATUS NOTIFICATION   Patient Details  Name: Amy Solis MRN: [id number] Date of Birth: 28-Feb-1951   Medicare Observation Status Notification Given:  Yes    Marval Gell, RN 02/03/2024, 3:57 PM

## 2024-02-03 NOTE — Progress Notes (Signed)
 RN was notified that pt had taken out her IV. Upon assessment. The IV was out but intact. Pt was stable and bleeding had stopped. Fluids were stopped and MD was notified. Will continue to monitor and a new IV will be placed.

## 2024-02-03 NOTE — Care Management (Signed)
 LVM requesting callback to   Wright,(POA)Thompson (Other) [phone]   Will review OBS notice if callback received.

## 2024-02-04 ENCOUNTER — Other Ambulatory Visit (HOSPITAL_COMMUNITY): Payer: Self-pay

## 2024-02-04 DIAGNOSIS — N390 Urinary tract infection, site not specified: Secondary | ICD-10-CM | POA: Diagnosis not present

## 2024-02-04 DIAGNOSIS — G9341 Metabolic encephalopathy: Secondary | ICD-10-CM | POA: Diagnosis not present

## 2024-02-04 LAB — URINE CULTURE: Culture: >=100000 — AB

## 2024-02-04 MED ORDER — ENSURE PLUS HIGH PROTEIN PO LIQD
237.0000 mL | Freq: Every day | ORAL | Status: DC
  Administered 2024-02-04: 237 mL via ORAL

## 2024-02-04 MED ORDER — ADULT MULTIVITAMIN W/MINERALS CH
1.0000 | ORAL_TABLET | Freq: Every day | ORAL | Status: DC
  Administered 2024-02-04: 1 via ORAL
  Filled 2024-02-04: qty 1

## 2024-02-04 MED ORDER — CEPHALEXIN 500 MG PO CAPS
500.0000 mg | ORAL_CAPSULE | Freq: Four times a day (QID) | ORAL | 0 refills | Status: AC
  Filled 2024-02-04: qty 20, 5d supply, fill #0

## 2024-02-04 MED ORDER — ENSURE PLUS HIGH PROTEIN PO LIQD
237.0000 mL | Freq: Every day | ORAL | 1 refills | Status: AC
Start: 2024-02-05 — End: ?
  Filled 2024-02-04: qty 7110, 30d supply, fill #0

## 2024-02-04 NOTE — Discharge Summary (Signed)
 Physician Discharge Summary  Patient ID: Amy Solis MRN: [id number] DOB/AGE: [birth year] 73 y.o.  Admit date: 02/02/2024 Discharge date: 02/04/2024  Admission Diagnoses:  Discharge Diagnoses:  Principal Problem:   UTI (urinary tract infection) Active Problems:   Hyperthyroidism   Vascular dementia (HCC)   COPD (chronic obstructive pulmonary disease) (HCC)   Chronic respiratory failure with hypoxia (HCC)   Essential hypertension   AKI (acute kidney injury) (HCC)   Acute metabolic encephalopathy   Discharged Condition: stable  Hospital Course: Patient is a 73 year old female with past medical history significant for hypertension, hyperlipidemia, query learning challenges, vascular dementia and disability.  Patient was admitted with UTI/acute encephalopathy, acute kidney injury and COPD.  Serum creatinine on presentation was 1.13, but improved to 0.67 with hydration.  Urine culture has grown pansensitive E. coli.  Patient was initially managed with IV ceftriaxone .  Patient will be discharged on Keflex .  Encephalopathy has resolved.  Patient be discharged back to the facility.    Chronic respiratory failure with hypoxia (HCC) -Stable.     Essential hypertension - Uncontrolled. - Continue amlodipine  5 Mg p.o. once daily.     Hyperthyroidism -TSH was 1.13.     Vascular dementia (HCC) - Behavioral problems.   - Continue Depakote  250 twice daily Neurontin  200 at bedtime Seroquel 25 mg at bedtime and risperidone  0.5 twice daily -Minimize antipsychotics.     Acute metabolic encephalopathy - Likely multifactorial (volume depletion, acute kidney injury UTI secondary to E. coli).   - Acute encephalopathy has resolved.    AKI (acute kidney injury) (HCC) -Likely prerenal. - Resolved with hydration.   -CT abdomen pelvis revealed no evidence of hydronephrosis or obstructive process   COPD (chronic obstructive pulmonary disease) (HCC) Chronic stable continue home medications   UTI  (urinary tract infection) -Urine culture grew pansensitive E. coli. - Patient will be discharged back to the facility on Keflex .       Consults: None  Significant Diagnostic Studies:  -Urine culture grew E. coli.   Discharge Exam: Blood pressure 131/75, pulse (!) 195, temperature 98.2 F (36.8 C), temperature source Oral, resp. rate 20, height 5' (1.524 m), weight 59 kg, SpO2 90%.   Disposition: Discharge disposition: 01-Home or Self Care       Discharge Instructions     Diet - low sodium heart healthy   Complete by: As directed    Increase activity slowly   Complete by: As directed       Allergies as of 02/04/2024   No Known Allergies      Medication List     STOP taking these medications    ammonium lactate  12 % lotion Commonly known as: LAC-HYDRIN    benazepril  20 MG tablet Commonly known as: LOTENSIN    divalproex  250 MG DR tablet Commonly known as: DEPAKOTE    gabapentin  100 MG capsule Commonly known as: NEURONTIN    hydrocortisone cream 1 %   OXYGEN   QUEtiapine 25 MG tablet Commonly known as: SEROQUEL   risperiDONE  0.5 MG tablet Commonly known as: RISPERDAL    sodium chloride  1 g tablet       TAKE these medications    amLODipine  5 MG tablet Commonly known as: NORVASC  Take 5 mg by mouth daily.   aspirin  81 MG tablet Take 81 mg by mouth daily.   cephALEXin  500 MG capsule Commonly known as: KEFLEX  Take 1 capsule (500 mg total) by mouth 4 (four) times daily for 5 days.   feeding supplement Liqd Take 237 mLs by  mouth daily. Start taking on: February 05, 2024   methimazole  5 MG tablet Commonly known as: TAPAZOLE  Take 5 mg by mouth daily.   multivitamin with minerals Tabs tablet Take 1 tablet by mouth daily.   omeprazole 20 MG capsule Commonly known as: PRILOSEC Take 20 mg by mouth daily.   oxybutynin  10 MG 24 hr tablet Commonly known as: DITROPAN -XL Take 10 mg by mouth at bedtime.   PARoxetine  40 MG tablet Commonly known as:  PAXIL  Take 40 mg by mouth daily.       Time spent: 35 minutes  Signed: Leatrice LILLETTE Chapel 02/04/2024, 2:46 PM

## 2024-02-04 NOTE — TOC Transition Note (Signed)
 Transition of Care [facility]) - Discharge Note   Patient Details  Name: Amy Solis MRN: [id number] Date of Birth: May 29, 1951  Transition of Care [facility]) CM/SW Contact:  Lauraine FORBES Saa, LCSW Phone Number: 02/04/2024, 3:23 PM   Clinical Narrative:     [facility] Anticipated DC date: 02/04/2024 Family notified: Sebastian Silvan; Other/HCPOA; [phone] Transport by: [place]   Per MD patient ready for DC to [facility]. RN to call report prior to discharge [phone]). RN, patient's family, and facility notified of DC (patient is not fully oriented). Discharge Summary and FL2 sent to facility. DC packet on chart. Ambulance transport requested for patient at 15:15.   CSW will sign off for now as social work intervention is no longer needed. Please consult us  again if new needs arise.    Final next level of care: Assisted Living Barriers to Discharge: Barriers Resolved   Patient Goals and CMS Choice            Discharge Placement              Patient chooses bed at: Other - please specify in the comment section below: [facility]) Patient to be transferred to facility by: [facility] Name of family member notified: Sebastian Silvan; Other/POA; [phone] Patient and family notified of of transfer: 02/04/24  Discharge Plan and Services Additional resources added to the After Visit Summary for                                       Social Drivers of Health (SDOH) Interventions SDOH Screenings   Food Insecurity: Patient Unable To Answer (02/03/2024)  Housing: Patient Unable To Answer (02/03/2024)  Transportation Needs: Patient Unable To Answer (02/03/2024)  Utilities: Patient Unable To Answer (02/03/2024)  Social Connections: Patient Unable To Answer (02/03/2024)  Tobacco Use: Low Risk  (02/02/2024)     Readmission Risk Interventions     No data to display

## 2024-02-04 NOTE — Progress Notes (Signed)
 Report called to [facility] Memory [facility].

## 2024-02-04 NOTE — TOC Initial Note (Signed)
 Transition of Care [facility]) - Initial/Assessment Note    Patient Details  Name: Amy Solis MRN: [id number] Date of Birth: 1950/12/28  Transition of [facility]) CM/SW Contact:    Lauraine FORBES Saa, LCSW Phone Number: 02/04/2024, 11:34 AM  Clinical Narrative:                  11:34 AM Per chart review, patient is from [facility]. Physical and Occupational therapy have signed off and did not have follow up recommendations. Patient has a PCP and insurance. Patient has HH history with [facility] and [address] Patient has oxygen history with [facility] and [facility]. Patient does not have a preferred pharmacy. Patient has ALF/SNF history with [facility].  Expected Discharge Plan: Assisted Living Barriers to Discharge: Continued Medical Work up   Patient Goals and CMS Choice            Expected Discharge Plan and Services       Living arrangements for the past 2 months: Assisted Living Facility                                      Prior Living Arrangements/Services Living arrangements for the past 2 months: Assisted Living Facility Lives with:: Facility Resident Patient language and need for interpreter reviewed:: Yes        Need for Family Participation in Patient Care: Yes (Comment) Care giver support system in place?: Yes (comment)   Criminal Activity/Legal Involvement Pertinent to Current Situation/Hospitalization: No - Comment as needed  Activities of Daily Living   ADL Screening (condition at time of admission) Independently performs ADLs?: No Does the patient have a NEW difficulty with bathing/dressing/toileting/self-feeding that is expected to last >3 days?: No Does the patient have a NEW difficulty with getting in/out of bed, walking, or climbing stairs that is expected to last >3 days?: No Does the patient have a NEW difficulty with communication that is expected to last >3 days?: No  Permission Sought/Granted Permission sought to share  information with : Family Supports Permission granted to share information with : No (Contact information on chart)  Share Information with NAME: Sebastian Silvan  Permission granted to share info w AGENCY: ALF  Permission granted to share info w Relationship: Other/POA  Permission granted to share info w Contact Information: [phone]  Emotional Assessment Appearance:: Appears stated age Attitude/Demeanor/Rapport: Unable to Assess Affect (typically observed): Unable to Assess Orientation: : Oriented to Self Alcohol  / Substance Use: Not Applicable Psych Involvement: No (comment)  Admission diagnosis:  Lower urinary tract infectious disease [N39.0] UTI (urinary tract infection) [N39.0] Altered mental status, unspecified altered mental status type [R41.82] Patient Active Problem List   Diagnosis Date Noted   UTI (urinary tract infection) 02/02/2024   AKI (acute kidney injury) (HCC) 01/10/2022   Acute metabolic encephalopathy 01/10/2022   Pneumonia 10/26/2021   DNR (do not resuscitate) 10/26/2021   Hyperthyroidism 10/22/2021   Vascular dementia (HCC) 10/22/2021   COPD (chronic obstructive pulmonary disease) (HCC) 10/22/2021   Chronic respiratory failure with hypoxia (HCC) 10/22/2021   Essential hypertension 10/22/2021   Hypokalemia 10/22/2021   Toxic encephalopathy 10/21/2021   Acute encephalopathy 10/20/2021   AMS (altered mental status) 10/20/2021   Benzodiazepine causing adverse effect in therapeutic use 02/15/2019   Hypoxemic encephalopathy (HCC) 02/15/2019   Diastolic dysfunction 06/06/2016   Nocturnal hypoxemia 05/09/2016   Coronary artery calcification 05/09/2016   Family history of  early CAD 05/09/2016   Dyspnea 03/09/2016   PCP:  Kirby Shove, MD Pharmacy:  No Pharmacies Listed    Social Drivers of Health (SDOH) Social History: SDOH Screenings   Food Insecurity: Patient Unable To Answer (02/03/2024)  Housing: Patient Unable To Answer (02/03/2024)   Transportation Needs: Patient Unable To Answer (02/03/2024)  Utilities: Patient Unable To Answer (02/03/2024)  Social Connections: Patient Unable To Answer (02/03/2024)  Tobacco Use: Low Risk  (02/02/2024)   SDOH Interventions:     Readmission Risk Interventions     No data to display

## 2024-02-04 NOTE — Progress Notes (Addendum)
 Initial Nutrition Assessment  DOCUMENTATION CODES:   Not applicable  INTERVENTION:  Feeding assistance w/ meals Add Ensure Plus High Protein po daily, each supplement provides 350 kcal and 20 grams of protein  Add MVI w/ minerals  NUTRITION DIAGNOSIS:  Increased nutrient needs related to acute illness as evidenced by estimated needs.  GOAL:  Patient will meet greater than or equal to 90% of their needs  MONITOR:  PO intake, Supplement acceptance  REASON FOR ASSESSMENT:   Consult Assessment of nutrition requirement/status  ASSESSMENT:   Pt with PMH significant for: HTN, HLD, vascular dementia, cognitive impairment, and disability. Admitted with UTI/acute encephalopathy, AKI, and COPD.   Has stabilized medically.  Wheelchair bound at baseline. Yelling out this morning as she wants to be taken outside. MD in during bedside visit stating patient to discharge today.   Average Meal Intake 7/21: 50% x1 documented meal  Patient is unreliable historian and, therefore, cannot endorse usual intake. She resides at [facility] assisted living where meals are provided. Can reasonably assume these are nutritionally balanced, however unable to confirm how patient's appetite was PTA. Dentition adequate and she appears well-nourished. Bowels stable. Skin intact. Will add one Ensure supplement daily to augment intake, as needed.   Admit/Current Weight: 59 kg  Per chart review, no recent weight trends to assess. Previous ED visit in May showed weight of 61.6kg, however appears to be pulled forward from an admission over two years ago. New weight has been collected and does not vary significantly from previous stated weight.    Meds: pantoprazole , IV ABX  Labs from 7/20 reviewed: Na+ 140 (wdl) K+ 3.9 (wdl) CBGs 102-117 x24 hours  NUTRITION - FOCUSED PHYSICAL EXAM: While she presents w/ severe lower extremity muscle wasting, this can be explained with patient's status as disabled and  wheelchair bound. Muscle wasting d/t atrophy and not malnutrition. Does not meet criteria for malnutrition at this time as no significant fat wasting appreciated on exam.   Flowsheet Row Most Recent Value  Orbital Region No depletion  Upper Arm Region No depletion  Thoracic and Lumbar Region No depletion  Buccal Region No depletion  Temple Region Mild depletion  Clavicle Bone Region No depletion  Clavicle and Acromion Bone Region No depletion  Scapular Bone Region No depletion  Dorsal Hand No depletion  Patellar Region Moderate depletion  Anterior Thigh Region Severe depletion  Posterior Calf Region Severe depletion  Edema (RD Assessment) None  Hair Reviewed  Eyes Reviewed  Mouth Reviewed  Skin Reviewed  Nails Reviewed    Diet Order:   Diet Order             Diet Heart Room service appropriate? Yes; Fluid consistency: Thin  Diet effective now            EDUCATION NEEDS:   Not appropriate for education at this time  Skin:  Skin Assessment: Reviewed RN Assessment  Last BM:  7/20 - type 5 x1  Height:  Ht Readings from Last 1 Encounters:  02/02/24 5' (1.524 m)   Weight:  Wt Readings from Last 1 Encounters:  02/02/24 59 kg   Ideal Body Weight:  45.5 kg  BMI:  Body mass index is 25.4 kg/m.  Estimated Nutritional Needs:   Kcal:  1300-1500 kcals  Protein:  60-75g  Fluid:  1.3-1.5L/day  Blair Deaner MS, RD, LDN Registered Dietitian Clinical Nutrition RD Inpatient Contact Info in Amion

## 2024-02-04 NOTE — NC FL2 (Signed)
 [place]  MEDICAID FL2 LEVEL OF CARE FORM     IDENTIFICATION  Patient Name: Amy Solis Birthdate: [birth year] Sex: female Admission Date (Current Location): 02/02/2024  [facility] and [place] Number:  Producer, television/film/video and Address:  [facility], [address], [place], [place] [address]      Provider Number: [id number]  Attending Physician Name and Address:  Rosario Leatrice FERNS, MD  Relative Name and Phone Number:  Lynn Silvan; [place]; [phone]    Current Level of Care: Hospital Recommended Level of Care: Memory Care, Assisted Living Facility Prior Approval Number:    Date Approved/Denied:   PASRR Number: [id number]  Discharge Plan: Other (Comment) (ALF/Memory Care)    Current Diagnoses: Patient Active Problem List   Diagnosis Date Noted   UTI (urinary tract infection) 02/02/2024   AKI (acute kidney injury) (HCC) 01/10/2022   Acute metabolic encephalopathy 01/10/2022   Pneumonia 10/26/2021   DNR (do not resuscitate) 10/26/2021   Hyperthyroidism 10/22/2021   Vascular dementia (HCC) 10/22/2021   COPD (chronic obstructive pulmonary disease) (HCC) 10/22/2021   Chronic respiratory failure with hypoxia (HCC) 10/22/2021   Essential hypertension 10/22/2021   Hypokalemia 10/22/2021   Toxic encephalopathy 10/21/2021   Acute encephalopathy 10/20/2021   AMS (altered mental status) 10/20/2021   Benzodiazepine causing adverse effect in therapeutic use 02/15/2019   Hypoxemic encephalopathy (HCC) 02/15/2019   Diastolic dysfunction 06/06/2016   Nocturnal hypoxemia 05/09/2016   Coronary artery calcification 05/09/2016   Family history of early CAD 05/09/2016   Dyspnea 03/09/2016    Orientation RESPIRATION BLADDER Height & Weight     Self  O2 (3L nasal cannula) Incontinent, External catheter Weight: 130 lb 1.1 oz (59 kg) Height:  5' (152.4 cm)  BEHAVIORAL SYMPTOMS/MOOD NEUROLOGICAL BOWEL NUTRITION STATUS      Incontinent Diet (Please see  discharge summary)  AMBULATORY STATUS COMMUNICATION OF NEEDS Skin   Extensive Assist Verbally Normal                       Personal Care Assistance Level of Assistance  Bathing, Feeding, Dressing Bathing Assistance: Maximum assistance Feeding assistance: Maximum assistance Dressing Assistance: Maximum assistance     Functional Limitations Info  Sight Sight Info: Impaired (R and L)        SPECIAL CARE FACTORS FREQUENCY                       Contractures Contractures Info: Not present    Additional Factors Info  Code Status, Allergies Code Status Info: DNR-LIMITED -Do Not Intubate/DNI Allergies Info: NKA           Current Medications (02/04/2024):  This is the current hospital active medication list Current Facility-Administered Medications  Medication Dose Route Frequency Provider Last Rate Last Admin   acetaminophen  (TYLENOL ) tablet 650 mg  650 mg Oral Q6H PRN Doutova, Anastassia, MD       Or   acetaminophen  (TYLENOL ) suppository 650 mg  650 mg Rectal Q6H PRN Doutova, Anastassia, MD       aspirin  chewable tablet 81 mg  81 mg Oral Daily Doutova, Anastassia, MD   81 mg at 02/04/24 0808   cefTRIAXone  (ROCEPHIN ) 1 g in sodium chloride  0.9 % 100 mL IVPB  1 g Intravenous Q24H Doutova, Anastassia, MD 200 mL/hr at 02/03/24 1723 1 g at 02/03/24 1723   feeding supplement (ENSURE PLUS HIGH PROTEIN) liquid 237 mL  237 mL Oral Daily Rosario Leatrice FERNS, MD  237 mL at 02/04/24 1422   fentaNYL  (SUBLIMAZE ) injection 12.5-50 mcg  12.5-50 mcg Intravenous Q2H PRN Doutova, Anastassia, MD       methimazole  (TAPAZOLE ) tablet 5 mg  5 mg Oral Daily Doutova, Anastassia, MD   5 mg at 02/04/24 0808   multivitamin with minerals tablet 1 tablet  1 tablet Oral Daily Rosario Eland I, MD   1 tablet at 02/04/24 1423   ondansetron  (ZOFRAN ) tablet 4 mg  4 mg Oral Q6H PRN Doutova, Anastassia, MD       Or   ondansetron  (ZOFRAN ) injection 4 mg  4 mg Intravenous Q6H PRN Doutova, Anastassia,  MD       oxybutynin  (DITROPAN -XL) 24 hr tablet 10 mg  10 mg Oral QHS Doutova, Anastassia, MD   10 mg at 02/03/24 2137   pantoprazole  (PROTONIX ) EC tablet 40 mg  40 mg Oral Daily Doutova, Anastassia, MD   40 mg at 02/04/24 9191   PARoxetine  (PAXIL ) tablet 40 mg  40 mg Oral Daily Doutova, Anastassia, MD   40 mg at 02/04/24 0808     Discharge Medications: Please see discharge summary for a list of discharge medications.  Relevant Imaging Results:  Relevant Lab Results:   Additional Information SS#: [id number]  Lauraine FORBES Saa, LCSW

## 2024-02-05 ENCOUNTER — Other Ambulatory Visit (HOSPITAL_COMMUNITY): Payer: Self-pay

## 2024-02-07 LAB — CULTURE, BLOOD (ROUTINE X 2)
Culture: NO GROWTH
Culture: NO GROWTH
Special Requests: ADEQUATE

## 2024-04-10 ENCOUNTER — Ambulatory Visit: Admitting: Podiatry

## 2024-04-15 ENCOUNTER — Ambulatory Visit: Admitting: Podiatry

## 2024-04-22 ENCOUNTER — Ambulatory Visit: Admitting: Podiatry

## 2024-06-10 ENCOUNTER — Ambulatory Visit: Admitting: Podiatry
